# Patient Record
Sex: Male | Born: 1985 | Race: White | Hispanic: No | Smoking: Never smoker
Health system: Southern US, Community
[De-identification: ages and names within clinical notes are randomized; demographics above are authoritative.]

## PROBLEM LIST (undated history)

## (undated) DIAGNOSIS — F32A Depression, unspecified: Secondary | ICD-10-CM

## (undated) DIAGNOSIS — Z8669 Personal history of other diseases of the nervous system and sense organs: Secondary | ICD-10-CM

## (undated) DIAGNOSIS — K602 Anal fissure, unspecified: Secondary | ICD-10-CM

## (undated) DIAGNOSIS — G4730 Sleep apnea, unspecified: Secondary | ICD-10-CM

## (undated) DIAGNOSIS — K8000 Calculus of gallbladder with acute cholecystitis without obstruction: Secondary | ICD-10-CM

## (undated) DIAGNOSIS — IMO0001 Reserved for inherently not codable concepts without codable children: Secondary | ICD-10-CM

## (undated) DIAGNOSIS — Z8679 Personal history of other diseases of the circulatory system: Secondary | ICD-10-CM

## (undated) DIAGNOSIS — F909 Attention-deficit hyperactivity disorder, unspecified type: Secondary | ICD-10-CM

## (undated) DIAGNOSIS — I10 Essential (primary) hypertension: Secondary | ICD-10-CM

## (undated) DIAGNOSIS — F419 Anxiety disorder, unspecified: Secondary | ICD-10-CM

## (undated) DIAGNOSIS — E291 Testicular hypofunction: Secondary | ICD-10-CM

## (undated) DIAGNOSIS — F1910 Other psychoactive substance abuse, uncomplicated: Secondary | ICD-10-CM

## (undated) DIAGNOSIS — R030 Elevated blood-pressure reading, without diagnosis of hypertension: Secondary | ICD-10-CM

## (undated) HISTORY — PX: CARPAL TUNNEL RELEASE: SHX101

## (undated) HISTORY — DX: Other psychoactive substance abuse, uncomplicated: F19.10

## (undated) HISTORY — DX: Calculus of gallbladder with acute cholecystitis without obstruction: K80.00

## (undated) HISTORY — PX: COSMETIC SURGERY: SHX468

## (undated) HISTORY — PX: OTHER SURGICAL HISTORY: SHX169

---
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
## 2011-08-13 ENCOUNTER — Inpatient Hospital Stay (HOSPITAL_COMMUNITY): Payer: Managed Care, Other (non HMO) | Admitting: Anesthesiology

## 2011-08-13 ENCOUNTER — Encounter (HOSPITAL_COMMUNITY): Payer: Self-pay | Admitting: Emergency Medicine

## 2011-08-13 ENCOUNTER — Other Ambulatory Visit (INDEPENDENT_AMBULATORY_CARE_PROVIDER_SITE_OTHER): Payer: Self-pay | Admitting: General Surgery

## 2011-08-13 ENCOUNTER — Encounter (HOSPITAL_COMMUNITY): Payer: Self-pay | Admitting: Anesthesiology

## 2011-08-13 ENCOUNTER — Emergency Department (HOSPITAL_COMMUNITY): Payer: Managed Care, Other (non HMO)

## 2011-08-13 ENCOUNTER — Encounter (HOSPITAL_COMMUNITY): Admission: EM | Disposition: A | Discharge status: Home / Self Care | Payer: Self-pay | Source: Home / Self Care

## 2011-08-13 ENCOUNTER — Inpatient Hospital Stay (HOSPITAL_COMMUNITY): Payer: Managed Care, Other (non HMO)

## 2011-08-13 ENCOUNTER — Inpatient Hospital Stay (HOSPITAL_COMMUNITY)
Admission: EM | Admit: 2011-08-13 | Discharge: 2011-08-15 | DRG: 419 | Disposition: A | Discharge status: Home / Self Care | Payer: Managed Care, Other (non HMO) | Attending: General Surgery | Admitting: General Surgery

## 2011-08-13 DIAGNOSIS — K8010 Calculus of gallbladder with chronic cholecystitis without obstruction: Secondary | ICD-10-CM

## 2011-08-13 DIAGNOSIS — R030 Elevated blood-pressure reading, without diagnosis of hypertension: Secondary | ICD-10-CM | POA: Diagnosis present

## 2011-08-13 DIAGNOSIS — K8000 Calculus of gallbladder with acute cholecystitis without obstruction: Secondary | ICD-10-CM

## 2011-08-13 DIAGNOSIS — E669 Obesity, unspecified: Secondary | ICD-10-CM | POA: Diagnosis present

## 2011-08-13 DIAGNOSIS — R1011 Right upper quadrant pain: Secondary | ICD-10-CM

## 2011-08-13 DIAGNOSIS — I10 Essential (primary) hypertension: Secondary | ICD-10-CM | POA: Diagnosis present

## 2011-08-13 DIAGNOSIS — D72828 Other elevated white blood cell count: Secondary | ICD-10-CM

## 2011-08-13 DIAGNOSIS — K8020 Calculus of gallbladder without cholecystitis without obstruction: Secondary | ICD-10-CM

## 2011-08-13 DIAGNOSIS — Z79899 Other long term (current) drug therapy: Secondary | ICD-10-CM

## 2011-08-13 DIAGNOSIS — R109 Unspecified abdominal pain: Secondary | ICD-10-CM

## 2011-08-13 HISTORY — DX: Elevated blood-pressure reading, without diagnosis of hypertension: R03.0

## 2011-08-13 HISTORY — DX: Calculus of gallbladder with acute cholecystitis without obstruction: K80.00

## 2011-08-13 HISTORY — DX: Reserved for inherently not codable concepts without codable children: IMO0001

## 2011-08-13 HISTORY — PX: CHOLECYSTECTOMY: SHX55

## 2011-08-13 LAB — CBC
HCT: 44.8 % (ref 39.0–52.0)
Hemoglobin: 15.8 g/dL (ref 13.0–17.0)
MCH: 30.6 pg (ref 26.0–34.0)
MCHC: 35.3 g/dL (ref 30.0–36.0)
MCV: 86.7 fL (ref 78.0–100.0)
Platelets: 259 10*3/uL (ref 150–400)
RBC: 5.17 MIL/uL (ref 4.22–5.81)
RDW: 12.4 % (ref 11.5–15.5)
WBC: 13.7 10*3/uL — ABNORMAL HIGH (ref 4.0–10.5)

## 2011-08-13 LAB — COMPREHENSIVE METABOLIC PANEL
ALT: 42 U/L (ref 0–53)
AST: 20 U/L (ref 0–37)
Albumin: 3.9 g/dL (ref 3.5–5.2)
Alkaline Phosphatase: 88 U/L (ref 39–117)
BUN: 9 mg/dL (ref 6–23)
CO2: 26 mEq/L (ref 19–32)
Calcium: 9.7 mg/dL (ref 8.4–10.5)
Chloride: 103 mEq/L (ref 96–112)
Creatinine, Ser: 0.82 mg/dL (ref 0.50–1.35)
GFR calc Af Amer: >90 mL/min (ref >90)
GFR calc non Af Amer: >90 mL/min (ref >90)
Glucose, Bld: 133 mg/dL — ABNORMAL HIGH (ref 70–99)
Potassium: 4.7 mEq/L (ref 3.5–5.1)
Sodium: 138 mEq/L (ref 135–145)
Total Bilirubin: 0.3 mg/dL (ref 0.3–1.2)
Total Protein: 7.3 g/dL (ref 6.0–8.3)

## 2011-08-13 LAB — DIFFERENTIAL
Basophils Absolute: 0 10*3/uL (ref 0.0–0.1)
Basophils Relative: 0 % (ref 0–1)
Eosinophils Absolute: 0.1 10*3/uL (ref 0.0–0.7)
Eosinophils Relative: 0 % (ref 0–5)
Lymphocytes Relative: 13 % (ref 12–46)
Lymphs Abs: 1.8 10*3/uL (ref 0.7–4.0)
Monocytes Absolute: 0.8 10*3/uL (ref 0.1–1.0)
Monocytes Relative: 6 % (ref 3–12)
Neutro Abs: 10.9 10*3/uL — ABNORMAL HIGH (ref 1.7–7.7)
Neutrophils Relative %: 80 % — ABNORMAL HIGH (ref 43–77)

## 2011-08-13 LAB — MRSA PCR SCREENING: MRSA by PCR: NEGATIVE

## 2011-08-13 SURGERY — LAPAROSCOPIC CHOLECYSTECTOMY WITH INTRAOPERATIVE CHOLANGIOGRAM
Anesthesia: General | Site: Abdomen | Wound class: Contaminated

## 2011-08-13 MED ORDER — HYDROMORPHONE HCL PF 1 MG/ML IJ SOLN
1.0000 mg | Freq: Once | INTRAMUSCULAR | Status: AC
  Administered 2011-08-13: 1 mg via INTRAVENOUS
  Filled 2011-08-13: qty 1

## 2011-08-13 MED ORDER — BUPIVACAINE HCL (PF) 0.25 % IJ SOLN
INTRAMUSCULAR | Status: DC | PRN
  Administered 2011-08-13: 30 mL

## 2011-08-13 MED ORDER — MORPHINE SULFATE 2 MG/ML IJ SOLN
2.0000 mg | INTRAMUSCULAR | Status: DC | PRN
  Administered 2011-08-13: 2 mg via INTRAVENOUS
  Administered 2011-08-13: 4 mg via INTRAVENOUS
  Administered 2011-08-13: 2 mg via INTRAVENOUS
  Administered 2011-08-13: 4 mg via INTRAVENOUS
  Filled 2011-08-13 (×2): qty 1
  Filled 2011-08-13 (×2): qty 2

## 2011-08-13 MED ORDER — NEOSTIGMINE METHYLSULFATE 1 MG/ML IJ SOLN
INTRAMUSCULAR | Status: DC | PRN
  Administered 2011-08-13: 2 mg via INTRAVENOUS

## 2011-08-13 MED ORDER — ONDANSETRON HCL 4 MG/2ML IJ SOLN: 4.0000 mg | Freq: Once | INTRAMUSCULAR | Status: DC | PRN

## 2011-08-13 MED ORDER — SODIUM CHLORIDE 0.9 % IV BOLUS (SEPSIS)
1000.0000 mL | Freq: Once | INTRAVENOUS | Status: AC
  Administered 2011-08-13: 1000 mL via INTRAVENOUS

## 2011-08-13 MED ORDER — FENTANYL CITRATE 0.05 MG/ML IJ SOLN
INTRAMUSCULAR | Status: DC | PRN
  Administered 2011-08-13: 50 ug via INTRAVENOUS
  Administered 2011-08-13 (×2): 100 ug via INTRAVENOUS
  Administered 2011-08-13: 50 ug via INTRAVENOUS
  Administered 2011-08-13: 100 ug via INTRAVENOUS

## 2011-08-13 MED ORDER — ONDANSETRON HCL 4 MG/2ML IJ SOLN
4.0000 mg | Freq: Once | INTRAMUSCULAR | Status: AC
  Administered 2011-08-13: 4 mg via INTRAVENOUS
  Filled 2011-08-13: qty 2

## 2011-08-13 MED ORDER — PIPERACILLIN-TAZOBACTAM 3.375 G IVPB
3.3750 g | Freq: Three times a day (TID) | INTRAVENOUS | Status: DC
  Administered 2011-08-13 (×2): 3.375 g via INTRAVENOUS
  Filled 2011-08-13 (×5): qty 50

## 2011-08-13 MED ORDER — IOHEXOL 300 MG/ML  SOLN
INTRAMUSCULAR | Status: DC | PRN
  Administered 2011-08-13: 50 mL via INTRAVENOUS

## 2011-08-13 MED ORDER — LIDOCAINE-EPINEPHRINE 1 %-1:100000 IJ SOLN
INTRAMUSCULAR | Status: DC | PRN
  Administered 2011-08-13: 30 mL

## 2011-08-13 MED ORDER — PANTOPRAZOLE SODIUM 40 MG IV SOLR
40.0000 mg | Freq: Every day | INTRAVENOUS | Status: DC
  Administered 2011-08-13 – 2011-08-14 (×2): 40 mg via INTRAVENOUS
  Filled 2011-08-13 (×3): qty 40

## 2011-08-13 MED ORDER — HYDROMORPHONE HCL PF 1 MG/ML IJ SOLN: 0.2500 mg | INTRAMUSCULAR | Status: DC | PRN

## 2011-08-13 MED ORDER — SUCCINYLCHOLINE CHLORIDE 20 MG/ML IJ SOLN
INTRAMUSCULAR | Status: DC | PRN
  Administered 2011-08-13: 100 mg via INTRAVENOUS

## 2011-08-13 MED ORDER — HYDROCODONE-ACETAMINOPHEN 5-325 MG PO TABS
1.0000 | ORAL_TABLET | ORAL | Status: DC | PRN
  Administered 2011-08-13 – 2011-08-14 (×3): 2 via ORAL
  Filled 2011-08-13 (×3): qty 2

## 2011-08-13 MED ORDER — LACTATED RINGERS IV SOLN
INTRAVENOUS | Status: DC | PRN
  Administered 2011-08-13: 13:00:00 via INTRAVENOUS

## 2011-08-13 MED ORDER — MORPHINE SULFATE 10 MG/ML IJ SOLN
INTRAMUSCULAR | Status: DC | PRN
  Administered 2011-08-13 (×2): 5 mg via INTRAVENOUS

## 2011-08-13 MED ORDER — BIOTENE DRY MOUTH MT LIQD
15.0000 mL | Freq: Two times a day (BID) | OROMUCOSAL | Status: DC
  Administered 2011-08-13: 15 mL via OROMUCOSAL

## 2011-08-13 MED ORDER — 0.9 % SODIUM CHLORIDE (POUR BTL) OPTIME
TOPICAL | Status: DC | PRN
  Administered 2011-08-13: 1000 mL

## 2011-08-13 MED ORDER — ROCURONIUM BROMIDE 100 MG/10ML IV SOLN
INTRAVENOUS | Status: DC | PRN
  Administered 2011-08-13: 50 mg via INTRAVENOUS

## 2011-08-13 MED ORDER — MIDAZOLAM HCL 5 MG/5ML IJ SOLN
INTRAMUSCULAR | Status: DC | PRN
  Administered 2011-08-13: 2 mg via INTRAVENOUS

## 2011-08-13 MED ORDER — HEPARIN SODIUM (PORCINE) 5000 UNIT/ML IJ SOLN
5000.0000 [IU] | Freq: Three times a day (TID) | INTRAMUSCULAR | Status: DC
  Administered 2011-08-13 – 2011-08-15 (×5): 5000 [IU] via SUBCUTANEOUS
  Filled 2011-08-13 (×8): qty 1

## 2011-08-13 MED ORDER — ONDANSETRON HCL 4 MG/2ML IJ SOLN
4.0000 mg | Freq: Four times a day (QID) | INTRAMUSCULAR | Status: DC | PRN
  Administered 2011-08-13: 4 mg via INTRAVENOUS
  Filled 2011-08-13: qty 2

## 2011-08-13 MED ORDER — PROPOFOL 10 MG/ML IV EMUL
INTRAVENOUS | Status: DC | PRN
  Administered 2011-08-13: 300 mg via INTRAVENOUS

## 2011-08-13 MED ORDER — ONDANSETRON HCL 4 MG/2ML IJ SOLN
INTRAMUSCULAR | Status: DC | PRN
  Administered 2011-08-13: 4 mg via INTRAVENOUS

## 2011-08-13 MED ORDER — SODIUM CHLORIDE 0.9 % IV SOLN
1.0000 g | Freq: Once | INTRAVENOUS | Status: AC
  Administered 2011-08-13: 1 g via INTRAVENOUS
  Filled 2011-08-13: qty 1

## 2011-08-13 MED ORDER — LACTATED RINGERS IV SOLN
INTRAVENOUS | Status: DC
  Administered 2011-08-13: 12:00:00 via INTRAVENOUS

## 2011-08-13 MED ORDER — GLYCOPYRROLATE 0.2 MG/ML IJ SOLN
INTRAMUSCULAR | Status: DC | PRN
  Administered 2011-08-13: 0.2 mg via INTRAVENOUS

## 2011-08-13 MED ORDER — SODIUM CHLORIDE 0.9 % IR SOLN
Status: DC | PRN
  Administered 2011-08-13: 1000 mL

## 2011-08-13 MED ORDER — KCL IN DEXTROSE-NACL 20-5-0.45 MEQ/L-%-% IV SOLN
INTRAVENOUS | Status: DC
  Administered 2011-08-13 – 2011-08-15 (×4): via INTRAVENOUS
  Filled 2011-08-13 (×6): qty 1000

## 2011-08-13 MED ORDER — CHLORHEXIDINE GLUCONATE 0.12 % MT SOLN
15.0000 mL | Freq: Two times a day (BID) | OROMUCOSAL | Status: DC
  Administered 2011-08-13: 15 mL via OROMUCOSAL

## 2011-08-13 SURGICAL SUPPLY — 53 items
APPLIER CLIP ROT 10 11.4 M/L (STAPLE) ×2
BLADE SURG ROTATE 9660 (MISCELLANEOUS) IMPLANT
CANISTER SUCTION 2500CC (MISCELLANEOUS) ×2 IMPLANT
CATH REDDICK CHOLANGI 4FR 50CM (CATHETERS) ×2 IMPLANT
CHLORAPREP W/TINT 26ML (MISCELLANEOUS) ×2 IMPLANT
CLIP APPLIE ROT 10 11.4 M/L (STAPLE) ×1 IMPLANT
CLOTH BEACON ORANGE TIMEOUT ST (SAFETY) ×2 IMPLANT
COVER SURGICAL LIGHT HANDLE (MISCELLANEOUS) ×2 IMPLANT
DECANTER SPIKE VIAL GLASS SM (MISCELLANEOUS) ×4 IMPLANT
DERMABOND ADVANCED (GAUZE/BANDAGES/DRESSINGS) ×1
DERMABOND ADVANCED .7 DNX12 (GAUZE/BANDAGES/DRESSINGS) ×1 IMPLANT
DRAIN CHANNEL 19F RND (DRAIN) ×2 IMPLANT
DRAPE C-ARM 42X72 X-RAY (DRAPES) ×2 IMPLANT
ELECT CAUTERY BLADE 6.4 (BLADE) ×2 IMPLANT
ELECT REM PT RETURN 9FT ADLT (ELECTROSURGICAL) ×2
ELECTRODE REM PT RTRN 9FT ADLT (ELECTROSURGICAL) ×1 IMPLANT
ENDOLOOP SUT PDS II  0 18 (SUTURE) ×1
ENDOLOOP SUT PDS II 0 18 (SUTURE) ×1 IMPLANT
EVACUATOR SILICONE 100CC (DRAIN) ×2 IMPLANT
GLOVE BIO SURGEON STRL SZ7.5 (GLOVE) ×2 IMPLANT
GLOVE BIOGEL PI IND STRL 7.0 (GLOVE) ×1 IMPLANT
GLOVE BIOGEL PI IND STRL 7.5 (GLOVE) ×1 IMPLANT
GLOVE BIOGEL PI IND STRL 8 (GLOVE) ×1 IMPLANT
GLOVE BIOGEL PI INDICATOR 7.0 (GLOVE) ×1
GLOVE BIOGEL PI INDICATOR 7.5 (GLOVE) ×1
GLOVE BIOGEL PI INDICATOR 8 (GLOVE) ×1
GLOVE SS BIOGEL STRL SZ 8 (GLOVE) ×1 IMPLANT
GLOVE SUPERSENSE BIOGEL SZ 8 (GLOVE) ×1
GLOVE SURG SS PI 6.5 STRL IVOR (GLOVE) ×2 IMPLANT
GLOVE SURG SS PI 7.5 STRL IVOR (GLOVE) ×4 IMPLANT
GOWN PREVENTION PLUS XLARGE (GOWN DISPOSABLE) ×4 IMPLANT
GOWN STRL NON-REIN LRG LVL3 (GOWN DISPOSABLE) ×4 IMPLANT
IV CATH 14GX2 1/4 (CATHETERS) ×2 IMPLANT
KIT BASIN OR (CUSTOM PROCEDURE TRAY) ×2 IMPLANT
KIT ROOM TURNOVER OR (KITS) ×2 IMPLANT
NS IRRIG 1000ML POUR BTL (IV SOLUTION) ×2 IMPLANT
PAD ARMBOARD 7.5X6 YLW CONV (MISCELLANEOUS) ×2 IMPLANT
PENCIL BUTTON HOLSTER BLD 10FT (ELECTRODE) ×2 IMPLANT
POUCH SPECIMEN RETRIEVAL 10MM (ENDOMECHANICALS) ×2 IMPLANT
SCISSORS LAP 5X35 DISP (ENDOMECHANICALS) IMPLANT
SET IRRIG TUBING LAPAROSCOPIC (IRRIGATION / IRRIGATOR) ×2 IMPLANT
SLEEVE ENDOPATH XCEL 5M (ENDOMECHANICALS) ×2 IMPLANT
SPECIMEN JAR SMALL (MISCELLANEOUS) ×2 IMPLANT
SUT ETHILON 2 0 FS 18 (SUTURE) ×2 IMPLANT
SUT MNCRL AB 4-0 PS2 18 (SUTURE) ×4 IMPLANT
SUT VICRYL 0 UR6 27IN ABS (SUTURE) ×6 IMPLANT
TOWEL OR 17X24 6PK STRL BLUE (TOWEL DISPOSABLE) ×2 IMPLANT
TOWEL OR 17X26 10 PK STRL BLUE (TOWEL DISPOSABLE) ×2 IMPLANT
TRAY FOLEY CATH 14FR (SET/KITS/TRAYS/PACK) IMPLANT
TRAY LAPAROSCOPIC (CUSTOM PROCEDURE TRAY) ×2 IMPLANT
TROCAR HASSON GELL 12X100 (TROCAR) ×2 IMPLANT
TROCAR XCEL NON-BLD 11X100MML (ENDOMECHANICALS) ×2 IMPLANT
TROCAR XCEL NON-BLD 5MMX100MML (ENDOMECHANICALS) ×2 IMPLANT

## 2011-08-13 NOTE — Op Note (Signed)
NAMEMarland Kitchen  KEYSHAWN, HELLWIG NO.:  [id number]  MEDICAL RECORD NO.:  [id number]  LOCATION:  5156                         FACILITY:  [facility]  PHYSICIAN:  Lodema Pilot, MD       DATE OF BIRTH:  1986/03/10  DATE OF PROCEDURE:  08/13/2011 DATE OF DISCHARGE:                              OPERATIVE REPORT   PROCEDURE:  Laparoscopic cholecystectomy with intraoperative cholangiogram and drain placement.  PREOPERATIVE DIAGNOSIS:  Acute cholecystitis.  POSTOP DIAGNOSIS:  Acute cholecystitis.  SURGEON:  Lodema Pilot, MD  ASSISTANT:  Brayton El, PA-C  ANESTHESIA:  General endotracheal anesthesia with 26 mL of 1% lidocaine with epinephrine and 0.25% Marcaine in a 50:50 mixture.  FLUIDS:  1800 mL crystalloid.  ESTIMATED BLOOD LOSS:  75 mL.  URINE OUTPUT:  450 mL.  DRAINS:  A 19-French Blake drain placed in the gallbladder fossa.  SPECIMENS:  Gallbladder and contents sent to Pathology for permanent sectioning.  COMPLICATIONS:  None apparent.  FINDINGS:  Edematous and injected gallbladder consistent with acute cholecystitis.  Large gallstones, normal cholangiogram.  Endoloop placed around the cystic duct stump, and JP drain placed in the gallbladder fossa.  INDICATION FOR PROCEDURE:  Mr. Retana is a 26 year old male with 2-day history of right upper quadrant abdominal pain and gallstones and elevated white blood cell count and symptoms concerning for acute cholecystitis.  OPERATIVE DETAILS:  Mr. Taboada was seen and evaluated in the emergency room, and risks and benefits of the procedure were discussed in lay terms.  Informed consent was obtained, and therapeutic antibiotics were given and was taken to the operating room, placed on table in supine position.  General endotracheal anesthesia was obtained, and his abdomen was prepped and draped in standard surgical fashion.  A Foley catheter was placed, and procedure time-out was performed with all operative  team members to confirm proper patient and procedure, and then supraumbilical midline incision was made in the skin and dissection carried down to the abdominal wall fascia and into the peritoneum using blunt dissection, suture was placed to the fascia, and 12-mm Hasson port was placed. Pneumoperitoneum was obtained, and laparoscope was introduced.  There was no evidence of bowel injury upon entry.  He had a very distended and thickened gallbladder, and could not grasp the gallbladder, and so we aspirated with a needle aspirator, aspirated some of the fluid from the gallbladder, and we were able to retract the gallbladder cephalad, and they were draped with a very thick rind and thick adhesions with omentum adhered to the gallbladder.  These were taken down using blunt dissection.  The duodenum was closed, but no cautery was used in this area.  A window was created through the triangle of Calot, and the cystic duct stump was skeletonized.  We dissected this high up on the gallbladder and the cystic duct was very wide and dilated this area.  I left the grasper and placed on the gallbladder side who made a small cystic ductotomy and placed a Reddick catheter into this little ductotomy, and cholangiogram was performed, which demonstrated, but this was indeed the cystic duct and gallbladder, and he had no common bile duct stones and free flow of bile into  the duodenum.  Normal right and left hepatic ducts.  The catheter was removed, and the duct was transected and Endoloop was placed on the gallbladder side and another 0 PDS Endoloop was placed on the cystic duct stump.  Another clip was placed in this area as well marking the Endoloop, but the main otomies were cystic duct stump closure.  Then, the gallbladder was divided and removed from the gallbladder fossa using Bovie electrocautery.  He had a very thick rind, and there was some spillage of bile during the procedure, although there are  no gallstones apparently spilt.  The gallbladder was completely removed from the gallbladder fossa and placed in an EndoCatch bag and removed from the umbilical incision after enlarging the fascial incision to accommodate the large stone.  The gallbladder had some large palpable stones in it, and was sent to Pathology for permanent sectioning.  The trocar was placed back at the umbilicus and right upper quadrant was irrigated with sterile saline solution till the irrigation returned clear.  Gallbladder fossa was inspected for hemostasis, which was noted to be adequate.  The Endoloop and clips were placed in there were noted to be in good position.  The cystic artery clips were in good position, and because of the large cystic duct and an Endoloop placed around this and decided to leave a 45- [place] Blake drain in the gallbladder fossa.  This was removed through the right upper quadrant trocar site and sutured in place with a nylon drain stitch.  The umbilical fascia was approximated with interrupted 0 Vicryl sutures in open fashion, and these were secured and the abdomen re-insufflated with carbon dioxide gas and umbilical closure was noted to be adequate without any evidence of bowel injury.  Right upper quadrant was noted be adequate without any evidence of bowel injury or bile leakage or bleeding.  The right upper quadrant trocar was removed under direct visualization.  Abdominal wall was noted to be hemostatic. The epigastric trocar was removed, and the wounds were injected with a total of 26 mL of 1% lidocaine with epinephrine, 0.25% Marcaine in a 50:50 mixture.  Skin edges were approximated with 4-0 Monocryl subcuticular suture.  Skin was washed and dried and Dermabond was applied.  All sponge and instrument counts were correct at the end of the case.  The patient tolerated the procedure well without apparent complication.          ______________________________ Lodema Pilot,  MD     BL/MEDQ  D:  08/13/2011  T:  08/13/2011  Job:  [id number]

## 2011-08-13 NOTE — ED Notes (Signed)
Surgeon at bedside to speak with pt.

## 2011-08-13 NOTE — ED Provider Notes (Addendum)
History     CSN: [id number]  Arrival date & time 08/13/11  4098   First MD Initiated Contact with Patient 08/13/11 [phone]      Chief Complaint  Patient presents with  . Abdominal Pain    (Consider location/radiation/quality/duration/timing/severity/associated sxs/prior treatment) Patient is a 26 y.o. male presenting with abdominal pain. The history is provided by the patient.  Abdominal Pain The primary symptoms of the illness include abdominal pain and nausea. The primary symptoms of the illness do not include fever, shortness of breath, vomiting or diarrhea. The current episode started 6 to 12 hours ago. The onset of the illness was sudden. The problem has been rapidly worsening.  The abdominal pain has been rapidly worsening since its onset. The abdominal pain is located in the RUQ. Pain radiation: upper back. The severity of the abdominal pain is 9/10. The abdominal pain is relieved by nothing. The abdominal pain is exacerbated by eating and coughing.  Associated with: started after eating spaghetti. Additional symptoms associated with the illness include chills, anorexia and diaphoresis. Significant associated medical issues include gallstones.    No past medical history on file.  No past surgical history on file.  No family history on file.  History  Substance Use Topics  . Smoking status: Not on file  . Smokeless tobacco: Not on file  . Alcohol Use: Not on file      Review of Systems  Constitutional: Positive for chills and diaphoresis. Negative for fever.  Respiratory: Negative for shortness of breath.   Gastrointestinal: Positive for nausea, abdominal pain and anorexia. Negative for vomiting and diarrhea.  All other systems reviewed and are negative.    Allergies  Review of patient's allergies indicates not on file.  Home Medications  No current outpatient prescriptions on file.  BP 160/85  Pulse 91  Temp(Src) 97.4 F (36.3 C) (Oral)  Resp 16  SpO2  99%  Physical Exam  Nursing note and vitals reviewed. Constitutional: He is oriented to person, place, and time. He appears well-developed and well-nourished. No distress.  HENT:  Head: Normocephalic and atraumatic.  Mouth/Throat: Oropharynx is clear and moist.  Eyes: Conjunctivae and EOM are normal. Pupils are equal, round, and reactive to light.  Neck: Normal range of motion. Neck supple.  Cardiovascular: Normal rate, regular rhythm and intact distal pulses.   No murmur heard. Pulmonary/Chest: Effort normal and breath sounds normal. No respiratory distress. He has no wheezes. He has no rales.  Abdominal: Soft. Bowel sounds are normal. He exhibits no distension. There is tenderness in the right upper quadrant. There is guarding and positive Murphy's sign. There is no rebound and no CVA tenderness.  Musculoskeletal: Normal range of motion. He exhibits no edema and no tenderness.  Neurological: He is alert and oriented to person, place, and time.  Skin: Skin is warm. No rash noted. He is diaphoretic. No erythema.  Psychiatric: He has a normal mood and affect. His behavior is normal.    ED Course  Procedures (including critical care time)  Labs Reviewed  CBC - Abnormal; Notable for the following:    WBC 13.7 (*)    All other components within normal limits  DIFFERENTIAL - Abnormal; Notable for the following:    Neutrophils Relative 80 (*)    Neutro Abs 10.9 (*)    All other components within normal limits  COMPREHENSIVE METABOLIC PANEL - Abnormal; Notable for the following:    Glucose, Bld 133 (*)    All other components within normal  limits   US Abdomen Complete  08/13/2011  *RADIOLOGY REPORT*  Clinical Data:  Abdominal pain.  ABDOMINAL ULTRASOUND COMPLETE  Comparison:  None  Findings:  Gallbladder:  A 1.9 cm stone is noted at the fundus of the gallbladder.  The gallbladder appears distended.  No gallbladder wall thickening or pericholecystic fluid is seen.  No ultrasonographic  Murphy's sign is elicited.  Common Bile Duct:  0.8 cm in diameter; mildly prominent in caliber; a distal obstructing stone cannot be excluded.  Liver:  Mildly increased parenchymal echogenicity and coarsened echotexture, compatible with fatty infiltration; no focal lesions identified.  Limited Doppler evaluation demonstrates normal blood flow within the liver.  IVC:  Unremarkable in appearance.  Pancreas:  Although the pancreas is difficult to visualize in its entirety due to overlying bowel gas, no focal pancreatic abnormality is identified.  Spleen:  10.3 cm in length; within normal limits in size and echotexture.  Right kidney:  11.5 cm in length; normal in size, configuration and parenchymal echogenicity.  No evidence of mass or hydronephrosis.  Left kidney:  12.5 cm in length; normal in size, configuration and parenchymal echogenicity.  No evidence of mass or hydronephrosis.  Abdominal Aorta:  Normal in caliber; no aneurysm identified.  IMPRESSION:  1.  Distended gallbladder noted; common hepatic duct mildly distended, measuring 0.8 cm.  Distal obstruction cannot be excluded, though no ultrasonographic Murphy's sign is elicited. 2.  Cholelithiasis; no evidence for cholecystitis. 3.  Diffuse fatty infiltration within the liver.  Original Report Authenticated By: Tonia Ghent, M.D.     1. Cholelithiasis   2. Abdominal  pain, other specified site       MDM   Patient with abrupt onset of abdominal pain today after eating with a past history of gallstones. Concern for cholecystitis. Patient has a positive Murphy sign and severe right upper quadrant pain. Vital signs are stable and patient is otherwise healthy. CBC, CMP, ultrasound and  5:18 AM Pt still hurting now a 6/10 down from 8/10.  More medication given.  Leukocytosis of 13.7 but otherwise normal labs. U/s with evidence of gallstone but no cholecystitis but can't r/o distal obstruction.    6:04 AM Reevaluation the patient's pain is down to  3-4/10 but is still tender with palpation. Will discuss patient's case with surgery.    Gwyneth Sprout, MD 08/13/11 1610  Gwyneth Sprout, MD 08/13/11 [id number]  Gwyneth Sprout, MD 08/13/11 [phone]

## 2011-08-13 NOTE — Brief Op Note (Signed)
08/13/2011  3:19 PM  PATIENT:  John Faulkner  26 y.o. male  PRE-OPERATIVE DIAGNOSIS:  Gallstones.  POST-OPERATIVE DIAGNOSIS:  Gallstones.  PROCEDURE:  Procedure(s) (LRB): LAPAROSCOPIC CHOLECYSTECTOMY WITH INTRAOPERATIVE CHOLANGIOGRAM (N/A)  SURGEON:  Surgeon(s) and Role:    * Rulon Abide, DO - Primary  PHYSICIAN ASSISTANT:   ASSISTANTS: kevin bruning   ANESTHESIA:   general  EBL:  Total I/O In: 1800 [I.V.:1800] Out: 525 [Urine:450; Blood:75]  BLOOD ADMINISTERED:none  DRAINS: (72F) Jackson-Pratt drain(s) with closed bulb suction in the GB fossa   LOCAL MEDICATIONS USED:  MARCAINE    and LIDOCAINE   SPECIMEN:  Source of Specimen:  gallbladder  DISPOSITION OF SPECIMEN:  PATHOLOGY  COUNTS:  YES  TOURNIQUET:  * No tourniquets in log *  DICTATION: .Other Dictation: Dictation Number [id number]  PLAN OF CARE: Admit to inpatient   PATIENT DISPOSITION:  PACU - hemodynamically stable.   Delay start of Pharmacological VTE agent (>24hrs) due to surgical blood loss or risk of bleeding: no

## 2011-08-13 NOTE — ED Notes (Signed)
Report received, assumed care.  

## 2011-08-13 NOTE — Anesthesia Procedure Notes (Signed)
Procedure Name: Intubation Date/Time: 08/13/2011 1:01 PM Performed by: Leona Singleton A. Patient Re-evaluated:Patient Re-evaluated prior to inductionOxygen Delivery Method: Circle System Utilized Preoxygenation: Pre-oxygenation with 100% oxygen Intubation Type: IV induction, Rapid sequence and Cricoid Pressure applied Ventilation: Mask ventilation without difficulty Laryngoscope Size: Miller and 2 Grade View: Grade I Tube type: Oral Airway Equipment and Method: stylet Placement Confirmation: ETT inserted through vocal cords under direct vision,  positive ETCO2 and breath sounds checked- equal and bilateral Secured at: 22 cm Tube secured with: Tape Dental Injury: Teeth and Oropharynx as per pre-operative assessment

## 2011-08-13 NOTE — Preoperative (Signed)
Beta Blockers   Reason not to administer Beta Blockers:Not Applicable 

## 2011-08-13 NOTE — Progress Notes (Signed)
UR of chart completed.  

## 2011-08-13 NOTE — Plan of Care (Signed)
Problem: Diagnosis - Type of Surgery Goal: General Surgical Patient Education (See Patient Education module for education specifics) Laparoscopic Cholecystectomy

## 2011-08-13 NOTE — H&P (Addendum)
John Faulkner is an 26 y.o. male.   Chief Complaint: right upper quadrant HPI: 26 year old obese Caucasian male presents to the emergency room complaining of abdominal pain. He states that the pain started at 7 PM last night. The pain has been constant since that time. The pain initially started in his epigastric region and radiated to his right upper quadrant. It also extends to his back. The patient states that he had a similar episode in February 2012 prompting him to the emergency room where he was diagnosed with gallstones. He reports some subjective fevers and chills. He did have some nausea yesterday. He denies any weight loss, jaundice, diarrhea, constipation, melena, hematochezia. He states that if he is not moving the pain is about a 4/10; however, if he moves the pain is very intense. He denies any acholic stools. He denies any difficulty swallowing  Past Medical History  Diagnosis Date  . Elevated blood pressure     History reviewed. No pertinent past surgical history.  History reviewed. No pertinent family history. Social History:  reports that he has never smoked. He does not have any smokeless tobacco history on file. He reports that he does not drink alcohol or use illicit drugs.  Allergies: No Known Allergies  Medications Prior to Admission  Medication Dose Route Frequency Provider Last Rate Last Dose  . HYDROmorphone (DILAUDID) injection 1 mg  1 mg Intravenous Once Gwyneth Sprout, MD   1 mg at 08/13/11 0344  . HYDROmorphone (DILAUDID) injection 1 mg  1 mg Intravenous Once Gwyneth Sprout, MD   1 mg at 08/13/11 0525  . ondansetron (ZOFRAN) injection 4 mg  4 mg Intravenous Once Gwyneth Sprout, MD   4 mg at 08/13/11 0344  . ondansetron (ZOFRAN) injection 4 mg  4 mg Intravenous Once Gwyneth Sprout, MD   4 mg at 08/13/11 0525  . sodium chloride 0.9 % bolus 1,000 mL  1,000 mL Intravenous Once Gwyneth Sprout, MD   1,000 mL at 08/13/11 0350   No current outpatient  prescriptions on file as of 08/13/2011.    Results for orders placed during the hospital encounter of 08/13/11 (from the past 48 hour(s))  CBC     Status: Abnormal   Collection Time   08/13/11  3:31 AM      Component Value Range Comment   WBC 13.7 (*) 4.0 - 10.5 (K/uL)    RBC 5.17  4.22 - 5.81 (MIL/uL)    Hemoglobin 15.8  13.0 - 17.0 (g/dL)    HCT 78.4  69.6 - 29.5 (%)    MCV 86.7  78.0 - 100.0 (fL)    MCH 30.6  26.0 - 34.0 (pg)    MCHC 35.3  30.0 - 36.0 (g/dL)    RDW 28.4  13.2 - 44.0 (%)    Platelets 259  150 - 400 (K/uL)   DIFFERENTIAL     Status: Abnormal   Collection Time   08/13/11  3:31 AM      Component Value Range Comment   Neutrophils Relative 80 (*) 43 - 77 (%)    Neutro Abs 10.9 (*) 1.7 - 7.7 (K/uL)    Lymphocytes Relative 13  12 - 46 (%)    Lymphs Abs 1.8  0.7 - 4.0 (K/uL)    Monocytes Relative 6  3 - 12 (%)    Monocytes Absolute 0.8  0.1 - 1.0 (K/uL)    Eosinophils Relative 0  0 - 5 (%)    Eosinophils Absolute 0.1  0.0 -  0.7 (K/uL)    Basophils Relative 0  0 - 1 (%)    Basophils Absolute 0.0  0.0 - 0.1 (K/uL)   COMPREHENSIVE METABOLIC PANEL     Status: Abnormal   Collection Time   08/13/11  3:31 AM      Component Value Range Comment   Sodium 138  135 - 145 (mEq/L)    Potassium 4.7  3.5 - 5.1 (mEq/L)    Chloride 103  96 - 112 (mEq/L)    CO2 26  19 - 32 (mEq/L)    Glucose, Bld 133 (*) 70 - 99 (mg/dL)    BUN 9  6 - 23 (mg/dL)    Creatinine, Ser 1.61  0.50 - 1.35 (mg/dL)    Calcium 9.7  8.4 - 10.5 (mg/dL)    Total Protein 7.3  6.0 - 8.3 (g/dL)    Albumin 3.9  3.5 - 5.2 (g/dL)    AST 20  0 - 37 (U/L)    ALT 42  0 - 53 (U/L)    Alkaline Phosphatase 88  39 - 117 (U/L)    Total Bilirubin 0.3  0.3 - 1.2 (mg/dL)    GFR calc non Af Amer >90  >90 (mL/min)    GFR calc Af Amer >90  >90 (mL/min)    US Abdomen Complete  08/13/2011  *RADIOLOGY REPORT*  Clinical Data:  Abdominal pain.  ABDOMINAL ULTRASOUND COMPLETE  Comparison:  None  Findings:  Gallbladder:  A 1.9 cm  stone is noted at the fundus of the gallbladder.  The gallbladder appears distended.  No gallbladder wall thickening or pericholecystic fluid is seen.  No ultrasonographic Murphy's sign is elicited.  Common Bile Duct:  0.8 cm in diameter; mildly prominent in caliber; a distal obstructing stone cannot be excluded.  Liver:  Mildly increased parenchymal echogenicity and coarsened echotexture, compatible with fatty infiltration; no focal lesions identified.  Limited Doppler evaluation demonstrates normal blood flow within the liver.  IVC:  Unremarkable in appearance.  Pancreas:  Although the pancreas is difficult to visualize in its entirety due to overlying bowel gas, no focal pancreatic abnormality is identified.  Spleen:  10.3 cm in length; within normal limits in size and echotexture.  Right kidney:  11.5 cm in length; normal in size, configuration and parenchymal echogenicity.  No evidence of mass or hydronephrosis.  Left kidney:  12.5 cm in length; normal in size, configuration and parenchymal echogenicity.  No evidence of mass or hydronephrosis.  Abdominal Aorta:  Normal in caliber; no aneurysm identified.  IMPRESSION:  1.  Distended gallbladder noted; common hepatic duct mildly distended, measuring 0.8 cm.  Distal obstruction cannot be excluded, though no ultrasonographic Murphy's sign is elicited. 2.  Cholelithiasis; no evidence for cholecystitis. 3.  Diffuse fatty infiltration within the liver.  Original Report Authenticated By: Tonia Ghent, M.D.    Review of Systems  Constitutional: Positive for fever and chills. Negative for weight loss and diaphoresis.  HENT: Negative for hearing loss.   Eyes: Negative for double vision and pain.  Respiratory: Negative for shortness of breath.   Cardiovascular: Negative for chest pain, palpitations, orthopnea and leg swelling.  Gastrointestinal: Positive for nausea and abdominal pain. Negative for vomiting, diarrhea, constipation, blood in stool and melena.    Genitourinary: Negative for dysuria, urgency and frequency.  Musculoskeletal: Negative.   Skin: Negative for itching and rash.  Neurological: Negative.  Negative for weakness and headaches.  Endo/Heme/Allergies: Negative.   Psychiatric/Behavioral: Negative.     Blood pressure 133/69,  pulse 63, temperature 97.4 F (36.3 C), temperature source Oral, resp. rate 16, height 6\' 1"  (1.854 m), weight 290 lb (131.543 kg), SpO2 96.00%. Physical Exam  Vitals reviewed. Constitutional: He appears well-developed and well-nourished. He is cooperative. He appears ill.       obese  HENT:  Head: Normocephalic and atraumatic.  Right Ear: External ear normal.  Left Ear: External ear normal.  Nose: Nose normal.  Eyes: Conjunctivae are normal. No scleral icterus.  Neck: Normal range of motion. Neck supple. No tracheal deviation present. No thyromegaly present.  Cardiovascular: Normal rate, regular rhythm and normal heart sounds.   Respiratory: Effort normal and breath sounds normal. No respiratory distress. He has no wheezes.  GI: Soft. Bowel sounds are normal. He exhibits no distension. There is tenderness in the right upper quadrant and epigastric area. There is positive Murphy's sign. There is no rigidity, no rebound and no guarding.  Musculoskeletal: Normal range of motion. He exhibits no edema and no tenderness.  Lymphadenopathy:    He has no cervical adenopathy.  Neurological: He is alert. He exhibits normal muscle tone.  Skin: Skin is warm and dry. No rash noted. No erythema.  Psychiatric: He has a normal mood and affect. His behavior is normal. Judgment and thought content normal.     Assessment/Plan Obesity Elevated blood pressure Symptomatic cholelithiasis probable acute cholecystitis  rec admit, npo, ivf, iv pain meds, iv abx for cholecystitis.  Cholecystectomy later today by Dr Biagio Quint  We discussed gallbladder disease. The patient was given Agricultural engineer. We discussed  non-operative and operative management.   I discussed laparoscopic cholecystectomy in detail.  The patient was given diagrams detailing the procedure.  We discussed the risks and benefits of a laparoscopic cholecystectomy including, but not limited to bleeding, infection, injury to surrounding structures such as the intestine or liver, bile leak, retained gallstones, need to convert to an open procedure, prolonged diarrhea, blood clots such as  DVT, common bile duct injury, anesthesia risks, and possible need for additional procedures.  We discussed the typical post-operative recovery course. I explained that the likelihood of improvement of their symptoms is good.  Mary Sella. Andrey Campanile, MD, FACS General, Bariatric, & Minimally Invasive Surgery [facility], [place]    [facility] M 08/13/2011, 7:01 AM  Pt seen and examined in ER and in preop area.  I agree with Dr. Andrey Campanile that this is most likely acute cholecystitis.  The risks of infection, bleeding, pain, persistent symptoms, scarring, injury to bowel or bile ducts, retained stone, diarrhea, need for additional procedures, and need for open surgery discussed with the patient. He expressed understanding and desires to proceed with lap chole possible open surgery.

## 2011-08-13 NOTE — Anesthesia Preprocedure Evaluation (Signed)
Anesthesia Evaluation  Patient identified by MRN, date of birth, ID band Patient awake    Reviewed: Allergy & Precautions, H&P , NPO status , Patient's Chart, lab work & pertinent test results  Airway Mallampati: II TM Distance: >3 FB Neck ROM: Full    Dental  (+) Teeth Intact   Pulmonary neg pulmonary ROS,  clear to auscultation        Cardiovascular neg cardio ROS Regular Normal    Neuro/Psych    GI/Hepatic negative GI ROS,   Endo/Other    Renal/GU      Musculoskeletal   Abdominal (+) obese,  Abdomen: soft and tender.    Peds  Hematology   Anesthesia Other Findings   Reproductive/Obstetrics                           Anesthesia Physical Anesthesia Plan  ASA: III  Anesthesia Plan: General   Post-op Pain Management:    Induction: Intravenous  Airway Management Planned: Oral ETT  Additional Equipment:   Intra-op Plan:   Post-operative Plan: Extubation in OR  Informed Consent: I have reviewed the patients History and Physical, chart, labs and discussed the procedure including the risks, benefits and alternatives for the proposed anesthesia with the patient or authorized representative who has indicated his/her understanding and acceptance.   Dental advisory given  Plan Discussed with: CRNA and Surgeon  Anesthesia Plan Comments: (26 y/o WM with Symptomatic cholelithiasis Obesity  Plan GA  Kipp Brood)        Anesthesia Quick Evaluation

## 2011-08-13 NOTE — Transfer of Care (Signed)
Immediate Anesthesia Transfer of Care Note  Patient: John Faulkner  Procedure(s) Performed: Procedure(s) (LRB): LAPAROSCOPIC CHOLECYSTECTOMY WITH INTRAOPERATIVE CHOLANGIOGRAM (N/A)  Patient Location: PACU  Anesthesia Type: General  Level of Consciousness: awake, alert , oriented and patient cooperative  Airway & Oxygen Therapy: Patient Spontanous Breathing and Patient connected to nasal cannula oxygen  Post-op Assessment: Report given to PACU RN, Post -op Vital signs reviewed and stable and Patient moving all extremities X 4  Post vital signs: Reviewed and stable  Complications: No apparent anesthesia complications

## 2011-08-13 NOTE — Anesthesia Postprocedure Evaluation (Signed)
Anesthesia Post Note  Patient: John Faulkner  Procedure(s) Performed: Procedure(s) (LRB): LAPAROSCOPIC CHOLECYSTECTOMY WITH INTRAOPERATIVE CHOLANGIOGRAM (N/A)  Anesthesia type: general  Patient location: PACU  Post pain: Pain level controlled  Post assessment: Patient's Cardiovascular Status Stable  Last Vitals:  Filed Vitals:   08/13/11 1515  BP: 152/76  Pulse: 67  Temp:   Resp: 16    Post vital signs: Reviewed and stable  Level of consciousness: sedated  Complications: No apparent anesthesia complications

## 2011-08-13 NOTE — Progress Notes (Signed)
Received from PACU post lap cholecystectomy. Arousable, not in any distress, VSS, incision intact. Will continue to monitor.

## 2011-08-13 NOTE — ED Notes (Signed)
Started having pain around right lower abdominal area last night by 7pm and it keep getting worse. Took some med that given to me last year when I had the same experience and it did not help me. Stated was nauseated also.

## 2011-08-13 NOTE — ED Notes (Signed)
Pt returned from US

## 2011-08-14 ENCOUNTER — Encounter (HOSPITAL_COMMUNITY): Payer: Self-pay | Admitting: General Surgery

## 2011-08-14 LAB — CBC
HCT: 42.1 % (ref 39.0–52.0)
Hemoglobin: 14.1 g/dL (ref 13.0–17.0)
MCH: 30.1 pg (ref 26.0–34.0)
MCHC: 33.5 g/dL (ref 30.0–36.0)
MCV: 90 fL (ref 78.0–100.0)
Platelets: 207 10*3/uL (ref 150–400)
RBC: 4.68 MIL/uL (ref 4.22–5.81)
RDW: 12.8 % (ref 11.5–15.5)
WBC: 9.2 10*3/uL (ref 4.0–10.5)

## 2011-08-14 LAB — COMPREHENSIVE METABOLIC PANEL
ALT: 66 U/L — ABNORMAL HIGH (ref 0–53)
AST: 33 U/L (ref 0–37)
Albumin: 3.2 g/dL — ABNORMAL LOW (ref 3.5–5.2)
Alkaline Phosphatase: 78 U/L (ref 39–117)
BUN: 6 mg/dL (ref 6–23)
CO2: 29 mEq/L (ref 19–32)
Calcium: 9.1 mg/dL (ref 8.4–10.5)
Chloride: 104 mEq/L (ref 96–112)
Creatinine, Ser: 0.93 mg/dL (ref 0.50–1.35)
GFR calc Af Amer: >90 mL/min (ref >90)
GFR calc non Af Amer: >90 mL/min (ref >90)
Glucose, Bld: 118 mg/dL — ABNORMAL HIGH (ref 70–99)
Potassium: 4 mEq/L (ref 3.5–5.1)
Sodium: 139 mEq/L (ref 135–145)
Total Bilirubin: 0.8 mg/dL (ref 0.3–1.2)
Total Protein: 6.3 g/dL (ref 6.0–8.3)

## 2011-08-14 NOTE — Progress Notes (Signed)
1 Day Post-Op  Subjective: Pt ok. SOre as expected. Tol clears well. No N/V  Objective: Vital signs in last 24 hours: Temp:  [97.4 F (36.3 C)-99 F (37.2 C)] 98.5 F (36.9 C) (02/19 0516) Pulse Rate:  [58-100] 80  (02/19 0516) Resp:  [16-18] 16  (02/19 0516) BP: (124-162)/(62-85) 137/66 mmHg (02/19 0516) SpO2:  [96 %-100 %] 97 % (02/19 0516) Last BM Date: 08/13/11  Intake/Output this shift:    Physical Exam: BP 137/66  Pulse 80  Temp(Src) 98.5 F (36.9 C) (Oral)  Resp 16  Ht 6\' 1"  (1.854 m)  Wt 131.543 kg (290 lb)  BMI 38.26 kg/m2  SpO2 97% Lungs: CTA without w/r/r Heart: Regular Abdomen: soft, ND, appropriately tender   Incisions all c/d/i without erythema or hematoma.   Drain intact, SS output, no bile. Ext: No edema or tenderness   Labs: CBC  Basename 08/14/11 0552 08/13/11 0331  WBC 9.2 13.7*  HGB 14.1 15.8  HCT 42.1 44.8  PLT 207 259   BMET  Basename 08/14/11 0552 08/13/11 0331  NA 139 138  K 4.0 4.7  CL 104 103  CO2 29 26  GLUCOSE 118* 133*  BUN 6 9  CREATININE 0.93 0.82  CALCIUM 9.1 9.7   LFT  Basename 08/14/11 0552  PROT 6.3  ALBUMIN 3.2*  AST 33  ALT 66*  ALKPHOS 78  BILITOT 0.8  BILIDIR --  IBILI --  LIPASE --   PT/INR No results found for this basename: LABPROT:2,INR:2 in the last 72 hours ABG No results found for this basename: PHART:2,PCO2:2,PO2:2,HCO3:2 in the last 72 hours  Studies/Results: Dg Cholangiogram Operative  08/13/2011  *RADIOLOGY REPORT*  Clinical Data:   Gallstones.  INTRAOPERATIVE CHOLANGIOGRAM  Technique:  Cholangiographic images from the C-arm fluoroscopic device were submitted for interpretation post-operatively.  Please see the procedural report for the amount of contrast and the fluoroscopy time utilized.  Comparison:  Ultrasound of the abdomen 08/13/2011  Findings:  The gallbladder has been removed and the cystic duct cannulated.  There is good opacification of the biliary tree.  No retained biliary  stones are seen.  There is good excretion into the duodenum.  IMPRESSION: Negative operative cholangiogram.  Original Report Authenticated By: Elsie Stain, M.D.   US Abdomen Complete  08/13/2011  *RADIOLOGY REPORT*  Clinical Data:  Abdominal pain.  ABDOMINAL ULTRASOUND COMPLETE  Comparison:  None  Findings:  Gallbladder:  A 1.9 cm stone is noted at the fundus of the gallbladder.  The gallbladder appears distended.  No gallbladder wall thickening or pericholecystic fluid is seen.  No ultrasonographic Murphy's sign is elicited.  Common Bile Duct:  0.8 cm in diameter; mildly prominent in caliber; a distal obstructing stone cannot be excluded.  Liver:  Mildly increased parenchymal echogenicity and coarsened echotexture, compatible with fatty infiltration; no focal lesions identified.  Limited Doppler evaluation demonstrates normal blood flow within the liver.  IVC:  Unremarkable in appearance.  Pancreas:  Although the pancreas is difficult to visualize in its entirety due to overlying bowel gas, no focal pancreatic abnormality is identified.  Spleen:  10.3 cm in length; within normal limits in size and echotexture.  Right kidney:  11.5 cm in length; normal in size, configuration and parenchymal echogenicity.  No evidence of mass or hydronephrosis.  Left kidney:  12.5 cm in length; normal in size, configuration and parenchymal echogenicity.  No evidence of mass or hydronephrosis.  Abdominal Aorta:  Normal in caliber; no aneurysm identified.  IMPRESSION:  1.  Distended gallbladder noted; common hepatic duct mildly distended, measuring 0.8 cm.  Distal obstruction cannot be excluded, though no ultrasonographic Murphy's sign is elicited. 2.  Cholelithiasis; no evidence for cholecystitis. 3.  Diffuse fatty infiltration within the liver.  Original Report Authenticated By: Tonia Ghent, M.D.    Assessment: Principal Problem:  *Acute calculous cholecystitis Active Problems:  Elevated blood pressure    Procedure(s): LAPAROSCOPIC CHOLECYSTECTOMY WITH INTRAOPERATIVE CHOLANGIOGRAM  Plan: Labs ok. WIll continue Abx Advance diet today Watch drain output Increase OOB/IS use Anticipate ready for DC tomorrow.  LOS: 1 day    Alyse Low 08/14/2011 8:09 AM  Advance diet as tolerated and plan for discharge tomorrow.

## 2011-08-14 NOTE — Plan of Care (Signed)
Problem: Diagnosis - Type of Surgery Goal: General Surgical Patient Education (See Patient Education module for education specifics)  Outcome: Completed/Met Date Met:  08/14/11 Lap chole with IOC

## 2011-08-15 MED ORDER — HYDROCODONE-ACETAMINOPHEN 5-325 MG PO TABS: 1.0000 | ORAL_TABLET | ORAL | Status: AC | PRN

## 2011-08-15 NOTE — Progress Notes (Signed)
2 Days Post-Op  Subjective: Pt ok. Feeling better this am. No N/V, tol reg diet so far. +flatus.  Objective: Vital signs in last 24 hours: Temp:  [98.1 F (36.7 C)-98.6 F (37 C)] 98.3 F (36.8 C) (02/20 0522) Pulse Rate:  [79-100] 79  (02/20 0522) Resp:  [18-20] 18  (02/20 0522) BP: (103-120)/(50-66) 118/51 mmHg (02/20 0522) SpO2:  [95 %-99 %] 99 % (02/20 0522) Last BM Date: 08/13/11  Intake/Output this shift:    Physical Exam: BP 118/51  Pulse 79  Temp(Src) 98.3 F (36.8 C) (Oral)  Resp 18  Ht 6\' 1"  (1.854 m)  Wt 131.543 kg (290 lb)  BMI 38.26 kg/m2  SpO2 99% Lungs: CTA without w/r/r Heart: Regular Abdomen: soft, ND, appropriately tender   Incisions all c/d/i without erythema or hematoma.   Drain intact, no bile, only 45 cc serous output. Ext: No edema or tenderness   Labs: CBC  Basename 08/14/11 0552 08/13/11 0331  WBC 9.2 13.7*  HGB 14.1 15.8  HCT 42.1 44.8  PLT 207 259   BMET  Basename 08/14/11 0552 08/13/11 0331  NA 139 138  K 4.0 4.7  CL 104 103  CO2 29 26  GLUCOSE 118* 133*  BUN 6 9  CREATININE 0.93 0.82  CALCIUM 9.1 9.7   LFT  Basename 08/14/11 0552  PROT 6.3  ALBUMIN 3.2*  AST 33  ALT 66*  ALKPHOS 78  BILITOT 0.8  BILIDIR --  IBILI --  LIPASE --   PT/INR No results found for this basename: LABPROT:2,INR:2 in the last 72 hours ABG No results found for this basename: PHART:2,PCO2:2,PO2:2,HCO3:2 in the last 72 hours  Studies/Results: Dg Cholangiogram Operative  08/13/2011  *RADIOLOGY REPORT*  Clinical Data:   Gallstones.  INTRAOPERATIVE CHOLANGIOGRAM  Technique:  Cholangiographic images from the C-arm fluoroscopic device were submitted for interpretation post-operatively.  Please see the procedural report for the amount of contrast and the fluoroscopy time utilized.  Comparison:  Ultrasound of the abdomen 08/13/2011  Findings:  The gallbladder has been removed and the cystic duct cannulated.  There is good opacification of the  biliary tree.  No retained biliary stones are seen.  There is good excretion into the duodenum.  IMPRESSION: Negative operative cholangiogram.  Original Report Authenticated By: Elsie Stain, M.D.    Assessment: Principal Problem:  *Acute calculous cholecystitis Active Problems:  Elevated blood pressure   Procedure(s): LAPAROSCOPIC CHOLECYSTECTOMY WITH INTRAOPERATIVE CHOLANGIOGRAM  Plan: Labs normal. Increase activity. Prob DC later today, will ask MD if drain can be removed prior to DC.  LOS: 2 days    Alyse Low 08/15/2011 8:22 AM  Tolerating diet and pain controlled.  Will plan for discharge later today.

## 2011-08-15 NOTE — Discharge Instructions (Signed)
[facility] ______[facility] °LAPAROSCOPIC SURGERY: POST OP INSTRUCTIONS °Always review your discharge instruction sheet given to you by the facility where your surgery was performed. °IF YOU HAVE DISABILITY OR FAMILY LEAVE FORMS, YOU MUST BRING THEM TO THE OFFICE FOR PROCESSING.   °DO NOT GIVE THEM TO YOUR DOCTOR. ° °1. A prescription for pain medication may be given to you upon discharge.  Take your pain medication as prescribed, if needed.  If narcotic pain medicine is not needed, then you may take acetaminophen (Tylenol) or ibuprofen (Advil) as needed. °2. Take your usually prescribed medications unless otherwise directed. °3. If you need a refill on your pain medication, please contact your pharmacy.  They will contact our office to request authorization. Prescriptions will not be filled after 5pm or on week-ends. °4. You should follow a light diet the first few days after arrival home, such as soup and crackers, etc.  Be sure to include lots of fluids daily. °5. Most patients will experience some swelling and bruising in the area of the incisions.  Ice packs will help.  Swelling and bruising can take several days to resolve.  °6. It is common to experience some constipation if taking pain medication after surgery.  Increasing fluid intake and taking a stool softener (such as Colace) will usually help or prevent this problem from occurring.  A mild laxative (Milk of Magnesia or Miralax) should be taken according to package instructions if there are no bowel movements after 48 hours. °7. Unless discharge instructions indicate otherwise, you may remove your bandages 24-48 hours after surgery, and you may shower at that time.  You may have steri-strips (small skin tapes) in place directly over the incision.  These strips should be left on the skin for 7-10 days.  If your surgeon used skin glue on the incision, you may shower in 24 hours.  The glue will flake off over the next 2-3 weeks.  Any sutures or  staples will be removed at the office during your follow-up visit. °8. ACTIVITIES:  You may resume regular (light) daily activities beginning the next day--such as daily self-care, walking, climbing stairs--gradually increasing activities as tolerated.  You may have sexual intercourse when it is comfortable.  Refrain from any heavy lifting or straining until approved by your doctor. °a. You may drive when you are no longer taking prescription pain medication, you can comfortably wear a seatbelt, and you can safely maneuver your car and apply brakes. °b. RETURN TO WORK:  __________________________________________________________ °9. You should see your doctor in the office for a follow-up appointment approximately 2-3 weeks after your surgery.  Make sure that you call for this appointment within a day or two after you arrive home to insure a convenient appointment time. °10. OTHER INSTRUCTIONS: __________________________________________________________________________________________________________________________ __________________________________________________________________________________________________________________________ °WHEN TO CALL YOUR DOCTOR: °1. Fever over 101.0 °2. Inability to urinate °3. Continued bleeding from incision. °4. Increased pain, redness, or drainage from the incision. °5. Increasing abdominal pain ° °The clinic staff is available to answer your questions during regular business hours.  Please don’t hesitate to call and ask to speak to one of the nurses for clinical concerns.  If you have a medical emergency, go to the nearest emergency room or call 911.  A surgeon from [facility] is always on call at the hospital. °[address], [address], [place], [place]  [address] ? [address] [address], [place], [place]   [address] °[phone] ? [phone] ? FAX [phone] °Web site:   [email or web address] °

## 2011-08-15 NOTE — Progress Notes (Signed)
Discharge home. Home discharge instruction given , no question verbalized. JP out, tube intact, tolerated well. Patient is alert and oriented, not in any distress, ambulatory.

## 2011-08-15 NOTE — Discharge Summary (Signed)
Physician Discharge Summary  Patient ID: John Faulkner MRN: [id number] DOB/AGE: 19-Apr-1986 26 y.o.  Admit date: 08/13/2011 Discharge date: 08/15/2011  Admission Diagnoses: Principal Problem:  *Acute calculous cholecystitis Active Problems:  Elevated blood pressure Discharge Diagnoses:  Principal Problem:  *Acute calculous cholecystitis Active Problems:  Elevated blood pressure   Procedures: Procedure(s): LAPAROSCOPIC CHOLECYSTECTOMY WITH INTRAOPERATIVE CHOLANGIOGRAM  Discharged Condition: good  Hospital Course: HPI: 26 year old obese Caucasian male presents to the emergency room complaining of abdominal pain. He states that the pain started at 7 PM last night. The pain has been constant since that time. The pain initially started in his epigastric region and radiated to his right upper quadrant. It also extends to his back. The patient states that he had a similar episode in February 2012 prompting him to the emergency room where he was diagnosed with gallstones. He reports some subjective fevers and chills. He did have some nausea yesterday. He denies any weight loss, jaundice, diarrhea, constipation, melena, hematochezia. He states that if he is not moving the pain is about a 4/10; however, if he moves the pain is very intense. He denies any acholic stools. He denies any difficulty swallowing  After evaluation by Dr. Biagio Quint, he was admitted on 26/18 for acute cholecystitis. He was taken to the OR same day and underwent lap chole with negative IOC. He had a large cystic duct, which required endo-loop and therefore a drain was placed as a precaution. Post-op, he had some pain control issues but also required an additional day of IV antibiotics. He gradually tolerated advanced diet and had no other issues. His drain output was minimal and no bile, and it was removed day of discharge. He is stable for Discharge as of POD#2.  Consults: None   Discharge Exam: Blood pressure 118/51, pulse  79, temperature 98.3 F (36.8 C), temperature source Oral, resp. rate 18, height 6\' 1"  (1.854 m), weight 290 lb (131.543 kg), SpO2 99.00%. Lungs: CTA without w/r/r Heart: Regular Abdomen: soft, ND, appropriately tender   Incisions all c/d/i without erythema or hematoma. Ext: No edema or tenderness   Disposition: To home.  Discharge Orders    Future Orders Please Complete By Expires   Diet - low sodium heart healthy      Increase activity slowly      May shower / Bathe      Driving Restrictions      Comments:   May drive after 2-3 days. Do not take prescription pain medication prior to driving.   Lifting restrictions      Comments:   No heavy lifting greater then 10 lbs for 3 weeks.   Change dressing (specify)      Comments:   Dressing change: May place a dry dressing over drain site, but once not drainage, may leave open.   Call MD for:  redness, tenderness, or signs of infection (pain, swelling, redness, odor or green/yellow discharge around incision site)      Call MD for:  severe uncontrolled pain      Call MD for:  persistant nausea and vomiting      Call MD for:  temperature >100.4        Medication List  As of 08/15/2011 11:30 AM   TAKE these medications         HYDROcodone-acetaminophen 5-325 MG per tablet   Commonly known as: NORCO   Take 1-2 tablets by mouth every 4 (four) hours as needed for pain.      mulitivitamin with  minerals Tabs   Take 1 tablet by mouth daily.           Follow-up Information    Follow up with [facility],MD, MD on 08/28/2011. ([facility] clinic 3:00pm)    Contact information:   [facility] [address] [place] [address] [phone]          Signed: Alyse Low 08/15/2011, 11:30 AM

## 2011-08-28 ENCOUNTER — Encounter: Payer: Self-pay | Admitting: Radiology

## 2011-08-28 ENCOUNTER — Encounter (INDEPENDENT_AMBULATORY_CARE_PROVIDER_SITE_OTHER): Payer: Self-pay

## 2011-08-28 ENCOUNTER — Ambulatory Visit (INDEPENDENT_AMBULATORY_CARE_PROVIDER_SITE_OTHER): Payer: Managed Care, Other (non HMO) | Admitting: Radiology

## 2011-08-28 VITALS — BP 142/90 | HR 70 | Temp 98.1°F | Resp 18 | Ht 73.0 in | Wt 305.0 lb

## 2011-08-28 DIAGNOSIS — Z9889 Other specified postprocedural states: Secondary | ICD-10-CM

## 2011-08-28 DIAGNOSIS — Z9049 Acquired absence of other specified parts of digestive tract: Secondary | ICD-10-CM

## 2011-08-28 NOTE — Progress Notes (Signed)
ALICE VITELLI [birth year] [id number] 08/28/2011   THANH MOTTERN is a 26 y.o. male who had a laparoscopic cholecystectomy with intraoperative cholangiogram by Dr. Biagio Quint.  The pathology report confirmed cholcystitis.  The patient reports that they are feeling well with normal bowel movements and good appetite.  The pre-operative symptoms of abdominal pain, nausea, and vomiting have resolved.  He reports that his umbilical incision did open and drian some serosanguinous fluid but then sealed over. But her still feels a 'knot'  Physical examination - Incisions appear well-healed with no sign of infection or bleeding.  There is a palpable mass under the incision which is not tender, it is not suspicious for a hernia and I suspect it to be a small hematoma or seroma. Abdomen - soft, non-tender  Impression:  s/p laparoscopic cholecystectomy  Plan:  He may resume a regular diet and full activity.  He may follow-up on a PRN basis. Warm compress to abdomen. Instructed to observe for recurrent drainage, fever.  He can return to work next week.  Marianna Fuss 08/28/2011 3:54 PM

## 2013-02-09 ENCOUNTER — Ambulatory Visit (INDEPENDENT_AMBULATORY_CARE_PROVIDER_SITE_OTHER): Payer: BC Managed Care – PPO | Admitting: Family Medicine

## 2013-02-09 ENCOUNTER — Encounter: Payer: Self-pay | Admitting: Family Medicine

## 2013-02-09 VITALS — BP 140/90 | HR 82 | Temp 98.6°F | Ht 72.0 in | Wt 315.0 lb

## 2013-02-09 DIAGNOSIS — E669 Obesity, unspecified: Secondary | ICD-10-CM

## 2013-02-09 DIAGNOSIS — IMO0001 Reserved for inherently not codable concepts without codable children: Secondary | ICD-10-CM

## 2013-02-09 DIAGNOSIS — Z7689 Persons encountering health services in other specified circumstances: Secondary | ICD-10-CM

## 2013-02-09 DIAGNOSIS — R030 Elevated blood-pressure reading, without diagnosis of hypertension: Secondary | ICD-10-CM

## 2013-02-09 DIAGNOSIS — Z23 Encounter for immunization: Secondary | ICD-10-CM

## 2013-02-09 DIAGNOSIS — Z7189 Other specified counseling: Secondary | ICD-10-CM

## 2013-02-09 LAB — HEMOGLOBIN A1C: Hgb A1c MFr Bld: 5.2 % (ref 4.6–6.5)

## 2013-02-09 LAB — LIPID PANEL
Cholesterol: 197 mg/dL (ref 0–200)
HDL: 35.2 mg/dL — ABNORMAL LOW (ref >39.00)
LDL Cholesterol: 133 mg/dL — ABNORMAL HIGH (ref 0–99)
Total CHOL/HDL Ratio: 6
Triglycerides: 144 mg/dL (ref 0.0–149.0)
VLDL: 28.8 mg/dL (ref 0.0–40.0)

## 2013-02-09 NOTE — Progress Notes (Signed)
Chief Complaint  Patient presents with  . Establish Care    HPI:  John Faulkner is here to establish care. Has not had a PCP in some time. Wants to gets some basic labs.  Has the following chronic problems and concerns today:  Patient Active Problem List   Diagnosis Date Noted  . Elevated blood pressure 08/13/2011   Health Maintenance: -needs tdap  ROS: See pertinent positives and negatives per HPI.  Past Medical History  Diagnosis Date  . Elevated blood pressure   . Acute calculous cholecystitis 08/13/2011   Past Surgical History  Procedure Laterality Date  . Cholecystectomy  08/13/2011    Procedure: LAPAROSCOPIC CHOLECYSTECTOMY WITH INTRAOPERATIVE CHOLANGIOGRAM;  Surgeon: Rulon Abide, DO;  Location: [facility] OR;  Service: General;  Laterality: N/A;     . Tonsillectomy  2001   Family History  Problem Relation Age of Onset  . COPD Father   . Diabetes Father   . Heart disease Father 71  . Stroke Father   . Diabetes Maternal Grandfather   . Diabetes Paternal Grandfather     History   Social History  . Marital Status: Married    Spouse Name: N/A    Number of Children: N/A  . Years of Education: N/A   Social History Main Topics  . Smoking status: Never Smoker   . Smokeless tobacco: Never Used  . Alcohol Use: No  . Drug Use: No  . Sexual Activity: None   Other Topics Concern  . None   Social History Narrative   Work or School: Airline pilot; [employer] Situation: lives with wife, expecting a child      Spiritual Beliefs: none      Lifestyle: no regular exercise; poor             No current outpatient prescriptions on file.  EXAM:  Filed Vitals:   02/09/13 0930  BP: 140/90  Pulse: 82  Temp: 98.6 F (37 C)    Body mass index is 42.71 kg/(m^2).  GENERAL: vitals reviewed and listed above, alert, oriented, appears well hydrated and in no acute distress  HEENT: atraumatic, conjunttiva clear, no obvious abnormalities on inspection of  external nose and ears  NECK: no obvious masses on inspection  LUNGS: clear to auscultation bilaterally, no wheezes, rales or rhonchi, good air movement  CV: HRRR, no peripheral edema  MS: moves all extremities without noticeable abnormality  PSYCH: pleasant and cooperative, no obvious depression or anxiety  ASSESSMENT AND PLAN:  Discussed the following assessment and plan:  Encounter to establish care - Plan: Hemoglobin A1c, Lipid Panel  Obesity, unspecified - Plan: Hemoglobin A1c, Lipid Panel  Elevated blood pressure  -We reviewed the PMH, PSH, FH, SH, Meds and Allergies. -We provided refills for any medications we will prescribe as needed. -We addressed current concerns per orders and patient instructions. -We have asked for records for pertinent exams, studies, vaccines and notes from previous providers. -We have advised patient to follow up per instructions below. -FASTING LABS(had orange juice and banana) -advised lifestyle changes for borderline HTN -tdap today  -Patient advised to return or notify a doctor immediately if symptoms worsen or persist or new concerns arise.  Patient Instructions  -We have ordered labs or studies at this visit. It can take up to 1-2 weeks for results and processing. We will contact you with instructions IF your results are abnormal. Normal results will be released to your [facility]. If you have not  heard from [place] or can not find your results in [facility] in 2 weeks please contact our office.  -PLEASE SIGN UP FOR MYCHART TODAY   We recommend the following healthy lifestyle measures: - eat a healthy diet consisting of lots of vegetables, fruits, beans, nuts, seeds, healthy meats such as white chicken and fish and whole grains.  - avoid fried foods, fast food, processed foods, sodas, red meet and other fattening foods.  - get a least 150 minutes of aerobic exercise per week.   Follow up in: 1 year - or as needed      KIM, HANNAH R.

## 2013-02-09 NOTE — Patient Instructions (Addendum)
-  We have ordered labs or studies at this visit. It can take up to 1-2 weeks for results and processing. We will contact you with instructions IF your results are abnormal. Normal results will be released to your MYCHART. If you have not heard from us or can not find your results in MYCHART in 2 weeks please contact our office.  -PLEASE SIGN UP FOR MYCHART TODAY   We recommend the following healthy lifestyle measures: - eat a healthy diet consisting of lots of vegetables, fruits, beans, nuts, seeds, healthy meats such as white chicken and fish and whole grains.  - avoid fried foods, fast food, processed foods, sodas, red meet and other fattening foods.  - get a least 150 minutes of aerobic exercise per week.   Follow up in: 1 year or as needed  

## 2013-04-30 ENCOUNTER — Other Ambulatory Visit: Payer: Self-pay

## 2013-10-19 ENCOUNTER — Telehealth: Payer: Self-pay | Admitting: Family Medicine

## 2013-10-19 ENCOUNTER — Encounter: Payer: Self-pay | Admitting: Family Medicine

## 2013-10-19 ENCOUNTER — Ambulatory Visit (INDEPENDENT_AMBULATORY_CARE_PROVIDER_SITE_OTHER): Payer: BC Managed Care – PPO | Admitting: Family Medicine

## 2013-10-19 VITALS — BP 132/80 | HR 80 | Temp 98.8°F | Ht 73.0 in | Wt 325.4 lb

## 2013-10-19 DIAGNOSIS — IMO0001 Reserved for inherently not codable concepts without codable children: Secondary | ICD-10-CM

## 2013-10-19 DIAGNOSIS — E785 Hyperlipidemia, unspecified: Secondary | ICD-10-CM

## 2013-10-19 DIAGNOSIS — R002 Palpitations: Secondary | ICD-10-CM

## 2013-10-19 DIAGNOSIS — E669 Obesity, unspecified: Secondary | ICD-10-CM

## 2013-10-19 DIAGNOSIS — R030 Elevated blood-pressure reading, without diagnosis of hypertension: Secondary | ICD-10-CM

## 2013-10-19 LAB — LIPID PANEL
Cholesterol: 189 mg/dL (ref 0–200)
HDL: 36.4 mg/dL — ABNORMAL LOW (ref >39.00)
LDL Cholesterol: 128 mg/dL — ABNORMAL HIGH (ref 0–99)
Total CHOL/HDL Ratio: 5
Triglycerides: 122 mg/dL (ref 0.0–149.0)
VLDL: 24.4 mg/dL (ref 0.0–40.0)

## 2013-10-19 LAB — BASIC METABOLIC PANEL
BUN: 9 mg/dL (ref 6–23)
CO2: 28 mEq/L (ref 19–32)
Calcium: 9.4 mg/dL (ref 8.4–10.5)
Chloride: 104 mEq/L (ref 96–112)
Creatinine, Ser: 0.8 mg/dL (ref 0.4–1.5)
GFR: 126.17 mL/min (ref >60.00)
Glucose, Bld: 84 mg/dL (ref 70–99)
Potassium: 3.7 mEq/L (ref 3.5–5.1)
Sodium: 140 mEq/L (ref 135–145)

## 2013-10-19 NOTE — Telephone Encounter (Signed)
Patient Information:  Caller Name: [name]  Phone: [phone]  Patient: John Faulkner, John Faulkner  Gender: Male  DOB: 1986-06-10  Age: 28 Years  PCP: [name] (TEXT 1st, after 20 mins can call), [name] [facility](Family Practice)  Office Follow Up:  Does the office need to follow up with this patient?: No  Instructions For The Office: N/A  RN Note:  Patient states he has had intermittent irregular heart beat, onset 10/17/13. Patient states episodes last for approx. 1 second and resolve. Patient states he is experiencing " at least 20" episodes/day. Patient denies chest pain, arm, neck or back pain. Patient denies dyspnea, nausea or diaphoresis. Patient denies numbness or tingling. Patient states episodes are not related to exertion.  Care advice given per guidelines. Patient advised increased fluids, avoid caffeine. Call back parameters reviewed. Patient verbalizes understanding.  Symptoms  Reason For Call & Symptoms: Irregular heart beat  Reviewed Health History In EMR: Yes  Reviewed Medications In EMR: Yes  Reviewed Allergies In EMR: Yes  Reviewed Surgeries / Procedures: Yes  Date of Onset of Symptoms: 10/17/2013  Guideline(s) Used:  Heart Rate and Heartbeat Questions  Disposition Per Guideline:   See Today in Office  Reason For Disposition Reached:   Patient wants to be seen  Advice Given:  Avoid Caffeine  Avoid caffeine-containing beverages (Reason: caffeine is a stimulant and can aggravate palpitations).  Examples of caffeine-containing beverages include coffee, tea, colas, [address]ountain Dew, Bed Bath & Beyonded Bull, and some energy drinks.  Limit Alcohol:  Limit your alcohol consumption to no more than 2 drinks a day. Ideally, eliminate alcohol entirely for the next 2 weeks.  Patient Will Follow Care Advice:  YES  Appointment Scheduled:  10/19/2013 14:15:00 Appointment Scheduled Provider:  [name] (TEXT 1st, after 20 mins can call), [name] [facility](Family Practice)

## 2013-10-19 NOTE — Progress Notes (Signed)
Pre visit review using our clinic review tool, if applicable. No additional management support is needed unless otherwise documented below in the visit note. 

## 2013-10-19 NOTE — Patient Instructions (Signed)
-  We have ordered labs or studies at this visit. It can take up to 1-2 weeks for results and processing. We will contact you with instructions IF your results are abnormal. Normal results will be released to your [facility]. If you have not heard from us or can not find your results in [facility] in 2 weeks please contact our office.  -We placed a referral for you as discussed to the cardiologist. It usually takes about 1-2 weeks to process and schedule this referral. If you have not heard from us regarding this appointment in 2 weeks please contact our office.   -PLEASE SIGN UP FOR MYCHART TODAY   We recommend the following healthy lifestyle measures: - eat a healthy diet consisting of lots of vegetables, fruits, beans, nuts, seeds, healthy meats such as white chicken and fish and whole grains.  - avoid fried foods, fast food, processed foods, sodas, red meet and other fattening foods.  - get a least 150 minutes of aerobic exercise per week.   Follow up in: 3 months

## 2013-10-19 NOTE — Progress Notes (Signed)
No chief complaint on file.   HPI:  Acute visit for:  1)Palpitations: -reports: has had palpitations on and off since he was a child but has felt them more commonly the last few days but has been anxious about it - last less then a sec - just notices heat feel like skips a beat -denies: CP, SOB, swelling, jaw pain -typically drinks a lot of caffeine but quit cold [place] a few days ago -wants to see cardiologist as he is very anxious about this  Chronic follow up: Note: seen once almost 1 year ago and advise f/u in 4-6 months after lifestyle changes for dyslipidemia found on labs.  1)Elevated BP: -mild in the past and not treated with medication  2)Dyslipidemia -advised lifestyle changes and recheck, did not follow up as advised  3)Obesity -advised lifestyle changes  ROS: See pertinent positives and negatives per HPI.  Past Medical History  Diagnosis Date  . Elevated blood pressure   . Acute calculous cholecystitis 08/13/2011    Past Surgical History  Procedure Laterality Date  . Cholecystectomy  08/13/2011    Procedure: LAPAROSCOPIC CHOLECYSTECTOMY WITH INTRAOPERATIVE CHOLANGIOGRAM;  Surgeon: Rulon AbideBrian David Layton, DO;  [facility];  Service: General;  Laterality: N/A;     . Tonsillectomy  2001    Family History  Problem Relation Age of Onset  . COPD Father   . Diabetes Father   . Heart disease Father 1552  . Stroke Father   . Diabetes Maternal Grandfather   . Diabetes Paternal Grandfather     History   Social History  . Marital Status: Married    Spouse Name: N/A    Number of Children: N/A  . Years of Education: N/A   Social History Main Topics  . Smoking status: Never Smoker   . Smokeless tobacco: Never Used  . Alcohol Use: No  . Drug Use: No  . Sexual Activity: None   Other Topics Concern  . None   Social History Narrative   Work or School: [employer]; [employer]      Home Situation: lives with wife, expecting a child      Spiritual Beliefs: none       Lifestyle: no regular exercise; poor             No current outpatient prescriptions on file.  EXAM:  Filed Vitals:   10/19/13 1418  BP: 132/80  Pulse: 80  Temp: 98.8 F (37.1 C)    Body mass index is 42.94 kg/(m^2).  GENERAL: vitals reviewed and listed above, alert, oriented, appears well hydrated and in no acute distress  HEENT: atraumatic, conjunttiva clear, no obvious abnormalities on inspection of external nose and ears  NECK: no obvious masses on inspection  LUNGS: clear to auscultation bilaterally, no wheezes, rales or rhonchi, good air movement  CV: HRRR, no peripheral edema  MS: moves all extremities without noticeable abnormality  PSYCH: pleasant and cooperative, no obvious depression or anxiety  ASSESSMENT AND PLAN:  Discussed the following assessment and plan:  Palpitations -his whole life but acutely worse recently and he is quite anxious - disucssed etiolgies, that these are common, tx, evaluation  -EKG shows NSR, labs: BMP, lipid panel, TSH -he wants to see cardiologist - referral placed -return and emergent precautions discussed  Obesity, unspecified -advised lifestyle recs  Elevated blood pressure -better on repeat  Dyslipidemia -recheck today with labs  -Patient advised to return or notify a doctor immediately if symptoms worsen or persist or new concerns arise.  Patient Instructions  -We have ordered labs or studies at this visit. It can take up to 1-2 weeks for results and processing. We will contact you with instructions IF your results are abnormal. Normal results will be released to your [facility]. If you have not heard from us or can not find your results in [facility] in 2 weeks please contact our office.  -We placed a referral for you as discussed to the cardiologist. It usually takes about 1-2 weeks to process and schedule this referral. If you have not heard from us regarding this appointment in 2 weeks please contact our  office.   -PLEASE SIGN UP FOR MYCHART TODAY   We recommend the following healthy lifestyle measures: - eat a healthy diet consisting of lots of vegetables, fruits, beans, nuts, seeds, healthy meats such as white chicken and fish and whole grains.  - avoid fried foods, fast food, processed foods, sodas, red meet and other fattening foods.  - get a least 150 minutes of aerobic exercise per week.   Follow up in: 3 months      Terressa KoyanagiHannah R. Kim

## 2013-10-20 LAB — TSH: TSH: 2.34 u[IU]/mL (ref 0.35–5.50)

## 2013-11-06 ENCOUNTER — Ambulatory Visit: Payer: BC Managed Care – PPO | Admitting: Cardiology

## 2014-01-19 ENCOUNTER — Ambulatory Visit: Payer: BC Managed Care – PPO | Admitting: Family Medicine

## 2017-03-14 ENCOUNTER — Encounter: Payer: Self-pay | Admitting: Family Medicine

## 2017-04-19 ENCOUNTER — Other Ambulatory Visit: Payer: Self-pay | Admitting: *Deleted

## 2017-04-19 ENCOUNTER — Encounter: Payer: Self-pay | Admitting: Neurology

## 2017-04-19 DIAGNOSIS — G5603 Carpal tunnel syndrome, bilateral upper limbs: Secondary | ICD-10-CM

## 2017-05-07 ENCOUNTER — Ambulatory Visit (INDEPENDENT_AMBULATORY_CARE_PROVIDER_SITE_OTHER): Payer: 59 | Admitting: Neurology

## 2017-05-07 DIAGNOSIS — G5603 Carpal tunnel syndrome, bilateral upper limbs: Secondary | ICD-10-CM | POA: Diagnosis not present

## 2017-05-07 NOTE — Procedures (Signed)
[facility]  [address], [address]  [place], [place] [address] Tel: [phone] Fax:  [phone] Test Date:  05/07/2017  Patient: John HenleDonald Faulkner DOB: [birth year] Physician: Nita Sickleonika Jihad Brownlow, DO  Sex: Male Height: 6\' 1"  Ref Phys: Burnell BlanksW. Dan Caffrey, MD  ID#: [id number] Temp: 34.6C Technician:    Patient Complaints: This is a 31 year old man referred for evaluation of bilateral hand paresthesias, worse on the left.  NCV & EMG Findings: Extensive electrodiagnostic testing of the left upper extremity and additional studies of the right shows:  1. Left median sensory response is absent. Right median sensory response shows showed prolonged distal peak latency (5.5 ms) and reduced amplitude (13.5 V). Bilateral ulnar sensory responses are within normal limits. 2. Left median motor response shows prolonged latency and severely reduced amplitude (0.9 mV). 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 Right median motor response shows prolonged latency (5.6 ms) and normal amplitude; there is evidence of a Martin-Gruber anastomosis on the right as well. Bilateral ulnar motor responses are within normal limits. 3. Active on chronic motor axon loss changes are seen in the left abductor pollicis brevis muscle only. The remaining tested muscles show normal motor unit configuration and recruitment pattern.  Impression: 1. Left median neuropathy at or distal to the wrist, consistent with clinical diagnosis of carpal tunnel syndrome; very severe in degree electrically. 2. Right median neuropathy at or distal to the wrist, consistent with clinical diagnosis of carpal tunnel syndrome; severe in degree electrically. 3. 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    ___________________________ Nita Sickleonika Zorianna Taliaferro, DO    Nerve Conduction Studies Anti Sensory Summary Table   Site NR Peak (ms) Norm Peak (ms) P-T Amp (V) Norm P-T Amp  Left Median Anti Sensory (2nd Digit)  34.6C  Wrist NR  <3.4  >20  Right Median Anti Sensory (2nd Digit)  34.6C  Wrist    5.5 <3.4 13.5 >20  Left Ulnar Anti Sensory (5th Digit)  34.6C  Wrist    2.5 <3.1 25.1 >12  Right Ulnar Anti Sensory (5th Digit)  34.6C  Wrist    2.5 <3.1 30.2 >12   Motor Summary Table   Site NR Onset (ms) Norm Onset (ms) O-P Amp (mV) Norm O-P Amp Site1 Site2 Delta-0 (ms) Dist (cm) Vel (m/s) Norm Vel (m/s)  Left Median Motor (Abd Poll Brev)  34.6C  Wrist    4.0 <3.9 0.9 >6 Elbow Wrist 5.6 31.0 55 >50  Elbow    9.6  2.3  Ulnar-wrist crossover Elbow 6.2 0.0    Ulnar-wrist crossover    3.4  4.8         Right Median Motor (Abd Poll Brev)  34.6C  Wrist    5.6 <3.9 9.3 >6 Elbow Wrist 4.9 30.0 61 >50  Elbow    10.5  8.8  Ulnar-wrist crossover Elbow 6.4 0.0    Ulnar-wrist crossover    4.1  2.3         Left Ulnar Motor (Abd Dig Minimi)  34.6C  Wrist    2.0 <3.1 11.1 >7 B Elbow Wrist 4.6 27.0 59 >50  B Elbow    6.6  9.6  A Elbow B Elbow 1.7 10.0 59 >50  A Elbow    8.3  9.4  Right Ulnar Motor (Abd Dig Minimi)  34.6C  Wrist    2.4 <3.1 13.6 >7 B Elbow Wrist 4.6 28.0 61 >50  B Elbow    7.0  12.8  A Elbow B Elbow 1.4 10.0 71 >50  A Elbow    8.4  11.6          EMG   Side Muscle Ins Act Fibs Psw Fasc Number Recrt Dur Dur. Amp Amp. Poly Poly. Comment  Left 1stDorInt Nml Nml Nml Nml Nml Nml Nml Nml Nml Nml Nml Nml N/A  Left Abd Poll Brev Nml 2+ Nml Nml 2- Rapid Many 1+ Many 1+ Many 1+ N/A  Left FlexPolLong Nml Nml Nml Nml Nml Nml Nml Nml Nml Nml Nml Nml N/A  Left PronatorTeres Nml Nml Nml Nml Nml Nml Nml Nml Nml Nml Nml Nml N/A  Left Biceps Nml Nml Nml Nml Nml Nml Nml Nml Nml Nml Nml Nml N/A  Left Triceps Nml Nml Nml Nml Nml Nml Nml Nml Nml Nml Nml Nml N/A  Left Deltoid Nml Nml  Nml Nml Nml Nml Nml Nml Nml Nml Nml Nml N/A  Right 1stDorInt Nml Nml Nml Nml Nml Nml Nml Nml Nml Nml Nml Nml N/A  Right Abd Poll Brev Nml Nml Nml Nml Nml Nml Nml Nml Nml Nml Nml Nml N/A  Right PronatorTeres Nml Nml Nml Nml Nml Nml Nml Nml Nml Nml Nml Nml N/A      Waveforms:

## 2017-07-04 ENCOUNTER — Encounter: Payer: Self-pay | Admitting: Family Medicine

## 2017-09-12 DIAGNOSIS — H9011 Conductive hearing loss, unilateral, right ear, with unrestricted hearing on the contralateral side: Secondary | ICD-10-CM | POA: Insufficient documentation

## 2018-05-04 NOTE — Progress Notes (Signed)
HPI:  Using dictation device. Unfortunately this device frequently misinterprets words/phrases.  John Faulkner is a pleasant 32 yo whom I have not seen in over 4 years here for an acute visit for completion of paperwork for his job.  Has a biometric form that requires completion.  He reports he has been working hard on weight reduction since July.  [facility]  He has lost 53 pounds per his report.  He was on phentermine short-term, but it did not help.  He currently is mainly working on diet.  Not getting regular exercise.  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  No alcohol or tobacco.  Reports his lab work at [employer] showed hypertriglyceridemia and hyperglycemia.  He also had elevated blood pressure.  This is now improving.  No chest pain, shortness of breath with exercise.  ROS: See pertinent positives and negatives per HPI.  Past Medical History:  Diagnosis Date  . Acute calculous cholecystitis 08/13/2011  . Elevated blood pressure     Past Surgical History:  Procedure Laterality Date  . CHOLECYSTECTOMY  08/13/2011   Procedure: LAPAROSCOPIC CHOLECYSTECTOMY WITH INTRAOPERATIVE CHOLANGIOGRAM;  Surgeon: Rulon Abide, DO;  Location: [facility] OR;  Service: General;  Laterality: N/A;     . TONSILLECTOMY  2001    Family History  Problem Relation Age of Onset  . COPD Father   . Diabetes Father   . Heart disease Father 68  . Stroke Father   . Diabetes Maternal Grandfather   . Diabetes Paternal Grandfather     SOCIAL HX: See HPI  No current outpatient medications on file.  EXAM:  Vitals:   05/05/18 0845  BP: 120/78  Pulse: (!) 56  Temp: 97.9 F (36.6 C)    Body mass index is 40.88 kg/m.  GENERAL: vitals reviewed and listed above, alert, oriented, appears well hydrated and in no acute distress  HEENT: atraumatic, conjunttiva clear, no obvious abnormalities on inspection of external nose and ears  NECK: no obvious masses  on inspection  LUNGS: clear to auscultation bilaterally, no wheezes, rales or rhonchi, good air movement  CV: HRRR, no peripheral edema  MS: moves all extremities without noticeable abnormality  PSYCH: pleasant and cooperative, no obvious depression or anxiety  ASSESSMENT AND PLAN:  Discussed the following assessment and plan: More than 50% of over 30 minutes spent in total in caring for this patient was spent face-to-face with the patient, counseling and/or coordinating care.    Elevated BP without diagnosis of hypertension - Plan: Basic metabolic panel  Morbid obesity (HCC)  Hyperlipidemia, unspecified hyperlipidemia type - Plan: Lipid panel  Hyperglycemia - Plan: Hemoglobin A1c  -Labs per orders -Lengthy discussion regarding lifestyle, recommended a healthy diet and suggested the Mediterranean diet, also recommended adding regular aerobic exercise.  Recommended managing stress well and getting help if needed. -Follow-up 6 months -Gait form to assisted for completion -Patient advised to return or notify a doctor immediately if symptoms worsen or persist or new concerns arise.  Patient Instructions  BEFORE YOU LEAVE: -labs -biometric form completion and hold for labs -follow up: 6 months  We have ordered labs or studies at this visit. It can take up to 1-2 weeks for results and processing. IF results require follow up or explanation, we will call you with instructions. Clinically stable results will be released to your [facility]. If you have not heard from [place] or cannot find your results in [facility]  in 2 weeks please contact our office at [phone].  If you are not yet signed up for [facility], please consider signing up.   We recommend the following healthy lifestyle for LIFE: 1) Small portions. But, make sure to get regular (at least 3 per day), healthy meals and small healthy snacks if needed.  2) Eat a healthy clean diet.   TRY TO EAT: -at least 5-7 servings of low  sugar, colorful, and nutrient rich vegetables per day (not corn, potatoes or bananas.) -berries are the best choice if you wish to eat fruit (only eat small amounts if trying to reduce weight)  -lean meets (fish, white meat of chicken or [place]) -vegan proteins for some meals - beans or tofu, whole grains, nuts and seeds -Replace bad fats with good fats - good fats include: fish, nuts and seeds, canola oil, olive oil -small amounts of low fat or non fat dairy -small amounts of100 % whole grains - check the lables -drink plenty of water  AVOID: -SUGAR, sweets, anything with added sugar, corn syrup or sweeteners - must read labels as even foods advertised as "healthy" often are loaded with sugar -if you must have a sweetener, small amounts of stevia may be best -sweetened beverages and artificially sweetened beverages -simple starches (rice, bread, potatoes, pasta, chips, etc - small amounts of 100% whole grains are ok) -red meat, pork, butter -fried foods, fast food, processed food, excessive dairy, eggs and coconut.  3)Get at least 150 minutes of sweaty aerobic exercise per week.  4)Reduce stress - consider counseling, meditation and relaxation to balance other aspects of your life.   WE NOW OFFER   [facility] FAST TRACK!!!  SAME DAY Appointments for ACUTE CARE  Such as: Sprains, Injuries, cuts, abrasions, rashes, muscle pain, joint pain, back pain Colds, flu, sore throats, headache, allergies, cough, fever  Ear pain, sinus and eye infections Abdominal pain, nausea, vomiting, diarrhea, upset stomach Animal/insect bites  3 Easy Ways to Schedule: Walk-In Scheduling Call in scheduling Mychart Sign-up: [email or web address]                 Terressa Koyanagi, DO

## 2018-05-05 ENCOUNTER — Encounter: Payer: Self-pay | Admitting: Family Medicine

## 2018-05-05 ENCOUNTER — Ambulatory Visit: Payer: 59 | Admitting: Family Medicine

## 2018-05-05 VITALS — BP 120/78 | HR 56 | Temp 97.9°F | Ht 72.0 in | Wt 301.4 lb

## 2018-05-05 DIAGNOSIS — R739 Hyperglycemia, unspecified: Secondary | ICD-10-CM | POA: Diagnosis not present

## 2018-05-05 DIAGNOSIS — E785 Hyperlipidemia, unspecified: Secondary | ICD-10-CM

## 2018-05-05 DIAGNOSIS — R030 Elevated blood-pressure reading, without diagnosis of hypertension: Secondary | ICD-10-CM

## 2018-05-05 LAB — HEMOGLOBIN A1C: Hgb A1c MFr Bld: 5.4 % (ref 4.6–6.5)

## 2018-05-05 LAB — BASIC METABOLIC PANEL
BUN: 9 mg/dL (ref 6–23)
CO2: 30 mEq/L (ref 19–32)
Calcium: 9.3 mg/dL (ref 8.4–10.5)
Chloride: 104 mEq/L (ref 96–112)
Creatinine, Ser: 0.91 mg/dL (ref 0.40–1.50)
GFR: 102.41 mL/min (ref >60.00)
Glucose, Bld: 100 mg/dL — ABNORMAL HIGH (ref 70–99)
Potassium: 5.1 mEq/L (ref 3.5–5.1)
Sodium: 140 mEq/L (ref 135–145)

## 2018-05-05 LAB — LIPID PANEL
Cholesterol: 149 mg/dL (ref 0–200)
HDL: 35.9 mg/dL — ABNORMAL LOW (ref >39.00)
LDL Cholesterol: 91 mg/dL (ref 0–99)
NonHDL: 112.64
Total CHOL/HDL Ratio: 4
Triglycerides: 108 mg/dL (ref 0.0–149.0)
VLDL: 21.6 mg/dL (ref 0.0–40.0)

## 2018-05-05 NOTE — Patient Instructions (Signed)
BEFORE YOU LEAVE: -labs -biometric form completion and hold for labs -follow up: 6 months  We have ordered labs or studies at this visit. It can take up to 1-2 weeks for results and processing. IF results require follow up or explanation, we will call you with instructions. Clinically stable results will be released to your [facility]. If you have not heard from [place] or cannot find your results in [facility] in 2 weeks please contact our office at [phone].  If you are not yet signed up for [facility], please consider signing up.   We recommend the following healthy lifestyle for LIFE: 1) Small portions. But, make sure to get regular (at least 3 per day), healthy meals and small healthy snacks if needed.  2) Eat a healthy clean diet.   TRY TO EAT: -at least 5-7 servings of low sugar, colorful, and nutrient rich vegetables per day (not corn, potatoes or bananas.) -berries are the best choice if you wish to eat fruit (only eat small amounts if trying to reduce weight)  -lean meets (fish, white meat of chicken or [place]) -vegan proteins for some meals - beans or tofu, whole grains, nuts and seeds -Replace bad fats with good fats - good fats include: fish, nuts and seeds, canola oil, olive oil -small amounts of low fat or non fat dairy -small amounts of100 % whole grains - check the lables -drink plenty of water  AVOID: -SUGAR, sweets, anything with added sugar, corn syrup or sweeteners - must read labels as even foods advertised as "healthy" often are loaded with sugar -if you must have a sweetener, small amounts of stevia may be best -sweetened beverages and artificially sweetened beverages -simple starches (rice, bread, potatoes, pasta, chips, etc - small amounts of 100% whole grains are ok) -red meat, pork, butter -fried foods, fast food, processed food, excessive dairy, eggs and coconut.  3)Get at least 150 minutes of sweaty aerobic exercise per week.  4)Reduce stress - consider  counseling, meditation and relaxation to balance other aspects of your life.   WE NOW OFFER   [facility] FAST TRACK!!!  SAME DAY Appointments for ACUTE CARE  Such as: Sprains, Injuries, cuts, abrasions, rashes, muscle pain, joint pain, back pain Colds, flu, sore throats, headache, allergies, cough, fever  Ear pain, sinus and eye infections Abdominal pain, nausea, vomiting, diarrhea, upset stomach Animal/insect bites  3 Easy Ways to Schedule: Walk-In Scheduling Call in scheduling Mychart Sign-up: [email or web address]

## 2018-05-26 ENCOUNTER — Telehealth: Payer: Self-pay | Admitting: *Deleted

## 2018-05-26 NOTE — Telephone Encounter (Signed)
Looks like they were sent through [employer]. Please complete form and fax if you have it?

## 2018-05-26 NOTE — Telephone Encounter (Signed)
Copied from CRM [id number]. Topic: General - Other >> May 23, 2018  3:44 PM [name], John Faulkner wrote: Reason for CRM: Pt stated the form that he submitted is suppose to be faxed in by 05/24/18. Pt stated he can come by to pick up the form and fax it himself. Pt advised that office is closed. Pt requests that the form be faxed as soon as possible.

## 2018-05-26 NOTE — Telephone Encounter (Signed)
I called the pt, apologized and informed him the form was completed and faxed to [employer] at [phone].  I also added a note stating our office was closed on 11/30.

## 2018-07-07 DIAGNOSIS — F900 Attention-deficit hyperactivity disorder, predominantly inattentive type: Secondary | ICD-10-CM | POA: Diagnosis not present

## 2018-07-07 DIAGNOSIS — F332 Major depressive disorder, recurrent severe without psychotic features: Secondary | ICD-10-CM | POA: Diagnosis not present

## 2018-07-31 DIAGNOSIS — F900 Attention-deficit hyperactivity disorder, predominantly inattentive type: Secondary | ICD-10-CM | POA: Diagnosis not present

## 2018-07-31 DIAGNOSIS — F332 Major depressive disorder, recurrent severe without psychotic features: Secondary | ICD-10-CM | POA: Diagnosis not present

## 2018-08-01 ENCOUNTER — Ambulatory Visit: Payer: BLUE CROSS/BLUE SHIELD | Admitting: Family Medicine

## 2018-08-01 ENCOUNTER — Encounter: Payer: Self-pay | Admitting: Family Medicine

## 2018-08-01 VITALS — BP 140/90 | HR 60 | Temp 98.3°F | Resp 12 | Ht 72.0 in | Wt 286.4 lb

## 2018-08-01 DIAGNOSIS — Z6838 Body mass index (BMI) 38.0-38.9, adult: Secondary | ICD-10-CM

## 2018-08-01 DIAGNOSIS — E663 Overweight: Secondary | ICD-10-CM | POA: Insufficient documentation

## 2018-08-01 DIAGNOSIS — R030 Elevated blood-pressure reading, without diagnosis of hypertension: Secondary | ICD-10-CM

## 2018-08-01 DIAGNOSIS — F909 Attention-deficit hyperactivity disorder, unspecified type: Secondary | ICD-10-CM | POA: Insufficient documentation

## 2018-08-01 DIAGNOSIS — E669 Obesity, unspecified: Secondary | ICD-10-CM | POA: Insufficient documentation

## 2018-08-01 NOTE — Progress Notes (Signed)
ACUTE VISIT   HPI:  Chief Complaint  Patient presents with  . Hypertension    b/p running high for the past week    Mr.Terron P Lorelee MarketFlannery is a 33 y.o. male, who is here today complaining of elevated BPs for the past couple weeks. He was started on Concerta 36 mg daily and Zoloft about 3 weeks ago, BP has been elevated since then. BP readings around 140-150s/90. [facility] and it was 168/103.  Yesterday he had follow-up with psychiatrist, BP was 158/92.  According to patient,elevated BP reading  was not addressed during follow-up visit.  Denies severe/frequent headache, visual changes, chest pain, dyspnea, palpitation, claudication, focal weakness, or edema.  No Hx of HTN but in the past he has had elevated BP, improved after weight loss. He is not exercising regularly and has not been consistent with a healthful diet.  He has not been on OTC cold medication or dietary problems and he denies illicit drug use. Negative for unusual stress.  ADHD recently diagnosed, he tried Wellbutrin before Concerta was started. He feels like Concerta is helping with focusing and concentration but he is willing to stop medication if it is causing BP elevation.  He denies depressed mood or suicidal thoughts.  Father with Hx of HTN.   Review of Systems  Constitutional: Negative for activity change, fatigue and fever.  HENT: Negative for nosebleeds and sore throat.   Eyes: Negative for redness and visual disturbance.  Respiratory: Negative for shortness of breath and wheezing.   Cardiovascular: Negative for chest pain, palpitations and leg swelling.  Gastrointestinal: Negative for abdominal pain, nausea and vomiting.  Endocrine: Negative for cold intolerance and heat intolerance.  Genitourinary: Negative for decreased urine volume and hematuria.  Neurological: Negative for dizziness, syncope, weakness, numbness and headaches.  Psychiatric/Behavioral: Negative for  confusion. The patient is nervous/anxious.       Current Outpatient Medications on File Prior to Visit  Medication Sig Dispense Refill  . methylphenidate 36 MG PO CR tablet Take 36 mg by mouth daily.    . sertraline (ZOLOFT) 50 MG tablet Take 50 mg by mouth daily.     No current facility-administered medications on file prior to visit.      Past Medical History:  Diagnosis Date  . Acute calculous cholecystitis 08/13/2011  . Elevated blood pressure    No Known Allergies  Social History   Socioeconomic History  . Marital status: Married    Spouse name: Not on file  . Number of children: Not on file  . Years of education: Not on file  . Highest education level: Not on file  Occupational History  . Not on file  Social Needs  . Financial resource strain: Not on file  . Food insecurity:    Worry: Not on file    Inability: Not on file  . Transportation needs:    Medical: Not on file    Non-medical: Not on file  Tobacco Use  . Smoking status: Never Smoker  . Smokeless tobacco: Never Used  Substance and Sexual Activity  . Alcohol use: No  . Drug use: No  . Sexual activity: Not on file  Lifestyle  . Physical activity:    Days per week: Not on file    Minutes per session: Not on file  . Stress: Not on file  Relationships  . Social connections:    Talks on phone: Not on file    Gets together:  Not on file    Attends religious service: Not on file    Active member of club or organization: Not on file    Attends meetings of clubs or organizations: Not on file    Relationship status: Not on file  Other Topics Concern  . Not on file  Social History Narrative   Work or School: Airline pilot; [employer] Situation: lives with wife, expecting a child      Spiritual Beliefs: none      Lifestyle: no regular exercise; poor             Vitals:   08/01/18 1500 08/01/18 1546  BP: 130/80 140/90  Pulse: 60   Resp: 12   Temp: 98.3 F (36.8 C)   SpO2: 99%    Body  mass index is 38.84 kg/m.   Physical Exam  Nursing note and vitals reviewed. Constitutional: He is oriented to person, place, and time. He appears well-developed. No distress.  HENT:  Head: Normocephalic and atraumatic.  Mouth/Throat: Oropharynx is clear and moist and mucous membranes are normal.  Eyes: Pupils are equal, round, and reactive to light. Conjunctivae and EOM are normal.  Cardiovascular: Normal rate and regular rhythm.  No murmur heard. Pulses:      Dorsalis pedis pulses are 2+ on the right side and 2+ on the left side.  Respiratory: Effort normal and breath sounds normal. No respiratory distress.  GI: Soft. He exhibits no mass. There is no hepatomegaly. There is no abdominal tenderness.  Musculoskeletal:        General: No edema.  Lymphadenopathy:    He has no cervical adenopathy.  Neurological: He is alert and oriented to person, place, and time. He has normal strength. No cranial nerve deficit. Gait normal.  Skin: Skin is warm. No rash noted. No erythema.  Psychiatric: His mood appears anxious.  Well groomed, good eye contact.      ASSESSMENT AND PLAN:  Mr. Razi was seen today for hypertension.  Diagnoses and all orders for this visit:  Elevated blood pressure reading We discussed diagnostic criteria for hypertension. Avoid using public BP monitors. Monitor BP at home, adequate technique taught here in the office. Low salt diet and wt loss recommended. He will check BP while taking Concerta for a week and then without Concerta for another week. Instructed about warning signs. Follow-up with PCP in 2 weeks, before if needed.  Class 2 severe obesity due to excess calories with serious comorbidity and body mass index (BMI) of 38.0 to 38.9 in adult [facility]) We discussed benefits of wt loss as well as adverse effects of obesity. Consistency with healthy diet and physical activity recommended.   Attention deficit hyperactivity disorder (ADHD), unspecified ADHD  type  We discussed side effects of ADHD medications in particular Concerta. All other treatment options like Strattera can be considered. Continue following with psychiatrist, recommend contacting their office if in fact Concerta is contributing to elevated BP.   Return in about 2 weeks (around 08/15/2018) for BP check.     Betty G. [place], MD  [facility]. [place] [facility].

## 2018-08-01 NOTE — Patient Instructions (Addendum)
A few things to remember from today's visit:   Elevated blood pressure reading Start Concerta for a week and check blood pressure daily. You can resume Concerta, check blood pressure while medication to determine if medication is causing elevation of BP.    How to Take Your Blood Pressure You can take your blood pressure at home with a machine. You may need to check your blood pressure at home:  To check if you have high blood pressure (hypertension).  To check your blood pressure over time.  To make sure your blood pressure medicine is working. Supplies needed: You will need a blood pressure machine, or monitor. You can buy one at a drugstore or online. When choosing one:  Choose one with an arm cuff.  Choose one that wraps around your upper arm. Only one finger should fit between your arm and the cuff.  Do not choose one that measures your blood pressure from your wrist or finger. Your doctor can suggest a monitor. How to prepare Avoid these things for 30 minutes before checking your blood pressure:  Drinking caffeine.  Drinking alcohol.  Eating.  Smoking.  Exercising. Five minutes before checking your blood pressure:  Pee.  Sit in a dining chair. Avoid sitting in a soft couch or armchair.  Be quiet. Do not talk. How to take your blood pressure Follow the instructions that came with your machine. If you have a digital blood pressure monitor, these may be the instructions: 1. Sit up straight. 2. Place your feet on the floor. Do not cross your ankles or legs. 3. Rest your left arm at the level of your heart. You may rest it on a table, desk, or chair. 4. Pull up your shirt sleeve. 5. Wrap the blood pressure cuff around the upper part of your left arm. The cuff should be 1 inch (2.5 cm) above your elbow. It is best to wrap the cuff around bare skin. 6. Fit the cuff snugly around your arm. You should be able to place only one finger between the cuff and your  arm. 7. Put the cord inside the groove of your elbow. 8. Press the power button. 9. Sit quietly while the cuff fills with air and loses air. 10. Write down the numbers on the screen. 11. Wait 2-3 minutes and then repeat steps 1-10. What do the numbers mean? Two numbers make up your blood pressure. The first number is called systolic pressure. The second is called diastolic pressure. An example of a blood pressure reading is "120 over 80" (or 120/80). If you are an adult and do not have a medical condition, use this guide to find out if your blood pressure is normal: Normal  First number: below 120.  Second number: below 80. Elevated  First number: 120-129.  Second number: below 80. Hypertension stage 1  First number: 130-139.  Second number: 80-89. Hypertension stage 2  First number: 140 or above.  Second number: 90 or above. Your blood pressure is above normal even if only the top or bottom number is above normal. Follow these instructions at home:  Check your blood pressure as often as your doctor tells you to.  Take your monitor to your next doctor's appointment. Your doctor will: ? Make sure you are using it correctly. ? Make sure it is working right.  Make sure you understand what your blood pressure numbers should be.  Tell your doctor if your medicines are causing side effects. Contact a doctor if:  Your blood pressure keeps being high. Get help right away if:  Your first blood pressure number is higher than 180.  Your second blood pressure number is higher than 120. This information is not intended to replace advice given to you by your health care provider. Make sure you discuss any questions you have with your health care provider. Document Released: 05/24/2008 Document Revised: 05/09/2016 Document Reviewed: 11/18/2015 Elsevier Interactive Patient Education  2019 [employer].  Please be sure medication list is accurate. If a new problem present,  please set up appointment sooner than planned today.

## 2018-10-20 DIAGNOSIS — F900 Attention-deficit hyperactivity disorder, predominantly inattentive type: Secondary | ICD-10-CM | POA: Diagnosis not present

## 2018-10-20 DIAGNOSIS — F332 Major depressive disorder, recurrent severe without psychotic features: Secondary | ICD-10-CM | POA: Diagnosis not present

## 2018-11-03 ENCOUNTER — Ambulatory Visit: Payer: BLUE CROSS/BLUE SHIELD | Admitting: Family Medicine

## 2019-02-18 DIAGNOSIS — R635 Abnormal weight gain: Secondary | ICD-10-CM | POA: Diagnosis not present

## 2019-02-18 DIAGNOSIS — F900 Attention-deficit hyperactivity disorder, predominantly inattentive type: Secondary | ICD-10-CM | POA: Diagnosis not present

## 2019-02-18 DIAGNOSIS — F332 Major depressive disorder, recurrent severe without psychotic features: Secondary | ICD-10-CM | POA: Diagnosis not present

## 2019-02-18 DIAGNOSIS — E782 Mixed hyperlipidemia: Secondary | ICD-10-CM | POA: Diagnosis not present

## 2019-02-18 DIAGNOSIS — E8881 Metabolic syndrome: Secondary | ICD-10-CM | POA: Diagnosis not present

## 2019-02-18 DIAGNOSIS — R7989 Other specified abnormal findings of blood chemistry: Secondary | ICD-10-CM | POA: Diagnosis not present

## 2019-02-23 DIAGNOSIS — G5602 Carpal tunnel syndrome, left upper limb: Secondary | ICD-10-CM | POA: Diagnosis not present

## 2019-06-26 HISTORY — PX: CARPAL TUNNEL RELEASE: SHX101

## 2019-07-08 ENCOUNTER — Telehealth: Payer: Self-pay | Admitting: *Deleted

## 2019-07-08 NOTE — Telephone Encounter (Signed)
OK with me.

## 2019-07-08 NOTE — Telephone Encounter (Signed)
Copied from CRM [id number]. Topic: Appointment Scheduling - Transfer of Care >> Jul 08, 2019  8:48 AM Reggie Pile, NT wrote: Pt is requesting to transfer FROM: Dr.Kim/ [place] Pt is requesting to transfer TO: Melissa O'Sullivan/ [place] Reason for requested transfer: old PCP doing telemed only   Send CRM to patient's current PCP (transferring FROM).

## 2019-07-09 NOTE — Telephone Encounter (Signed)
ok 

## 2019-07-09 NOTE — Telephone Encounter (Signed)
Please call pt to schedule a new patient appointment with me.

## 2019-07-17 ENCOUNTER — Encounter: Payer: Self-pay | Admitting: Family

## 2019-07-17 ENCOUNTER — Ambulatory Visit: Payer: BC Managed Care – PPO | Admitting: Family

## 2019-07-17 ENCOUNTER — Other Ambulatory Visit: Payer: Self-pay

## 2019-07-17 VITALS — BP 154/91 | HR 58 | Temp 97.7°F | Resp 16 | Ht 72.0 in | Wt 288.0 lb

## 2019-07-17 DIAGNOSIS — I10 Essential (primary) hypertension: Secondary | ICD-10-CM

## 2019-07-17 DIAGNOSIS — F329 Major depressive disorder, single episode, unspecified: Secondary | ICD-10-CM

## 2019-07-17 DIAGNOSIS — F909 Attention-deficit hyperactivity disorder, unspecified type: Secondary | ICD-10-CM | POA: Diagnosis not present

## 2019-07-17 DIAGNOSIS — F32A Depression, unspecified: Secondary | ICD-10-CM

## 2019-07-17 LAB — BASIC METABOLIC PANEL
BUN: 12 mg/dL (ref 6–23)
CO2: 27 mEq/L (ref 19–32)
Calcium: 9.4 mg/dL (ref 8.4–10.5)
Chloride: 103 mEq/L (ref 96–112)
Creatinine, Ser: 0.81 mg/dL (ref 0.40–1.50)
GFR: 109.4 mL/min (ref >60.00)
Glucose, Bld: 83 mg/dL (ref 70–99)
Potassium: 4.3 mEq/L (ref 3.5–5.1)
Sodium: 138 mEq/L (ref 135–145)

## 2019-07-17 MED ORDER — AMLODIPINE BESYLATE 2.5 MG PO TABS: 2.5000 mg | ORAL_TABLET | Freq: Every day | ORAL | 3 refills | Status: DC

## 2019-07-17 MED ORDER — SERTRALINE HCL 50 MG PO TABS: ORAL_TABLET | ORAL | 0 refills | Status: DC

## 2019-07-17 MED ORDER — AMPHETAMINE-DEXTROAMPHETAMINE 5 MG PO TABS: 5.0000 mg | ORAL_TABLET | Freq: Two times a day (BID) | ORAL | 0 refills | Status: DC

## 2019-07-17 MED FILL — SERTRALINE HCL 50 MG TABLET: 50 | 30 days supply | Qty: 30 | Fill #0

## 2019-07-17 MED FILL — DEXTROAMP-AMPHETAMINE 5 MG: 5 | 30 days supply | Qty: 60 | Fill #0

## 2019-07-17 MED FILL — AMLODIPINE 2.5 MG TABLET: 2.5 | 30 days supply | Qty: 30 | Fill #0

## 2019-07-17 NOTE — Patient Instructions (Signed)
Stop methylphenidate, start adderall twice daily. Begin amlodipine 2.5mg  once daily. Restart Zoloft.  Complete lab work prior to leaving.
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
## 2019-07-18 LAB — PAIN MGMT, PROFILE 8 W/CONF, U
6 Acetylmorphine: NEGATIVE ng/mL
Alcohol Metabolites: NEGATIVE ng/mL (ref <500)
Amphetamines: NEGATIVE ng/mL
Benzodiazepines: NEGATIVE ng/mL
Buprenorphine, Urine: NEGATIVE ng/mL
Cocaine Metabolite: NEGATIVE ng/mL
Creatinine: 192.4 mg/dL
MDMA: NEGATIVE ng/mL
Marijuana Metabolite: NEGATIVE ng/mL
Opiates: NEGATIVE ng/mL
Oxidant: NEGATIVE ug/mL
Oxycodone: NEGATIVE ng/mL
pH: 6.4 (ref 4.5–9.0)

## 2019-08-14 ENCOUNTER — Ambulatory Visit: Payer: BC Managed Care – PPO | Admitting: Family

## 2019-08-17 ENCOUNTER — Ambulatory Visit: Payer: BC Managed Care – PPO | Admitting: Family

## 2019-08-17 ENCOUNTER — Other Ambulatory Visit: Payer: Self-pay

## 2019-08-17 ENCOUNTER — Encounter: Payer: Self-pay | Admitting: Family

## 2019-08-17 VITALS — BP 143/91 | HR 68 | Temp 97.7°F | Resp 16 | Ht 72.0 in | Wt 281.0 lb

## 2019-08-17 DIAGNOSIS — I959 Hypotension, unspecified: Secondary | ICD-10-CM | POA: Diagnosis not present

## 2019-08-17 DIAGNOSIS — F329 Major depressive disorder, single episode, unspecified: Secondary | ICD-10-CM | POA: Diagnosis not present

## 2019-08-17 DIAGNOSIS — F909 Attention-deficit hyperactivity disorder, unspecified type: Secondary | ICD-10-CM | POA: Diagnosis not present

## 2019-08-17 DIAGNOSIS — F32A Depression, unspecified: Secondary | ICD-10-CM

## 2019-08-17 MED ORDER — SERTRALINE HCL 50 MG PO TABS: 50.0000 mg | ORAL_TABLET | Freq: Every day | ORAL | 0 refills | Status: DC

## 2019-08-17 MED FILL — SERTRALINE HCL 50 MG TABLET: 50 | 30 days supply | Qty: 30 | Fill #0

## 2019-08-17 NOTE — Patient Instructions (Signed)
Restart amlodipine 2.5mg  once daily- check blood pressure once daily for 3 days and send me your reading via mychart. Continue zoloft 50mg  once daily.

## 2019-08-17 NOTE — Progress Notes (Signed)
Subjective:    Patient ID: John Faulkner, male    DOB: 1985/07/11, 34 y.o.   MRN: [id number]  HPI   Patient is a 34 yr old male who presents today for follow up. Last visit we addressed the following:  HTN- he was not on any bp meds last visit.  BP was elevated.  We added 2.5mg  of amlodipine once daily.  Ran out of his amlodipine 2 days ago.   Has had some shakiness.   Depression- Last visit he described lack of motivation and feeling overwhelmed.  We restarted his zoloft which he had run out of 2 months ago. Reports that he is feeling better overall.   Depression screen [facility] 2/9 07/17/2019  Decreased Interest 3  Down, Depressed, Hopeless 1  PHQ - 2 Score 4  Altered sleeping 2  Tired, decreased energy 1  Change in appetite 2  Feeling bad or failure about yourself  0  Trouble concentrating 1  Moving slowly or fidgety/restless 1  Suicidal thoughts 0  PHQ-9 Score 11  Difficult doing work/chores Somewhat difficult   Wt Readings from Last 3 Encounters:  08/17/19 281 lb (127.5 kg)  07/17/19 288 lb (130.6 kg)  08/01/18 286 lb 6 oz (129.9 kg)   Has been eating a bit better.     ADHD- he is maintained on methylphenidate. He was having PM concentration issues and we changed him to adderall 5mg  bid. Occasionally wears off in the afternoon but sometimes forgets the second dose.     Review of Systems See HPI  Past Medical History:  Diagnosis Date  . Acute calculous cholecystitis 08/13/2011  . Elevated blood pressure      Social History   Socioeconomic History  . Marital status: Married    Spouse name: Not on file  . Number of children: Not on file  . Years of education: Not on file  . Highest education level: Not on file  Occupational History  . Not on file  Tobacco Use  . Smoking status: Never Smoker  . Smokeless tobacco: Never Used  Substance and Sexual Activity  . Alcohol use: No  . Drug use: No  . Sexual activity: Not on file  Other Topics Concern  . 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 Difficulty of Paying Living Expenses: Not on file  Food Insecurity:   . Worried About Charity fundraiser in the Last Year: Not on file  . Ran Out of Food in the Last Year: Not on file  Transportation Needs:   . Lack of Transportation (Medical): Not on file  . Lack of Transportation (Non-Medical): Not on file  Physical Activity:   . Days of Exercise per Week: Not on file  . Minutes of Exercise per Session: Not on file  Stress:   . Feeling of Stress : Not on file  Social Connections:   . Frequency of Communication with Friends and Family: Not on file  . Frequency of Social Gatherings with Friends  and Family: Not on file  . Attends Religious Services: Not on file  . Active Member of Clubs or Organizations: Not on file  . Attends Banker Meetings: Not on file  . Marital Status: Not on file  Intimate Partner Violence:   . Fear of Current or Ex-Partner: Not on file  . Emotionally Abused: Not on file  . Physically Abused: Not on file  . Sexually Abused: Not on file    Past Surgical History:  Procedure Laterality Date  . CHOLECYSTECTOMY  08/13/2011   Procedure: LAPAROSCOPIC CHOLECYSTECTOMY WITH INTRAOPERATIVE CHOLANGIOGRAM;  Surgeon: Rulon Abide, DO;  Location: [facility] OR;  Service: General;  Laterality: N/A;     . TONSILLECTOMY  2001    Family History  Problem Relation Age of Onset  . COPD Father   . Diabetes Father   . Heart disease Father 81  . Stroke Father   . Diabetes Maternal Grandfather   . Diabetes Paternal Grandfather     No Known Allergies  Current Outpatient Medications on File Prior to  Visit  Medication Sig Dispense Refill  . amLODipine (NORVASC) 2.5 MG tablet Take 1 tablet (2.5 mg total) by mouth daily. 30 tablet 3  . amphetamine-dextroamphetamine (ADDERALL) 5 MG tablet Take 1 tablet (5 mg total) by mouth 2 (two) times daily with a meal. 60 tablet 0  . Naltrexone-buPROPion HCl (CONTRAVE PO) Take by mouth.    . sertraline (ZOLOFT) 50 MG tablet 1/2 tab by mouth once daily x 1 week, then increase to a full tablet once daily on week two 30 tablet 0   No current facility-administered medications on file prior to visit.    BP (!) 143/91 (BP Location: Right Arm, Patient Position: Sitting, Cuff Size: Large)   Pulse 68   Temp 97.7 F (36.5 C) (Temporal)   Resp 16   Ht 6' (1.829 m)   Wt 281 lb (127.5 kg)   SpO2 100%   BMI 38.11 kg/m       Objective:   Physical Exam Constitutional:      General: He is not in acute distress.    Appearance: He is well-developed.  HENT:     Head: Normocephalic and atraumatic.  Cardiovascular:     Rate and Rhythm: Normal rate and regular rhythm.     Heart sounds: No murmur.  Pulmonary:     Effort: Pulmonary effort is normal. No respiratory distress.     Breath sounds: Normal breath sounds. No wheezing or rales.  Skin:    General: Skin is warm and dry.  Neurological:     Mental Status: He is alert and oriented to person, place, and time.  Psychiatric:        Behavior: Behavior normal.        Thought Content: Thought content normal.           Assessment & Plan:  HTN- BP elevated today. Pt is advised as follows:  Restart amlodipine 2.5mg  once daily- check blood pressure once daily for 3 days and send me your reading via mychart.  Depression- improved. Continue current dose of zoloft.    ADHD- fair control with current dosing of adderall. He often forgets his PM dose. Suggested he set an alarm.   This visit occurred during the SARS-CoV-2 public health emergency.  Safety protocols were in place, including screening questions  prior to the visit, additional usage of staff PPE, and extensive cleaning of exam room while observing appropriate contact time as indicated for  disinfecting solutions.

## 2019-08-24 DIAGNOSIS — Z6838 Body mass index (BMI) 38.0-38.9, adult: Secondary | ICD-10-CM | POA: Diagnosis not present

## 2019-08-24 DIAGNOSIS — I10 Essential (primary) hypertension: Secondary | ICD-10-CM | POA: Diagnosis not present

## 2019-08-24 DIAGNOSIS — Z1383 Encounter for screening for respiratory disorder NEC: Secondary | ICD-10-CM | POA: Diagnosis not present

## 2019-08-24 DIAGNOSIS — Z13228 Encounter for screening for other metabolic disorders: Secondary | ICD-10-CM | POA: Diagnosis not present

## 2019-08-24 DIAGNOSIS — Z713 Dietary counseling and surveillance: Secondary | ICD-10-CM | POA: Diagnosis not present

## 2019-08-24 DIAGNOSIS — Z7182 Exercise counseling: Secondary | ICD-10-CM | POA: Diagnosis not present

## 2019-08-24 DIAGNOSIS — F330 Major depressive disorder, recurrent, mild: Secondary | ICD-10-CM | POA: Diagnosis not present

## 2019-08-24 DIAGNOSIS — Z1331 Encounter for screening for depression: Secondary | ICD-10-CM | POA: Diagnosis not present

## 2019-08-26 ENCOUNTER — Telehealth: Payer: Self-pay

## 2019-08-26 ENCOUNTER — Encounter: Payer: Self-pay | Admitting: Family

## 2019-08-31 ENCOUNTER — Other Ambulatory Visit: Payer: Self-pay | Admitting: Family

## 2019-08-31 DIAGNOSIS — E785 Hyperlipidemia, unspecified: Secondary | ICD-10-CM | POA: Diagnosis not present

## 2019-08-31 DIAGNOSIS — Z6838 Body mass index (BMI) 38.0-38.9, adult: Secondary | ICD-10-CM | POA: Diagnosis not present

## 2019-08-31 DIAGNOSIS — Z713 Dietary counseling and surveillance: Secondary | ICD-10-CM | POA: Diagnosis not present

## 2019-08-31 DIAGNOSIS — E669 Obesity, unspecified: Secondary | ICD-10-CM | POA: Diagnosis not present

## 2019-08-31 MED FILL — AMLODIPINE 2.5 MG TABLET: 2.5 | 30 days supply | Qty: 30 | Fill #1

## 2019-09-01 MED FILL — DEXTROAMP-AMPHETAMINE 5 MG: 5 | 30 days supply | Qty: 60 | Fill #0

## 2019-09-21 MED FILL — AMLODIPINE 2.5 MG TABLET: 2.5 | 30 days supply | Qty: 30 | Fill #1

## 2019-09-21 MED FILL — SERTRALINE HCL 50 MG TABLET: 50 | 30 days supply | Qty: 30 | Fill #1

## 2019-09-21 MED FILL — DEXTROAMP-AMPHETAMINE 5 MG: 5 | 30 days supply | Qty: 60 | Fill #0

## 2019-09-29 NOTE — Telephone Encounter (Signed)
No notes

## 2019-10-13 ENCOUNTER — Other Ambulatory Visit: Payer: Self-pay

## 2019-10-20 DIAGNOSIS — Z713 Dietary counseling and surveillance: Secondary | ICD-10-CM | POA: Diagnosis not present

## 2019-10-26 MED FILL — SERTRALINE HCL 50 MG TABS: 50 | 30 days supply | Qty: 30 | Fill #2

## 2019-10-26 MED FILL — AMLODIPINE 2.5 MG TABLET: 2.5 | 30 days supply | Qty: 30 | Fill #2

## 2019-10-28 DIAGNOSIS — F339 Major depressive disorder, recurrent, unspecified: Secondary | ICD-10-CM | POA: Diagnosis not present

## 2019-10-28 DIAGNOSIS — R5383 Other fatigue: Secondary | ICD-10-CM | POA: Diagnosis not present

## 2019-10-28 DIAGNOSIS — I10 Essential (primary) hypertension: Secondary | ICD-10-CM | POA: Diagnosis not present

## 2019-10-28 DIAGNOSIS — R41840 Attention and concentration deficit: Secondary | ICD-10-CM | POA: Diagnosis not present

## 2019-11-03 DIAGNOSIS — Z713 Dietary counseling and surveillance: Secondary | ICD-10-CM | POA: Diagnosis not present

## 2019-11-04 ENCOUNTER — Other Ambulatory Visit: Payer: Self-pay

## 2019-11-04 MED ORDER — SERTRALINE HCL 50 MG PO TABS: 50.0000 mg | ORAL_TABLET | Freq: Every day | ORAL | 0 refills | Status: DC

## 2019-11-13 ENCOUNTER — Encounter: Payer: BC Managed Care – PPO | Admitting: Family

## 2019-11-27 ENCOUNTER — Encounter: Payer: BC Managed Care – PPO | Admitting: Family

## 2019-11-30 ENCOUNTER — Other Ambulatory Visit: Payer: Self-pay

## 2019-11-30 MED ORDER — AMLODIPINE BESYLATE 2.5 MG PO TABS: 2.5000 mg | ORAL_TABLET | Freq: Every day | ORAL | 1 refills | Status: DC

## 2019-12-04 ENCOUNTER — Encounter: Payer: BC Managed Care – PPO | Admitting: Family

## 2019-12-07 ENCOUNTER — Ambulatory Visit (INDEPENDENT_AMBULATORY_CARE_PROVIDER_SITE_OTHER): Payer: BC Managed Care – PPO | Admitting: Family

## 2019-12-07 ENCOUNTER — Encounter: Payer: Self-pay | Admitting: Family

## 2019-12-07 ENCOUNTER — Other Ambulatory Visit: Payer: Self-pay

## 2019-12-07 VITALS — BP 131/75 | HR 64 | Temp 97.7°F | Resp 16 | Ht 72.0 in | Wt 286.0 lb

## 2019-12-07 DIAGNOSIS — F32A Depression, unspecified: Secondary | ICD-10-CM

## 2019-12-07 DIAGNOSIS — R7989 Other specified abnormal findings of blood chemistry: Secondary | ICD-10-CM | POA: Diagnosis not present

## 2019-12-07 DIAGNOSIS — Z0000 Encounter for general adult medical examination without abnormal findings: Secondary | ICD-10-CM

## 2019-12-07 DIAGNOSIS — F909 Attention-deficit hyperactivity disorder, unspecified type: Secondary | ICD-10-CM | POA: Diagnosis not present

## 2019-12-07 DIAGNOSIS — F329 Major depressive disorder, single episode, unspecified: Secondary | ICD-10-CM

## 2019-12-07 DIAGNOSIS — I10 Essential (primary) hypertension: Secondary | ICD-10-CM

## 2019-12-07 LAB — HEPATIC FUNCTION PANEL
ALT: 22 U/L (ref 0–53)
AST: 20 U/L (ref 0–37)
Albumin: 4.7 g/dL (ref 3.5–5.2)
Alkaline Phosphatase: 67 U/L (ref 39–117)
Bilirubin, Direct: 0.1 mg/dL (ref 0.0–0.3)
Total Bilirubin: 0.6 mg/dL (ref 0.2–1.2)
Total Protein: 7.2 g/dL (ref 6.0–8.3)

## 2019-12-07 LAB — BASIC METABOLIC PANEL
BUN: 13 mg/dL (ref 6–23)
CO2: 27 mEq/L (ref 19–32)
Calcium: 9.6 mg/dL (ref 8.4–10.5)
Chloride: 105 mEq/L (ref 96–112)
Creatinine, Ser: 0.82 mg/dL (ref 0.40–1.50)
GFR: 107.61 mL/min (ref >60.00)
Glucose, Bld: 85 mg/dL (ref 70–99)
Potassium: 4.9 mEq/L (ref 3.5–5.1)
Sodium: 138 mEq/L (ref 135–145)

## 2019-12-07 LAB — CBC WITH DIFFERENTIAL/PLATELET
Basophils Absolute: 0.1 10*3/uL (ref 0.0–0.1)
Basophils Relative: 0.8 % (ref 0.0–3.0)
Eosinophils Absolute: 0.1 10*3/uL (ref 0.0–0.7)
Eosinophils Relative: 1 % (ref 0.0–5.0)
HCT: 45.9 % (ref 39.0–52.0)
Hemoglobin: 15.7 g/dL (ref 13.0–17.0)
Lymphocytes Relative: 29 % (ref 12.0–46.0)
Lymphs Abs: 1.9 10*3/uL (ref 0.7–4.0)
MCHC: 34.2 g/dL (ref 30.0–36.0)
MCV: 91 fl (ref 78.0–100.0)
Monocytes Absolute: 0.6 10*3/uL (ref 0.1–1.0)
Monocytes Relative: 9.6 % (ref 3.0–12.0)
Neutro Abs: 4 10*3/uL (ref 1.4–7.7)
Neutrophils Relative %: 59.6 % (ref 43.0–77.0)
Platelets: 166 10*3/uL (ref 150.0–400.0)
RBC: 5.04 Mil/uL (ref 4.22–5.81)
RDW: 12.7 % (ref 11.5–15.5)
WBC: 6.7 10*3/uL (ref 4.0–10.5)

## 2019-12-07 LAB — LIPID PANEL
Cholesterol: 180 mg/dL (ref 0–200)
HDL: 45.3 mg/dL (ref >39.00)
LDL Cholesterol: 113 mg/dL — ABNORMAL HIGH (ref 0–99)
NonHDL: 134.24
Total CHOL/HDL Ratio: 4
Triglycerides: 108 mg/dL (ref 0.0–149.0)
VLDL: 21.6 mg/dL (ref 0.0–40.0)

## 2019-12-07 LAB — TSH: TSH: 1.74 u[IU]/mL (ref 0.35–4.50)

## 2019-12-07 MED ORDER — SERTRALINE HCL 50 MG PO TABS: 75.0000 mg | ORAL_TABLET | Freq: Every day | ORAL | 0 refills | Status: DC

## 2019-12-07 NOTE — Patient Instructions (Signed)
Please complete lab work prior to leaving. Increase zoloft to 75mg  once daily.

## 2019-12-07 NOTE — Progress Notes (Signed)
Subjective:    Patient ID: John Faulkner, male    DOB: 08-Aug-1985, 34 y.o.   MRN: [id number]  HPI  Patient presents today for complete physical.  Immunizations: tetanus 2014, covid vaccine Diet: needs some improvement.  Journals his food. Some days he will eat 4-5K calories.   Exercise:  Has started exercising (walking/elliptal machine) 3 times a week.   Using phentermine QOD per a weight loss clinic.  He has been taking the phentermine for >1 month.   Wt Readings from Last 3 Encounters:  12/07/19 286 lb (129.7 kg)  08/17/19 281 lb (127.5 kg)  07/17/19 288 lb (130.6 kg)    Depression-  Reports overall lack of motivation and feeling overwhelmed.  Does look forward to going to the park.    HTN-  BP Readings from Last 3 Encounters:  12/07/19 131/75  08/17/19 (!) 143/91  07/17/19 (!) 154/91   ADHD-  Reports that he has forgetfulness and "head in the clouds." Reports that he is independent at work.  Does well at work.    He was in a trial and noted to have low testosterone and was placed on clomid.    He [place].   Review of Systems  Constitutional: Negative for unexpected weight change.  HENT: Negative for hearing loss and rhinorrhea.   Eyes: Negative for visual disturbance.  Respiratory: Negative for cough.   Cardiovascular: Negative for chest pain.  Gastrointestinal: Negative for constipation and diarrhea.  Genitourinary: Negative for dysuria and frequency.  Musculoskeletal: Negative for arthralgias and myalgias.  Skin: Negative for rash.  Neurological: Negative for headaches.  Hematological: Negative for adenopathy.  Psychiatric/Behavioral:       See HPI   Past Medical History:  Diagnosis Date  . Acute calculous cholecystitis 08/13/2011  . Elevated blood pressure      Social History   Socioeconomic History  . Marital status: Married    Spouse name: Not on file  . Number of children: Not on file  . Years of  education: Not on file  . Highest education level: Not on file  Occupational History  . Not on file  Tobacco Use  . Smoking status: Never Smoker  . Smokeless tobacco: Never Used  Substance and Sexual Activity  . Alcohol use: No  . Drug use: No  . Sexual activity: Not on file  Other Topics Concern  . Not on file  Social History Narrative   Work or School:       Home Situation: lives with wife, 2 children   Daughter 2014   Son 2018      Has a Medical laboratory scientific officer a Nurse, mental health      Spiritual Beliefs: none      Lifestyle: no regular exercise; poor      Works for a Estate agent- runs a truck route      Social Determinants of Corporate investment banker Strain:   . Difficulty of Paying Living Expenses:   Food Insecurity:   . Worried About Programme researcher, broadcasting/film/video in the Last Year:   . Barista in the Last Year:   Transportation Needs:   . Freight forwarder (Medical):   Marland Kitchen Lack of Transportation (Non-Medical):   Physical Activity:   . Days of Exercise per Week:   . Minutes of Exercise per Session:   Stress:   . Feeling of Stress :   Social Connections:   . Frequency of  Communication with Friends and Family:   . Frequency of Social Gatherings with Friends and Family:   . Attends Religious Services:   . Active Member of Clubs or Organizations:   . Attends Archivist Meetings:   Marland Kitchen Marital Status:   Intimate Partner Violence:   . Fear of Current or Ex-Partner:   . Emotionally Abused:   Marland Kitchen Physically Abused:   . Sexually Abused:     Past Surgical History:  Procedure Laterality Date  . CHOLECYSTECTOMY  08/13/2011   Procedure: LAPAROSCOPIC CHOLECYSTECTOMY WITH INTRAOPERATIVE CHOLANGIOGRAM;  Surgeon: Judieth Keens, DO;  Location: [facility];  Service: General;  Laterality: N/A;     . TONSILLECTOMY  2001    Family History  Problem Relation Age of Onset  . COPD Father   . Diabetes Father   . Heart disease Father 47  . Stroke Father   . Diabetes Maternal  Grandfather   . Diabetes Paternal Grandfather     No Known Allergies  Current Outpatient Medications on File Prior to Visit  Medication Sig Dispense Refill  . amLODipine (NORVASC) 2.5 MG tablet Take 1 tablet (2.5 mg total) by mouth daily. 90 tablet 1  . clomiPHENE (CLOMID) 50 MG tablet Take 25 mg by mouth daily.    . phentermine (ADIPEX-P) 37.5 MG tablet Take 37.5 mg by mouth daily.     No current facility-administered medications on file prior to visit.    BP 131/75 (BP Location: Right Arm, Patient Position: Sitting, Cuff Size: Large)   Pulse 64   Temp 97.7 F (36.5 C) (Temporal)   Resp 16   Ht 6' (1.829 m)   Wt 286 lb (129.7 kg)   SpO2 100%   BMI 38.79 kg/m       Objective:   Physical Exam Constitutional:      General: He is not in acute distress.    Appearance: He is well-developed.  HENT:     Head: Normocephalic and atraumatic.     Right Ear: Tympanic membrane and ear canal normal.     Left Ear: Tympanic membrane and ear canal normal.  Eyes:     Pupils: Pupils are equal, round, and reactive to light.  Cardiovascular:     Rate and Rhythm: Normal rate and regular rhythm.     Heart sounds: No murmur heard.   Pulmonary:     Effort: Pulmonary effort is normal. No respiratory distress.     Breath sounds: Normal breath sounds. No wheezing or rales.  Skin:    General: Skin is warm and dry.  Neurological:     Mental Status: He is alert and oriented to person, place, and time.     Deep Tendon Reflexes:     Reflex Scores:      Patellar reflexes are 2+ on the right side and 2+ on the left side. Psychiatric:        Behavior: Behavior normal.        Thought Content: Thought content normal.           Assessment & Plan:  HTN- bp stable on current dose of amlodipine. Continue same.  Low testosterone- would like referral to urology for ongoing management.  Referral placed.   Depression- uncontrolled. Will increase zoloft.   ADHD- seems to be doing OK off of  adderall. I advised pt to d/c adderall due to use of phentermine.  Preventative care- discussed healthy diet, exercise and weight loss.  Immunizations reviewed and up to date.  This visit occurred during the SARS-CoV-2 public health emergency.  Safety protocols were in place, including screening questions prior to the visit, additional usage of staff PPE, and extensive cleaning of exam room while observing appropriate contact time as indicated for disinfecting solutions.

## 2019-12-10 DIAGNOSIS — F339 Major depressive disorder, recurrent, unspecified: Secondary | ICD-10-CM | POA: Diagnosis not present

## 2019-12-10 DIAGNOSIS — R5383 Other fatigue: Secondary | ICD-10-CM | POA: Diagnosis not present

## 2019-12-10 DIAGNOSIS — I10 Essential (primary) hypertension: Secondary | ICD-10-CM | POA: Diagnosis not present

## 2019-12-25 ENCOUNTER — Encounter: Payer: Self-pay | Admitting: Family

## 2019-12-25 ENCOUNTER — Other Ambulatory Visit: Payer: Self-pay

## 2019-12-25 ENCOUNTER — Ambulatory Visit (INDEPENDENT_AMBULATORY_CARE_PROVIDER_SITE_OTHER): Payer: BC Managed Care – PPO | Admitting: Family

## 2019-12-25 VITALS — BP 125/80 | HR 65 | Temp 97.7°F | Resp 16 | Ht 72.0 in | Wt 292.0 lb

## 2019-12-25 DIAGNOSIS — Z4802 Encounter for removal of sutures: Secondary | ICD-10-CM | POA: Diagnosis not present

## 2019-12-25 DIAGNOSIS — R1013 Epigastric pain: Secondary | ICD-10-CM | POA: Diagnosis not present

## 2019-12-25 LAB — CBC WITH DIFFERENTIAL/PLATELET
Basophils Absolute: 0.1 K/uL (ref 0.0–0.1)
Basophils Relative: 0.9 % (ref 0.0–3.0)
Eosinophils Absolute: 0.1 K/uL (ref 0.0–0.7)
Eosinophils Relative: 1.6 % (ref 0.0–5.0)
HCT: 46.9 % (ref 39.0–52.0)
Hemoglobin: 15.8 g/dL (ref 13.0–17.0)
Lymphocytes Relative: 33.4 % (ref 12.0–46.0)
Lymphs Abs: 2.5 K/uL (ref 0.7–4.0)
MCHC: 33.8 g/dL (ref 30.0–36.0)
MCV: 91 fl (ref 78.0–100.0)
Monocytes Absolute: 0.7 K/uL (ref 0.1–1.0)
Monocytes Relative: 9.7 % (ref 3.0–12.0)
Neutro Abs: 4 K/uL (ref 1.4–7.7)
Neutrophils Relative %: 54.4 % (ref 43.0–77.0)
Platelets: 178 K/uL (ref 150.0–400.0)
RBC: 5.15 Mil/uL (ref 4.22–5.81)
RDW: 12.8 % (ref 11.5–15.5)
WBC: 7.4 K/uL (ref 4.0–10.5)

## 2019-12-25 LAB — HEPATIC FUNCTION PANEL
ALT: 17 U/L (ref 0–53)
AST: 16 U/L (ref 0–37)
Albumin: 4.4 g/dL (ref 3.5–5.2)
Alkaline Phosphatase: 62 U/L (ref 39–117)
Bilirubin, Direct: 0.1 mg/dL (ref 0.0–0.3)
Total Bilirubin: 0.5 mg/dL (ref 0.2–1.2)
Total Protein: 6.9 g/dL (ref 6.0–8.3)

## 2019-12-25 LAB — LIPASE: Lipase: 17 U/L (ref 11.0–59.0)

## 2019-12-25 MED ORDER — OMEPRAZOLE 40 MG PO CPDR: 40.0000 mg | DELAYED_RELEASE_CAPSULE | Freq: Every day | ORAL | 3 refills | Status: DC

## 2019-12-25 NOTE — Progress Notes (Signed)
Subjective:    Patient ID: John Faulkner, male    DOB: 11-18-1985, 34 y.o.   MRN: [id number]  HPI  Patient is a 34 yr old male who presents today for removal of sutures following 6/23 for left carpal tunnel release.  This was performed in [place].  He reports no problems postoperatively.  Specifically he denies numbness in the left hand.  He does have some tenderness to palpation at the base of the right hand on the palmar surface.  He had a post op e-visit yesterday with the surgeon.  The surgeon was pleased with his progress and wanted to make sure he had an appointment scheduled for suture removal.  Separately he reports an episode of epigastric pain which began approximately 4 days ago.  It was associated with nausea.  He continues to have intermittent symptoms.  Denies vomiting.  He is status post cholecystectomy.   .Review of Systems See HPI  Past Medical History:  Diagnosis Date  . Acute calculous cholecystitis 08/13/2011  . Elevated blood pressure      Social History   Socioeconomic History  . Marital status: Married    Spouse name: Not on file  . Number of children: Not on file  . Years of education: Not on file  . Highest education level: Not on file  Occupational History  . Not on file  Tobacco Use  . Smoking status: Never Smoker  . Smokeless tobacco: Never Used  Substance and Sexual Activity  . Alcohol use: No  . Drug use: No  . Sexual activity: Not on file  Other Topics Concern  . Not on file  Social History Narrative   Work or School:       Home Situation: lives with wife, 2 children   Daughter 2014   Son 2018      Has a Medical laboratory scientific officer a Nurse, mental health      Spiritual Beliefs: none      Lifestyle: no regular exercise; poor      Works for a Estate agent- runs a truck route      Social Determinants of Corporate investment banker Strain:   . Difficulty of Paying Living Expenses:   Food Insecurity:   . Worried About Programme researcher, broadcasting/film/video in the Last Year:    . Barista in the Last Year:   Transportation Needs:   . Freight forwarder (Medical):   Marland Kitchen Lack of Transportation (Non-Medical):   Physical Activity:   . Days of Exercise per Week:   . Minutes of Exercise per Session:   Stress:   . Feeling of Stress :   Social Connections:   . Frequency of Communication with Friends and Family:   . Frequency of Social Gatherings with Friends and Family:   . Attends Religious Services:   . Active Member of Clubs or Organizations:   . Attends Banker Meetings:   Marland Kitchen Marital Status:   Intimate Partner Violence:   . Fear of Current or Ex-Partner:   . Emotionally Abused:   Marland Kitchen Physically Abused:   . Sexually Abused:     Past Surgical History:  Procedure Laterality Date  . CHOLECYSTECTOMY  08/13/2011   Procedure: LAPAROSCOPIC CHOLECYSTECTOMY WITH INTRAOPERATIVE CHOLANGIOGRAM;  Surgeon: Rulon Abide, DO;  Location: [facility] OR;  Service: General;  Laterality: N/A;     . TONSILLECTOMY  2001    Family History  Problem Relation Age of Onset  . COPD Father   .  Diabetes Father   . Heart disease Father 37  . Stroke Father   . Diabetes Maternal Grandfather   . Diabetes Paternal Grandfather     No Known Allergies  Current Outpatient Medications on File Prior to Visit  Medication Sig Dispense Refill  . amLODipine (NORVASC) 2.5 MG tablet Take 1 tablet (2.5 mg total) by mouth daily. 90 tablet 1  . clomiPHENE (CLOMID) 50 MG tablet Take 25 mg by mouth daily.    . phentermine (ADIPEX-P) 37.5 MG tablet Take 37.5 mg by mouth daily.    . sertraline (ZOLOFT) 50 MG tablet Take 1.5 tablets (75 mg total) by mouth daily. 135 tablet 0   No current facility-administered medications on file prior to visit.    BP 125/80 (BP Location: Right Arm, Patient Position: Sitting, Cuff Size: Large)   Pulse 65   Temp 97.7 F (36.5 C) (Temporal)   Resp 16   Ht 6' (1.829 m)   Wt 292 lb (132.5 kg)   SpO2 100%   BMI 39.60 kg/m       Objective:    Physical Exam Constitutional:      General: He is not in acute distress.    Appearance: He is well-developed.  HENT:     Head: Normocephalic and atraumatic.  Cardiovascular:     Rate and Rhythm: Normal rate and regular rhythm.     Heart sounds: No murmur heard.   Pulmonary:     Effort: Pulmonary effort is normal. No respiratory distress.     Breath sounds: Normal breath sounds. No wheezing or rales.  Abdominal:     General: Bowel sounds are normal. There is no distension.     Tenderness: There is abdominal tenderness (no guarding or rebound) in the epigastric area and left upper quadrant.  Skin:    General: Skin is warm and dry.     Comments: 4 sutures noted on left anterior wrist.  Incision appears well-healed without erythema or drainage.  Neurological:     Mental Status: He is alert and oriented to person, place, and time.  Psychiatric:        Behavior: Behavior normal.        Thought Content: Thought content normal.           Assessment & Plan:  Epigastric pain differential includes gastroenteritis, gastritis/ulcer, pancreatitis, gastritis.  Will obtain LFT, lipase, and H. pylori breath test.  Will initiate empiric omeprazole 40 mg once daily.  Patient is advised to call if symptoms worsen and to go to the emergency department if severe symptoms.  We will see how the above testing turns out and how he responds to the proton pump inhibitor.  If symptoms persist and above work-up is unrevealing, will consider abdominal imaging and referral to GI.  Suture removal-4 sutures removed without difficulty.  Patient tolerated procedure well.  This visit occurred during the SARS-CoV-2 public health emergency.  Safety protocols were in place, including screening questions prior to the visit, additional usage of staff PPE, and extensive cleaning of exam room while observing appropriate contact time as indicated for disinfecting solutions.

## 2019-12-25 NOTE — Patient Instructions (Signed)
Please complete lab work prior to leaving. Start omeprazole.  

## 2019-12-28 LAB — H. PYLORI BREATH TEST: H. pylori Breath Test: NOT DETECTED

## 2020-01-04 ENCOUNTER — Ambulatory Visit: Payer: BC Managed Care – PPO | Admitting: Family

## 2020-01-08 ENCOUNTER — Ambulatory Visit: Payer: BC Managed Care – PPO | Admitting: Family

## 2020-02-10 DIAGNOSIS — E291 Testicular hypofunction: Secondary | ICD-10-CM | POA: Diagnosis not present

## 2020-03-01 DIAGNOSIS — E291 Testicular hypofunction: Secondary | ICD-10-CM | POA: Diagnosis not present

## 2020-03-10 ENCOUNTER — Other Ambulatory Visit: Payer: Self-pay | Admitting: Family

## 2020-03-15 ENCOUNTER — Encounter: Payer: Self-pay | Admitting: Family

## 2020-03-15 DIAGNOSIS — Z20822 Contact with and (suspected) exposure to covid-19: Secondary | ICD-10-CM | POA: Diagnosis not present

## 2020-03-17 DIAGNOSIS — E291 Testicular hypofunction: Secondary | ICD-10-CM | POA: Diagnosis not present

## 2020-04-01 DIAGNOSIS — E291 Testicular hypofunction: Secondary | ICD-10-CM | POA: Diagnosis not present

## 2020-04-20 DIAGNOSIS — E291 Testicular hypofunction: Secondary | ICD-10-CM | POA: Diagnosis not present

## 2020-04-29 ENCOUNTER — Ambulatory Visit: Payer: BC Managed Care – PPO | Admitting: Family

## 2020-04-29 ENCOUNTER — Other Ambulatory Visit: Payer: Self-pay

## 2020-04-29 ENCOUNTER — Encounter: Payer: Self-pay | Admitting: Family

## 2020-04-29 VITALS — BP 140/92 | HR 60 | Temp 98.5°F | Resp 16 | Ht 72.0 in | Wt 296.0 lb

## 2020-04-29 DIAGNOSIS — F509 Eating disorder, unspecified: Secondary | ICD-10-CM | POA: Diagnosis not present

## 2020-04-29 DIAGNOSIS — R7989 Other specified abnormal findings of blood chemistry: Secondary | ICD-10-CM

## 2020-04-29 DIAGNOSIS — F32A Depression, unspecified: Secondary | ICD-10-CM

## 2020-04-29 DIAGNOSIS — I10 Essential (primary) hypertension: Secondary | ICD-10-CM | POA: Diagnosis not present

## 2020-04-29 DIAGNOSIS — F909 Attention-deficit hyperactivity disorder, unspecified type: Secondary | ICD-10-CM | POA: Diagnosis not present

## 2020-04-29 DIAGNOSIS — R1013 Epigastric pain: Secondary | ICD-10-CM

## 2020-04-29 LAB — BASIC METABOLIC PANEL
BUN: 9 mg/dL (ref 7–25)
CO2: 29 mmol/L (ref 20–32)
Calcium: 9.2 mg/dL (ref 8.6–10.3)
Chloride: 105 mmol/L (ref 98–110)
Creat: 0.73 mg/dL (ref 0.60–1.35)
Glucose, Bld: 91 mg/dL (ref 65–99)
Potassium: 4.6 mmol/L (ref 3.5–5.3)
Sodium: 139 mmol/L (ref 135–146)

## 2020-04-29 MED ORDER — AMLODIPINE BESYLATE 5 MG PO TABS: 5.0000 mg | ORAL_TABLET | Freq: Every day | ORAL | 1 refills | Status: DC

## 2020-04-29 MED ORDER — LISDEXAMFETAMINE DIMESYLATE 30 MG PO CAPS: 30.0000 mg | ORAL_CAPSULE | Freq: Every day | ORAL | 0 refills | Status: DC

## 2020-04-29 NOTE — Progress Notes (Signed)
Subjective:    Patient ID: John Faulkner, male    DOB: 01-30-1986, 34 y.o.   MRN: [id number]  HPI  Patient is a 34 yr old male who presents today to discuss referral to bariatrics.  HTN- patient is maintained on amlodipine 2.5 mg. BP Readings from Last 3 Encounters:  04/29/20 (!) 140/92  12/25/19 125/80  12/07/19 131/75   Morbid obesity- max weight 350.  He is working with a Systems analyst.  Diet is still out of control.  Feels like he has always had issues with yo-yo weight.  Reports that he binge eats, never feels satisfied.     Wt Readings from Last 3 Encounters:  04/29/20 296 lb (134.3 kg)  12/25/19 292 lb (132.5 kg)  12/07/19 286 lb (129.7 kg)    Hypogonadism- he is no longer on clomid, but is now receiving testosterone injections from his urologist.   Epigastric pain- occurred over the summer.  We treated him with omeprazole.  He never took the rx but symptoms resolved.   ADHD- not currently on medication. Reports that he does have trouble completing tasks.  Previously on adderall.   Depression- no longer taking zoloft.  He is now on Lexapro 10 mg once daily.  He reports significant improvement in his mood.   Review of Systems See HPI  Past Medical History:  Diagnosis Date  . Acute calculous cholecystitis 08/13/2011  . Elevated blood pressure      Social History   Socioeconomic History  . Marital status: Married    Spouse name: Not on file  . Number of children: Not on file  . Years of education: Not on file  . Highest education level: Not on file  Occupational History  . Not on file  Tobacco Use  . Smoking status: Never Smoker  . Smokeless tobacco: Never Used  Substance and Sexual Activity  . Alcohol use: No  . Drug use: No  . Sexual activity: Not on file  Other Topics Concern  . Not on file  Social History Narrative   Work or School:       Home Situation: lives with wife, 2 children   Daughter 2014   Son 2018      Has a Medical laboratory scientific officer a Nurse, mental health       Spiritual Beliefs: none      Lifestyle: no regular exercise; poor      Works for a Estate agent- runs a truck route      Social Determinants of Corporate investment banker Strain:   . Difficulty of Paying Living Expenses: Not on file  Food Insecurity:   . Worried About Programme researcher, broadcasting/film/video in the Last Year: Not on file  . Ran Out of Food in the Last Year: Not on file  Transportation Needs:   . Lack of Transportation (Medical): Not on file  . Lack of Transportation (Non-Medical): Not on file  Physical Activity:   . Days of Exercise per Week: Not on file  . Minutes of Exercise per Session: Not on file  Stress:   . Feeling of Stress : Not on file  Social Connections:   . Frequency of Communication with Friends and Family: Not on file  . Frequency of Social Gatherings with Friends and Family: Not on file  . Attends Religious Services: Not on file  . Active Member of Clubs or Organizations: Not on file  . Attends Banker Meetings: Not on file  . Marital Status: Not  on file  Intimate Partner Violence:   . Fear of Current or Ex-Partner: Not on file  . Emotionally Abused: Not on file  . Physically Abused: Not on file  . Sexually Abused: Not on file    Past Surgical History:  Procedure Laterality Date  . CHOLECYSTECTOMY  08/13/2011   Procedure: LAPAROSCOPIC CHOLECYSTECTOMY WITH INTRAOPERATIVE CHOLANGIOGRAM;  Surgeon: Rulon Abide, DO;  Location: [facility] OR;  Service: General;  Laterality: N/A;     . TONSILLECTOMY  2001    Family History  Problem Relation Age of Onset  . COPD Father   . Diabetes Father   . Heart disease Father 15  . Stroke Father   . Diabetes Maternal Grandfather   . Diabetes Paternal Grandfather     No Known Allergies  Current Outpatient Medications on File Prior to Visit  Medication Sig Dispense Refill  . amLODipine (NORVASC) 2.5 MG tablet Take 1 tablet (2.5 mg total) by mouth daily. 90 tablet 1  . escitalopram (LEXAPRO) 10  MG tablet Take 10 mg by mouth daily.    Marland Kitchen testosterone cypionate (DEPOTESTOSTERONE CYPIONATE) 200 MG/ML injection Inject 200 mg into the muscle every 14 (fourteen) days.     No current facility-administered medications on file prior to visit.    BP (!) 140/92 (BP Location: Right Arm, Patient Position: Sitting, Cuff Size: Large)   Pulse 60   Temp 98.5 F (36.9 C) (Oral)   Resp 16   Ht 6' (1.829 m)   Wt 296 lb (134.3 kg)   SpO2 98%   BMI 40.14 kg/m       Objective:   Physical Exam Constitutional:      General: He is not in acute distress.    Appearance: He is well-developed.  HENT:     Head: Normocephalic and atraumatic.  Cardiovascular:     Rate and Rhythm: Normal rate and regular rhythm.     Heart sounds: No murmur heard.   Pulmonary:     Effort: Pulmonary effort is normal. No respiratory distress.     Breath sounds: Normal breath sounds. No wheezing or rales.  Skin:    General: Skin is warm and dry.  Neurological:     Mental Status: He is alert and oriented to person, place, and time.  Psychiatric:        Behavior: Behavior normal.        Thought Content: Thought content normal.           Assessment & Plan:  Hypertension-blood pressure is above goal.  Will increase amlodipine from 2.5 to 5 mg p.o. daily.  Morbid obesity-a referral was placed to bariatric surgery.  Binge eating disorder-we will give trial of Vyvanse.  Monitor blood pressure carefully on this medication.  ADHD-uncontrolled.  Trial of Vyvanse.  A controlled substance contract is signed today and the patient will provide a urine drug screen per protocol.  Depression-patient is much improved on Lexapro 10 mg once daily.  Continue same.  Epigastric pain- resolved. Monitor.  Hypogonadism-clinically stable on testosterone- management per urology.  This visit occurred during the SARS-CoV-2 public health emergency.  Safety protocols were in place, including screening questions prior to the visit,  additional usage of staff PPE, and extensive cleaning of exam room while observing appropriate contact time as indicated for disinfecting solutions.

## 2020-04-29 NOTE — Patient Instructions (Signed)
Please increase amlodipine from 2.5mg  to 5mg .  Start vyvanse 30mg  once daily.

## 2020-04-30 LAB — DRUG MONITORING, PANEL 8 WITH CONFIRMATION, URINE
6 Acetylmorphine: NEGATIVE ng/mL (ref <10)
Alcohol Metabolites: NEGATIVE ng/mL
Amphetamines: NEGATIVE ng/mL (ref <500)
Benzodiazepines: NEGATIVE ng/mL (ref <100)
Buprenorphine, Urine: NEGATIVE ng/mL (ref <5)
Cocaine Metabolite: NEGATIVE ng/mL (ref <150)
Creatinine: 244.4 mg/dL
MDMA: NEGATIVE ng/mL (ref <500)
Marijuana Metabolite: NEGATIVE ng/mL (ref <20)
Opiates: NEGATIVE ng/mL (ref <100)
Oxidant: NEGATIVE ug/mL
Oxycodone: NEGATIVE ng/mL (ref <100)
pH: 5.4 (ref 4.5–9.0)

## 2020-04-30 LAB — DM TEMPLATE

## 2020-05-04 DIAGNOSIS — E291 Testicular hypofunction: Secondary | ICD-10-CM | POA: Diagnosis not present

## 2020-05-05 ENCOUNTER — Other Ambulatory Visit: Payer: Self-pay | Admitting: Family

## 2020-05-11 ENCOUNTER — Other Ambulatory Visit: Payer: Self-pay | Admitting: *Deleted

## 2020-05-11 MED ORDER — AMLODIPINE BESYLATE 5 MG PO TABS
5.0000 mg | ORAL_TABLET | Freq: Every day | ORAL | 1 refills | Status: DC
Start: 2020-05-11 — End: 2020-06-10

## 2020-05-25 DIAGNOSIS — E291 Testicular hypofunction: Secondary | ICD-10-CM | POA: Diagnosis not present

## 2020-05-27 ENCOUNTER — Ambulatory Visit: Payer: BC Managed Care – PPO | Admitting: Family

## 2020-06-06 ENCOUNTER — Telehealth: Payer: Self-pay | Admitting: *Deleted

## 2020-06-06 NOTE — Telephone Encounter (Signed)
[facility].  I do not seen where you have ever filled for him.  Are you ok with refill?

## 2020-06-07 MED ORDER — ESCITALOPRAM OXALATE 10 MG PO TABS: 10.0000 mg | ORAL_TABLET | Freq: Every day | ORAL | 1 refills | Status: DC

## 2020-06-10 ENCOUNTER — Encounter: Payer: Self-pay | Admitting: Family

## 2020-06-10 ENCOUNTER — Ambulatory Visit: Payer: BC Managed Care – PPO | Admitting: Family

## 2020-06-10 ENCOUNTER — Other Ambulatory Visit: Payer: Self-pay

## 2020-06-10 VITALS — BP 145/79 | HR 63 | Temp 98.2°F | Ht 72.0 in | Wt 301.0 lb

## 2020-06-10 DIAGNOSIS — I10 Essential (primary) hypertension: Secondary | ICD-10-CM

## 2020-06-10 DIAGNOSIS — F909 Attention-deficit hyperactivity disorder, unspecified type: Secondary | ICD-10-CM

## 2020-06-10 DIAGNOSIS — E291 Testicular hypofunction: Secondary | ICD-10-CM | POA: Diagnosis not present

## 2020-06-10 DIAGNOSIS — F5081 Binge eating disorder: Secondary | ICD-10-CM | POA: Diagnosis not present

## 2020-06-10 MED ORDER — LISDEXAMFETAMINE DIMESYLATE 30 MG PO CAPS: 30.0000 mg | ORAL_CAPSULE | Freq: Every day | ORAL | 0 refills | Status: DC

## 2020-06-10 MED ORDER — AMLODIPINE BESYLATE 10 MG PO TABS: 10.0000 mg | ORAL_TABLET | Freq: Every day | ORAL | 0 refills | Status: DC

## 2020-06-10 NOTE — Progress Notes (Signed)
Subjective:    Patient ID: John Faulkner, male    DOB: [birth year], 34 y.o.   MRN: [id number]  HPI  Patient is a 34 yr old male who presents today for follow up.  HTN- last visit we increased amlodipine from 2.5mg  to 5mg . He reports good compliance with this medication.   BP Readings from Last 3 Encounters:  06/10/20 (!) 145/79  04/29/20 (!) 140/92  12/25/19 125/80   Binge Eating disorder- last visit we gave a trial of Vyvanse.  He states that he cannot remember binging since he started vyvanse.  Wt Readings from Last 3 Encounters:  06/10/20 (!) 301 lb (136.5 kg)  04/29/20 296 lb (134.3 kg)  12/25/19 292 lb (132.5 kg)   ADHD- trial of Vyvanse last visit. Reports that focus is improved on vyvanse- he is pleased with his response.  Morbid obesity- undergoing evaluation for bariatric surgery. States that his previous weight loss attempts have included:  Keto- 25 pounds the first month but came right back after he stopped.  Diet/exercise Medically supervised weight loss 2019 (phentermine- [facility]) x 6 months- he lost 50 pounds   Review of Systems    see HPI  Past Medical History:  Diagnosis Date  . Acute calculous cholecystitis 08/13/2011  . Elevated blood pressure   . Nephrotic syndrome 1989     Social History   Socioeconomic History  . Marital status: Married    Spouse name: Not on file  . Number of children: Not on file  . Years of education: Not on file  . Highest education level: Not on file  Occupational History  . Not on file  Tobacco Use  . Smoking status: Never Smoker  . Smokeless tobacco: Never Used  Substance and Sexual Activity  . Alcohol use: No  . Drug use: No  . Sexual activity: Not on file  Other Topics Concern  . Not on file  Social History Narrative   Work or School:       Home Situation: lives with wife, 2 children   Daughter 2014   Son 2018      Has a 2019 a Medical laboratory scientific officer      Spiritual Beliefs: none      Lifestyle: no regular exercise;  poor      Works for a Nurse, mental health- runs a truck route      Social Determinants of Estate agent Strain: Not on Corporate investment banker Insecurity: Not on file  Transportation Needs: Not on file  Physical Activity: Not on file  Stress: Not on file  Social Connections: Not on file  Intimate Partner Violence: Not on file    Past Surgical History:  Procedure Laterality Date  . CHOLECYSTECTOMY  08/13/2011   Procedure: LAPAROSCOPIC CHOLECYSTECTOMY WITH INTRAOPERATIVE CHOLANGIOGRAM;  Surgeon: 08/15/2011, DO;  Location: [facility] OR;  Service: General;  Laterality: N/A;     . TONSILLECTOMY  2001    Family History  Problem Relation Age of Onset  . COPD Father   . Diabetes Father   . Heart disease Father 46  . Stroke Father   . Diabetes Maternal Grandfather   . Diabetes Paternal Grandfather     No Known Allergies  Current Outpatient Medications on File Prior to Visit  Medication Sig Dispense Refill  . amLODipine (NORVASC) 5 MG tablet Take 1 tablet (5 mg total) by mouth daily. 90 tablet 1  . escitalopram (LEXAPRO) 10 MG tablet Take 1 tablet (10 mg total)  by mouth daily. 90 tablet 1  . lisdexamfetamine (VYVANSE) 30 MG capsule Take 1 capsule (30 mg total) by mouth daily. 30 capsule 0  . testosterone cypionate (DEPOTESTOSTERONE CYPIONATE) 200 MG/ML injection Inject 200 mg into the muscle every 14 (fourteen) days.     No current facility-administered medications on file prior to visit.    BP (!) 145/79 (BP Location: Right Arm, Patient Position: Sitting, Cuff Size: Large)   Pulse 63   Temp 98.2 F (36.8 C) (Oral)   Ht 6' (1.829 m)   Wt (!) 301 lb (136.5 kg)   SpO2 98%   BMI 40.82 kg/m    Objective:   Physical Exam Constitutional:      General: He is not in acute distress.    Appearance: He is well-developed and well-nourished.  HENT:     Head: Normocephalic and atraumatic.  Cardiovascular:     Rate and Rhythm: Normal rate and regular rhythm.     Heart  sounds: No murmur heard.   Pulmonary:     Effort: Pulmonary effort is normal. No respiratory distress.     Breath sounds: Normal breath sounds. No wheezing or rales.  Musculoskeletal:        General: No edema.  Skin:    General: Skin is warm and dry.  Neurological:     Mental Status: He is alert and oriented to person, place, and time.  Psychiatric:        Mood and Affect: Mood and affect normal.        Behavior: Behavior normal.        Thought Content: Thought content normal.           Assessment & Plan:  ADHD- stable on vyvanse, continue same.  HTN- still above goal. Will increase amlodipine from 5mg  to 10mg  once daily.  Binge eating disorder- some improvement in his binging despite the fact that he has gained a few pounds since last visit. Continue vyvanse.  Morbid obesity- he brings paperwork for his bariatric clinic to be completed. Will complete and return to his surgeon.  This visit occurred during the SARS-CoV-2 public health emergency.  Safety protocols were in place, including screening questions prior to the visit, additional usage of staff PPE, and extensive cleaning of exam room while observing appropriate contact time as indicated for disinfecting solutions.

## 2020-06-10 NOTE — Patient Instructions (Signed)
Please increase amlodipine to 10mg.   

## 2020-06-15 ENCOUNTER — Encounter: Payer: Self-pay | Admitting: Family

## 2020-06-15 ENCOUNTER — Telehealth: Payer: Self-pay | Admitting: Family

## 2020-06-15 NOTE — Telephone Encounter (Signed)
Please fax bariatric paperwork/medical support letter to [facility] surgery.

## 2020-06-15 NOTE — Telephone Encounter (Signed)
Forms faxed

## 2020-06-23 ENCOUNTER — Telehealth: Payer: Self-pay | Admitting: *Deleted

## 2020-06-23 NOTE — Telephone Encounter (Signed)
[facility] and wanted to make sure that he was using them.  We just sent in new rx to local pharmacy on 12/14.   Left message on machine for patient to call back.

## 2020-07-01 DIAGNOSIS — E291 Testicular hypofunction: Secondary | ICD-10-CM | POA: Diagnosis not present

## 2020-07-08 ENCOUNTER — Ambulatory Visit: Payer: BC Managed Care – PPO | Admitting: Family

## 2020-07-14 ENCOUNTER — Other Ambulatory Visit (HOSPITAL_COMMUNITY): Payer: Self-pay | Admitting: Surgery

## 2020-07-14 DIAGNOSIS — F909 Attention-deficit hyperactivity disorder, unspecified type: Secondary | ICD-10-CM | POA: Diagnosis not present

## 2020-07-14 DIAGNOSIS — I10 Essential (primary) hypertension: Secondary | ICD-10-CM | POA: Diagnosis not present

## 2020-07-14 DIAGNOSIS — F32A Depression, unspecified: Secondary | ICD-10-CM | POA: Diagnosis not present

## 2020-07-15 DIAGNOSIS — E291 Testicular hypofunction: Secondary | ICD-10-CM | POA: Diagnosis not present

## 2020-07-21 ENCOUNTER — Other Ambulatory Visit (HOSPITAL_BASED_OUTPATIENT_CLINIC_OR_DEPARTMENT_OTHER): Payer: Self-pay

## 2020-07-27 ENCOUNTER — Other Ambulatory Visit: Payer: Self-pay

## 2020-07-27 ENCOUNTER — Ambulatory Visit (INDEPENDENT_AMBULATORY_CARE_PROVIDER_SITE_OTHER): Payer: BC Managed Care – PPO | Admitting: Family

## 2020-07-27 VITALS — BP 143/79 | HR 71 | Temp 98.4°F | Resp 16 | Ht 73.0 in | Wt 306.0 lb

## 2020-07-27 DIAGNOSIS — F5081 Binge eating disorder: Secondary | ICD-10-CM | POA: Diagnosis not present

## 2020-07-27 DIAGNOSIS — F909 Attention-deficit hyperactivity disorder, unspecified type: Secondary | ICD-10-CM

## 2020-07-27 DIAGNOSIS — I10 Essential (primary) hypertension: Secondary | ICD-10-CM | POA: Diagnosis not present

## 2020-07-27 MED ORDER — LISDEXAMFETAMINE DIMESYLATE 50 MG PO CAPS: 50.0000 mg | ORAL_CAPSULE | Freq: Every day | ORAL | 0 refills | Status: DC

## 2020-07-27 MED ORDER — NEOMYCIN-POLYMYXIN-HC 3.5-10000-1 OT SOLN: 4.0000 [drp] | Freq: Four times a day (QID) | OTIC | 0 refills | Status: AC

## 2020-07-27 MED ORDER — LISINOPRIL 10 MG PO TABS
10.0000 mg | ORAL_TABLET | Freq: Every day | ORAL | 0 refills | Status: DC
Start: 2020-07-27 — End: 2020-10-11

## 2020-07-27 NOTE — Patient Instructions (Signed)
Begin ear drops to the right ear 4 times daily. Call if increased pain/swelling or if no improvement in 3-4 days. You may use tylenol as needed for pain. Add lisinopril 10mg  once daily for blood pressure. Increase vyvanse to 50mg  once daily.

## 2020-07-27 NOTE — Progress Notes (Signed)
Subjective:    Patient ID: John Faulkner, male    DOB: 04-15-1986, 35 y.o.   MRN: [id number]  HPI  Pt is a 35 yr old male who presents today for follow up.  HTN- He states that he increased his amlodipine on his own from 10mg  to 15mg .  BP Readings from Last 3 Encounters:  07/27/20 (!) 143/79  06/10/20 (!) 145/79  04/29/20 (!) 140/92   ADHD- Reports that his focus is good on this medication.    R ear pain- Started to hurt on Saturday.  Monday had popping in her ear.  He has a tube in the right ear. Pain is near the edge of the canal.    Review of Systems See HPI  Past Medical History:  Diagnosis Date  . Acute calculous cholecystitis 08/13/2011  . Elevated blood pressure   . Nephrotic syndrome 1989     Social History   Socioeconomic History  . Marital status: Married    Spouse name: Not on file  . Number of children: Not on file  . Years of education: Not on file  . Highest education level: Not on file  Occupational History  . Not on file  Tobacco Use  . Smoking status: Never Smoker  . Smokeless tobacco: Never Used  Substance and Sexual Activity  . Alcohol use: No  . Drug use: No  . Sexual activity: Not on file  Other Topics Concern  . 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 CHOLECYSTECTOMY  08/13/2011   Procedure: LAPAROSCOPIC CHOLECYSTECTOMY WITH INTRAOPERATIVE CHOLANGIOGRAM;   Surgeon: Ship broker, DO;  Location: [facility] OR;  Service: General;  Laterality: N/A;     . TONSILLECTOMY  2001    Family History  Problem Relation Age of Onset  . COPD Father   . Diabetes Father   . Heart disease Father 100  . Stroke Father   . Diabetes Maternal Grandfather   . Diabetes Paternal Grandfather     No Known Allergies  Current Outpatient Medications on File Prior to Visit  Medication Sig Dispense Refill  . amLODipine (NORVASC) 10 MG tablet Take 1 tablet (10 mg total) by mouth daily. 90 tablet 0  . escitalopram (LEXAPRO) 10 MG tablet Take 1 tablet (10 mg total) by mouth daily. 90 tablet 1  . testosterone cypionate (DEPOTESTOSTERONE CYPIONATE) 200 MG/ML injection Inject 200 mg into the muscle every 14 (fourteen) days.     No current facility-administered medications on file prior to visit.    BP (!) 143/79 (BP Location: Right Arm, Patient Position: Sitting, Cuff Size: Large)   Pulse 71   Temp 98.4 F (36.9 C) (Oral)   Resp 16   Ht 6\' 1"  (1.854 m)  Wt (!) 306 lb (138.8 kg)   SpO2 99%   BMI 40.37 kg/m       Objective:   Physical Exam Constitutional:      General: He is not in acute distress.    Appearance: He is well-developed and well-nourished.  HENT:     Head: Normocephalic and atraumatic.  Cardiovascular:     Rate and Rhythm: Normal rate and regular rhythm.     Heart sounds: No murmur heard.   Pulmonary:     Effort: Pulmonary effort is normal. No respiratory distress.     Breath sounds: Normal breath sounds. No wheezing or rales.  Musculoskeletal:        General: No edema.  Skin:    General: Skin is warm and dry.  Neurological:     Mental Status: He is alert and oriented to person, place, and time.  Psychiatric:        Mood and Affect: Mood and affect normal.        Behavior: Behavior normal.        Thought Content: Thought content normal.           Assessment & Plan:  HTN-  bp uncontrolled.  Advised the patient not to increase  amlodipine to 15 mg.  We will continue amlodipine at 10 mg once daily, and have also added lisinopril 10 mg once daily to his regimen.  ADHD-he reports that the Vyvanse is helping him in a similar way as the other stimulants that he has used in the past.  However we will adjust the dose due to his binge eating disorder see below  Binge eating disorder-he is not noting as much improvement in his binging after initiation of the 30 mg dose.  Will increase to 50 mg once daily with close monitoring of his blood pressure and plan to bring him back in 1 month for follow-up.  This visit occurred during the SARS-CoV-2 public health emergency.  Safety protocols were in place, including screening questions prior to the visit, additional usage of staff PPE, and extensive cleaning of exam room while observing appropriate contact time as indicated for disinfecting solutions.

## 2020-07-28 ENCOUNTER — Encounter: Payer: Self-pay | Admitting: Family

## 2020-07-29 ENCOUNTER — Ambulatory Visit (HOSPITAL_COMMUNITY)
Admission: RE | Admit: 2020-07-29 | Discharge: 2020-07-29 | Disposition: A | Discharge status: Home / Self Care | Payer: BC Managed Care – PPO | Source: Ambulatory Visit | Attending: Surgery | Admitting: Surgery

## 2020-07-29 ENCOUNTER — Other Ambulatory Visit: Payer: Self-pay

## 2020-07-29 DIAGNOSIS — Z01818 Encounter for other preprocedural examination: Secondary | ICD-10-CM | POA: Diagnosis not present

## 2020-08-05 DIAGNOSIS — F509 Eating disorder, unspecified: Secondary | ICD-10-CM | POA: Diagnosis not present

## 2020-08-08 ENCOUNTER — Other Ambulatory Visit: Payer: Self-pay

## 2020-08-08 ENCOUNTER — Encounter: Payer: Self-pay | Admitting: Family

## 2020-08-08 ENCOUNTER — Ambulatory Visit: Payer: BC Managed Care – PPO | Admitting: Family

## 2020-08-08 VITALS — BP 137/75 | HR 51 | Temp 98.1°F | Resp 18 | Wt 300.8 lb

## 2020-08-08 DIAGNOSIS — F5081 Binge eating disorder: Secondary | ICD-10-CM

## 2020-08-08 DIAGNOSIS — I10 Essential (primary) hypertension: Secondary | ICD-10-CM | POA: Diagnosis not present

## 2020-08-08 DIAGNOSIS — F909 Attention-deficit hyperactivity disorder, unspecified type: Secondary | ICD-10-CM

## 2020-08-08 LAB — BASIC METABOLIC PANEL
BUN: 10 mg/dL (ref 6–23)
CO2: 28 mEq/L (ref 19–32)
Calcium: 9.4 mg/dL (ref 8.4–10.5)
Chloride: 103 mEq/L (ref 96–112)
Creatinine, Ser: 0.83 mg/dL (ref 0.40–1.50)
GFR: 114.13 mL/min (ref >60.00)
Glucose, Bld: 85 mg/dL (ref 70–99)
Potassium: 4.7 mEq/L (ref 3.5–5.1)
Sodium: 136 mEq/L (ref 135–145)

## 2020-08-08 MED ORDER — AMLODIPINE BESYLATE 10 MG PO TABS: 10.0000 mg | ORAL_TABLET | Freq: Every day | ORAL | 1 refills | Status: DC

## 2020-08-08 NOTE — Progress Notes (Signed)
Subjective:    Patient ID: John Faulkner, male    DOB: 12/23/1985, 35 y.o.   MRN: [id number]  HPI  Patient is a 35 yr old male who presents today for follow up.  HTN- last visit we added lisinopril 10mg  and had him continue amlodipine at 10mg .  Tolerating lisinopril without side effect.  BP Readings from Last 3 Encounters:  08/08/20 137/75  07/27/20 (!) 143/79  06/10/20 (!) 145/79   Binge Eating disorder-  Last visit we increased his vyvanse from 30mg  to 50mg . Reports that he has not had any binges since his last visit.  Wt Readings from Last 3 Encounters:  08/08/20 (!) 300 lb 12.8 oz (136.4 kg)  07/27/20 (!) 306 lb (138.8 kg)  06/10/20 (!) 301 lb (136.5 kg)   ADHD- pt notes that the vyvanse does "not wear off" at the 50mg  dose like it did on the 30mg  dose.  He also tells me that his mother is currently hospitalized in the ICU with COVID. She is unvaccinated.  He plans to visit her today on his day off.   Review of Systems    see HPI  Past Medical History:  Diagnosis Date  . Acute calculous cholecystitis 08/13/2011  . Elevated blood pressure   . Nephrotic syndrome 1989     Social History   Socioeconomic History  . Marital status: Married    Spouse name: Not on file  . Number of children: Not on file  . Years of education: Not on file  . Highest education level: Not on file  Occupational History  . Not on file  Tobacco Use  . Smoking status: Never Smoker  . Smokeless tobacco: Never Used  Substance and Sexual Activity  . Alcohol use: No  . Drug use: No  . Sexual activity: Not on file  Other Topics Concern  . Not on file  Social History Narrative   Work or School:       Home Situation: lives with wife, 2 children   Daughter 2014   Son 2018      Has a 06/12/20 a      Spiritual Beliefs: none      Lifestyle: no regular exercise; poor      Works for a - runs a truck route      Social Determinants of 08/15/2011 Strain: Not on 2015 Insecurity: Not on file  Transportation Needs: Not on file  Physical Activity: Not on file  Stress: Not on file  Social Connections: Not on file  Intimate Partner Violence: Not on file    Past Surgical History:  Procedure Laterality Date  . CHOLECYSTECTOMY  08/13/2011   Procedure: LAPAROSCOPIC CHOLECYSTECTOMY WITH INTRAOPERATIVE CHOLANGIOGRAM;  Surgeon: Medical laboratory scientific officer, DO;  Location: [facility] OR;  Service: General;  Laterality: N/A;     . TONSILLECTOMY  2001    Family History  Problem Relation Age of Onset  . COPD Father   . Diabetes Father   . Heart disease Father 77  . Stroke Father   . Diabetes Maternal Grandfather   . Diabetes Paternal Grandfather     No Known Allergies  Current Outpatient Medications on File Prior to Visit  Medication Sig Dispense Refill  . amLODipine (NORVASC) 10 MG tablet Take 1 tablet (10 mg total) by mouth daily. 90 tablet 0  . escitalopram (LEXAPRO) 10 MG tablet Take 1 tablet (10 mg total) by mouth daily. 90 tablet 1  .  lisdexamfetamine (VYVANSE) 50 MG capsule Take 1 capsule (50 mg total) by mouth daily. 30 capsule 0  . lisinopril (ZESTRIL) 10 MG tablet Take 1 tablet (10 mg total) by mouth daily. 90 tablet 0  . testosterone cypionate (DEPOTESTOSTERONE CYPIONATE) 200 MG/ML injection Inject 200 mg into the muscle every 14 (fourteen) days.     No current facility-administered medications on file prior to visit.    BP 137/75 (BP Location: Left Arm, Patient Position: Sitting, Cuff Size: Large)   Pulse (!) 51   Temp 98.1 F (36.7 C) (Oral)   Resp 18   Wt (!) 300 lb 12.8 oz (136.4 kg)   SpO2 100%   BMI 39.69 kg/m    Objective:   Physical Exam Constitutional:      General: He is not in acute distress.    Appearance: He is well-developed and well-nourished.  HENT:     Head: Normocephalic and atraumatic.  Cardiovascular:     Rate and Rhythm: Normal rate and regular rhythm.     Heart sounds: No murmur  heard.   Pulmonary:     Effort: Pulmonary effort is normal. No respiratory distress.     Breath sounds: Normal breath sounds. No wheezing or rales.  Musculoskeletal:        General: No edema.  Skin:    General: Skin is warm and dry.  Neurological:     Mental Status: He is alert and oriented to person, place, and time.  Psychiatric:        Mood and Affect: Mood and affect normal.        Behavior: Behavior normal.        Thought Content: Thought content normal.           Assessment & Plan:  HTN- bp at goal. Continue amlodipine 10mg  and lisinopril 10mg . Obtain follow up bmet.  ADHD- notes improved duration of focus on the 50mg  dose of vyvanse which he is pleased with.  Binge Eating disorder- he has lost 6 pounds since his last visit. He states that he has not had any binges since we increased the vyvanse from 30mg  to 50mg .  Continue current dose.  This visit occurred during the SARS-CoV-2 public health emergency.  Safety protocols were in place, including screening questions prior to the visit, additional usage of staff PPE, and extensive cleaning of exam room while observing appropriate contact time as indicated for disinfecting solutions.

## 2020-08-11 ENCOUNTER — Other Ambulatory Visit: Payer: Self-pay

## 2020-08-11 ENCOUNTER — Encounter: Payer: BC Managed Care – PPO | Attending: Surgery | Admitting: Skilled Nursing Facility1

## 2020-08-11 ENCOUNTER — Encounter: Payer: Self-pay | Admitting: Skilled Nursing Facility1

## 2020-08-11 DIAGNOSIS — E669 Obesity, unspecified: Secondary | ICD-10-CM | POA: Insufficient documentation

## 2020-08-11 NOTE — Progress Notes (Signed)
Nutrition Assessment for Bariatric Surgery Medical Nutrition Therapy Appt Start Time: 9:58 End Time: 11:00  Patient was seen on 08/11/2020 for Pre-Operative Nutrition Assessment. Letter of approval faxed to [facility] bariatric surgery program coordinator on 08/11/2020.   Referral stated Supervised Weight Loss (SWL) visits needed: 0  Pt has completed his visits   Planned surgery: sleeve gastrectomy  Pt expectation of surgery: to lose weight Pt expectation of dietitian: to educate    NUTRITION ASSESSMENT   Anthropometrics  Start weight at [facility]: 304.3 lbs (date: 08/11/2020)  Height: 73 in BMI: 40.16 kg/m2     Clinical  Medical hx: HTN Medications: see list  Labs:  Notable signs/symptoms: N/A Any previous deficiencies? 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 Pt states he has a lock on the pantry for himself and his children: Dietitian advised to get rid of this lock as working on the relationship with foods is going to result in more success for him and his children rather than locking up certain foods.   Pt discussed his relationship with food and understands that is where he will glean success.   24-Hr Dietary Recall First Meal: protein shake Snack: Second Meal: skipped or chicken salad wrap bought out Snack:  Third Meal: eating out or sausage and mashed potato sand green beans Snack: cheese its or cereal or chips Beverages: gatorade zero, water, soda   Estimated Energy Needs Calories: 1800   NUTRITION DIAGNOSIS  Overweight/obesity (Rowe-3.3) related to past poor dietary habits and physical inactivity as evidenced by patient w/ planned sleeve gastrectomy surgery following dietary guidelines for  continued weight loss.    NUTRITION INTERVENTION  Nutrition counseling (C-1) and education (E-2) to facilitate bariatric surgery goals.   Pre-Op Goals Reviewed with the Patient . Track food and beverage intake (pen and paper, MyFitness Pal, Baritastic app, etc.) . Make healthy food choices while monitoring portion sizes . Consume 3 meals per day or try to eat every 3-5 hours . Avoid concentrated sugars and fried foods . Keep sugar & fat in the single digits per serving on food labels . Practice CHEWING your food (aim for applesauce consistency) . Practice not drinking 15 minutes before, during, and 30 minutes after each meal and snack . Avoid all carbonated beverages (ex: soda, sparkling beverages)  . Limit caffeinated beverages (ex: coffee, tea, energy drinks) . Avoid all sugar-sweetened beverages (ex: regular soda, sports drinks)  . Avoid alcohol  . Aim for 64-100 ounces of FLUID daily (with at least half of fluid intake being plain water)  . Aim for at least 60-80 grams of PROTEIN daily . Look for a liquid protein source that contains ?15 g protein and ?5 g carbohydrate (ex: shakes, drinks, shots) . Make a list of non-food related activities . Physical activity is an important part of a healthy lifestyle so keep it moving! The goal is to reach 150 minutes of exercise per week, including cardiovascular and weight baring activity.  *Goals that are bolded indicate the pt would like to start working towards these  Handouts Provided Include  . Bariatric Surgery handouts (Nutrition Visits, Pre-Op Goals, Protein Shakes, Vitamins & Minerals)  Learning Style & Readiness for Change Teaching method utilized: Visual & Auditory  Demonstrated degree of  understanding via: Teach Back  Readiness Level: Contemplative  Barriers to learning/adherence to lifestyle change: emotional eating   RD's Notes for Next Visit . Assess pts adherence to chosen goals      MONITORING & EVALUATION Dietary  intake, weekly physical activity, body weight, and pre-op goals reached at next nutrition visit.    Next Steps  Patient is to follow up at [facility] for Pre-Op Class >2 weeks before surgery for further nutrition education.

## 2020-08-15 ENCOUNTER — Ambulatory Visit (HOSPITAL_BASED_OUTPATIENT_CLINIC_OR_DEPARTMENT_OTHER): Payer: BC Managed Care – PPO | Attending: Surgery | Admitting: Internal Medicine

## 2020-08-15 ENCOUNTER — Other Ambulatory Visit: Payer: Self-pay

## 2020-08-15 VITALS — Ht 73.0 in | Wt 295.0 lb

## 2020-08-15 DIAGNOSIS — G4733 Obstructive sleep apnea (adult) (pediatric): Secondary | ICD-10-CM | POA: Diagnosis not present

## 2020-08-20 NOTE — Procedures (Signed)
    Patient Name: John Faulkner, John Faulkner Date: 08/16/2020 Gender: Male D.O.B: 10/09/1985 Age (years): 34 Referring Provider: Christia Reading Height (inches): 73 Interpreting Physician: Jetty Duhamel MD, ABSM Weight (lbs): 295 RPSGT: [place] BMI: 39 MRN: [id number] Neck Size: 18.50  CLINICAL INFORMATION Sleep Study Type: HST Indication for sleep study: Morbid Obesity Epworth Sleepiness Score: 7  SLEEP STUDY TECHNIQUE A multi-channel overnight portable sleep study was performed. The channels recorded were: nasal airflow, thoracic respiratory movement, and oxygen saturation with a pulse oximetry. Snoring was also monitored.  MEDICATIONS Patient self administered medications include: none reported  SLEEP ARCHITECTURE Patient was studied for 406.1 minutes. The sleep efficiency was 100.0 % and the patient was supine for 98.8%. The arousal index was 0.0 per hour.  RESPIRATORY PARAMETERS The overall AHI was 22.9 per hour, with a central apnea index of 0.0 per hour. The oxygen nadir was 81% during sleep.  CARDIAC DATA Mean heart rate during sleep was 59.7 bpm.  IMPRESSIONS - Moderate obstructive sleep apnea occurred during this study (AHI = 22.9/h). - No significant central sleep apnea occurred during this study (CAI = 0.0/h). - Moderate oxygen desaturation was noted during this study (Min O2 = 81%). Mean O2 saturation 94%. - Patient snored.  DIAGNOSIS - Obstructive Sleep Apnea (G47.33)  RECOMMENDATIONS - Suggest CPAP titiration sleep study or autopap. Other options would be based on clinical judgment. - Be careful with alcohol, sedatives and other CNS depressants that may worsen sleep apnea and disrupt normal sleep architecture. - Sleep hygiene should be reviewed to assess factors that may improve sleep quality. - Weight management and regular exercise should be initiated or continued.  [Electronically signed] 08/20/2020 11:56 AM  Jetty Duhamel MD, ABSM Diplomate,  American Board of Sleep Medicine   NPI: [id number]                         Jetty Duhamel Diplomate, American Board of Sleep Medicine  ELECTRONICALLY SIGNED ON:  08/20/2020, 11:54 AM [facility] PH: [phone]   FX: [phone] ACCREDITED BY THE AMERICAN ACADEMY OF SLEEP MEDICINE

## 2020-08-22 DIAGNOSIS — F909 Attention-deficit hyperactivity disorder, unspecified type: Secondary | ICD-10-CM | POA: Diagnosis not present

## 2020-08-24 DIAGNOSIS — E291 Testicular hypofunction: Secondary | ICD-10-CM | POA: Diagnosis not present

## 2020-08-25 ENCOUNTER — Encounter: Payer: Self-pay | Admitting: Family

## 2020-08-25 MED ORDER — LISDEXAMFETAMINE DIMESYLATE 50 MG PO CAPS: 50.0000 mg | ORAL_CAPSULE | Freq: Every day | ORAL | 0 refills | Status: DC

## 2020-08-25 NOTE — Telephone Encounter (Signed)
Requesting: Vyvanse 50mg  Contract: 04/29/2020 UDS: 04/29/2020 Last Visit: 08/08/2020 Next Visit: 11/07/2020 Last Refill: 07/27/2020 #30 AND 0rf  Please Advise

## 2020-08-31 DIAGNOSIS — F509 Eating disorder, unspecified: Secondary | ICD-10-CM | POA: Diagnosis not present

## 2020-09-07 DIAGNOSIS — E291 Testicular hypofunction: Secondary | ICD-10-CM | POA: Diagnosis not present

## 2020-09-10 ENCOUNTER — Ambulatory Visit: Payer: Self-pay | Admitting: Surgery

## 2020-09-10 NOTE — H&P (View-Only) (Signed)
Surgical Evaluation  Chief Complaint: Morbid Obesity  HPI: follow-up regarding surgical treatment of morbid obesity.  He reports no changes in his health since her last visit, however his mother did unfortunately passed away from covid pneumonia last month.  He has been dealing with this okay.  He has completed the bariatric pathway with no barriers identified and is currently scheduled for a sleeve gastrectomy on April 18. He is here today with his wife and has several insightful questions to discuss.  Upper GI/chest x-ray negative, no hiatal hernia Psych/ nutrition- approved (Dr. Ardath Sax, Sandie Ano) Labs reviewed- unremarkable  Initial visit 07/14/20: This is a very pleasant 35 year old man who presents for surgical consultation regarding management of morbid obesity. He has been struggling with this disease for over 20 years, essentially since he was in middle school.  In 2006-2007 he was very successful with losing weight through diet and exercise and was able to reach a weight of 185 pounds- a loss of about 124 pounds.  This was when he was 24 or 35 years old.  Over the course of time he regained weight up to a maximum of 350 pounds.  He has tried diet, including ketogenic and calorie restricted, and exercise multiple times since then with limited or no success.  He has been working hard at this for the last 2 or 3 years.  He has met with a dietitian for several months which was educational but did not yield any weight loss.  He enrolled with the [facility] supervised weight loss program in 2019 and through behavioral modification and phentermine, he was able to lose about 70 pounds in the course of 6 months of working with them.  Once he stopped, she has started to slowly regaining weight, and has regained about 20 pounds.  He began working with a Physiological scientist about 5 months ago and continues to workout for about 90 minutes 3 times a week. He notes that his current lifestyle is somewhat erratic,  his work days are long and occasionally he does skip breakfast or lunch, does not have specific mealtimes are really a lot of time to prepare meals ahead of time.  He does note that at times at the end of the day he has difficulty controlling his eating and will engage in some mindless over-eating. He endorses a good understanding of what he needs to do to lose weight and feels motivated to do it, but is struggling to make significant progress on his own.  He is interested in the sleeve gastrectomy in order to afford more effective weight loss in order to improve/prevent comorbidities and improve the quality of his life.  Medical history: Nephrotic syndrome (?) (Last BMP 04/29/20 was unremarkable with a creatinine of 0.73), hypertension, ADHD. Primary care provider is [name] Previous abdominal surgery includes laparoscopic cholecystectomy 2013  Family history notable for COPD, diabetes, heart disease and stroke in his father  Social history denies alcohol, drug or tobacco use.  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  No Known Allergies  Past Medical History:  Diagnosis Date   Acute calculous cholecystitis 08/13/2011   Elevated blood pressure    Nephrotic syndrome 1989    Past Surgical History:  Procedure Laterality Date   CHOLECYSTECTOMY  08/13/2011   Procedure: LAPAROSCOPIC CHOLECYSTECTOMY WITH INTRAOPERATIVE CHOLANGIOGRAM;  Surgeon:  Judieth Keens, DO;  Location: [facility] OR;  Service: General;  Laterality: N/A;      TONSILLECTOMY  2001    Family History  Problem Relation Age of Onset   COPD Father    Diabetes Father    Heart disease Father 54   Stroke Father    Diabetes Maternal Grandfather    Diabetes Paternal Grandfather     Social History   Socioeconomic History   Marital status:  Married    Spouse name: Not on file   Number of children: Not on file   Years of education: Not on file   Highest education level: Not on file  Occupational History   Not on file  Tobacco Use   Smoking status: Never Smoker   Smokeless tobacco: Never Used  Substance and Sexual Activity   Alcohol use: No   Drug use: No   Sexual activity: Not on file  Other Topics Concern   Not on file  Social History Narrative   Work or School:       Home Situation: lives with wife, 2 children   Daughter 2014   Son 2018      Has a cat a Magazine features editor      Spiritual Beliefs: none      Lifestyle: no regular exercise; poor      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 90 tablet 1   escitalopram (LEXAPRO) 10 MG tablet Take 1 tablet (10 mg total) by mouth daily. 90 tablet 1   lisdexamfetamine (VYVANSE) 50 MG capsule Take 1 capsule (50 mg total) by mouth daily. 30 capsule 0   lisinopril (ZESTRIL) 10 MG tablet Take 1 tablet (10 mg total) by mouth daily. 90 tablet 0   testosterone cypionate (DEPOTESTOSTERONE CYPIONATE) 200 MG/ML injection Inject 200 mg into the muscle every 14 (fourteen) days.     No current facility-administered medications on file prior to visit.    Review of Systems: a complete, 10pt review of systems was completed with pertinent positives and negatives as documented in the HPI  Physical Exam: Vitals  Weight: 305.25 lb   Height: 72.5 in  Body Surface Area: 2.56 m   Body Mass Index: 40.83 kg/m   Temp.: 98 F    Pulse: 112 (Regular)    P.OX: 98% (Room air) BP:  160/70(Sitting, Left Arm, Standard)  A&O x 3 unlabored respirations    CBC Latest Ref Rng & Units 12/25/2019 12/07/2019 08/14/2011  WBC 4.0 - 10.5 K/uL 7.4 6.7 9.2  Hemoglobin 13.0 - 17.0 g/dL 15.8 15.7 14.1  Hematocrit 39.0 - 52.0 % 46.9 45.9 42.1  Platelets 150.0 - 400.0 K/uL 178.0 166.0 207    CMP Latest Ref Rng & Units 08/08/2020 04/29/2020 12/25/2019  Glucose 70 - 99 mg/dL 85 91 -  BUN 6 - 23 mg/dL 10 9 -  Creatinine 0.40 - 1.50 mg/dL 0.83 0.73 -  Sodium 135 - 145 mEq/L 136 139 -  Potassium 3.5 - 5.1 mEq/L 4.7 4.6 -  Chloride 96 - 112  mEq/L 103 105 -  CO2 19 - 32 mEq/L 28 29 -  Calcium 8.4 - 10.5 mg/dL 9.4 9.2 -  Total Protein 6.0 - 8.3 g/dL - - 6.9  Total Bilirubin 0.2 - 1.2 mg/dL - - 0.5  Alkaline Phos 39 - 117 U/L - - 62  AST 0 - 37 U/L - - 16  ALT 0 - 53 U/L - - 17    No results found for: INR, PROTIME  Imaging: SLEEP STUDY DOCUMENTS  Result Date: 09/09/2020 Ordered by an unspecified provider.    A/P: OBESITY, MORBID, BMI 40.0-49.9 (E66.01) Story: He has had inadequate weight loss despite committed attempts at conservative medical therapy. He has completed the bariatric pathway and remains a good candidate for sleeve gastrectomy. We have previously discussed the surgery including technical aspects, the risks of bleeding, infection, pain, scarring, injury to intra-abdominal structures, staple line leak or abscess, chronic abdominal pain or nausea, new onset or worsened GERD, DVT/PE, pneumonia, heart attack, stroke, death, failure to reach weight loss goals and weight regain, hernia. Discussed the typical pre-, peri-, and postoperative course. Discussed the importance of lifelong behavioral changes to combat the chronic and relapsing disease which is obesity. Questions were welcomed and answered. We'll plan to proceed with surgery as scheduled on the 18th.   HYPERTENSION (I10) Story: Takes amlodipine   ADHD (F90.9) Story: takes vyvanse.   DEPRESSION (F32.A) Story:  lexapro    Patient Active Problem List   Diagnosis Date Noted   Class 2 obesity with body mass index (BMI) of 38.0 to 38.9 in adult 08/01/2018   ADHD (attention deficit hyperactivity disorder) 08/01/2018   Hypertension 08/13/2011       Romana Juniper, [facility]  See AMION to contact appropriate on-call provider

## 2020-09-10 NOTE — H&P (Signed)
Surgical Evaluation  Chief Complaint: Morbid Obesity  HPI: follow-up regarding surgical treatment of morbid obesity.  He reports no changes in his health since her last visit, however his mother did unfortunately passed away from covid pneumonia last month.  He has been dealing with this okay.  He has completed the bariatric pathway with no barriers identified and is currently scheduled for a sleeve gastrectomy on April 18. He is here today with his wife and has several insightful questions to discuss.  Upper GI/chest x-ray negative, no hiatal hernia Psych/ nutrition- approved (Dr. Ardath Sax, Sandie Ano) Labs reviewed- unremarkable  Initial visit 07/14/20: This is a very pleasant 35 year old man who presents for surgical consultation regarding management of morbid obesity. He has been struggling with this disease for over 20 years, essentially since he was in middle school.  In 2006-2007 he was very successful with losing weight through diet and exercise and was able to reach a weight of 185 pounds- a loss of about 124 pounds.  This was when he was 55 or 35 years old.  Over the course of time he regained weight up to a maximum of 350 pounds.  He has tried diet, including ketogenic and calorie restricted, and exercise multiple times since then with limited or no success.  He has been working hard at this for the last 2 or 3 years.  He has met with a dietitian for several months which was educational but did not yield any weight loss.  He enrolled with the [facility] supervised weight loss program in 2019 and through behavioral modification and phentermine, he was able to lose about 70 pounds in the course of 6 months of working with them.  Once he stopped, she has started to slowly regaining weight, and has regained about 20 pounds.  He began working with a Physiological scientist about 5 months ago and continues to workout for about 90 minutes 3 times a week. He notes that his current lifestyle is somewhat erratic,  his work days are long and occasionally he does skip breakfast or lunch, does not have specific mealtimes are really a lot of time to prepare meals ahead of time.  He does note that at times at the end of the day he has difficulty controlling his eating and will engage in some mindless over-eating. He endorses a good understanding of what he needs to do to lose weight and feels motivated to do it, but is struggling to make significant progress on his own.  He is interested in the sleeve gastrectomy in order to afford more effective weight loss in order to improve/prevent comorbidities and improve the quality of his life.  Medical history: Nephrotic syndrome (?) (Last BMP 04/29/20 was unremarkable with a creatinine of 0.73), hypertension, ADHD. Primary care provider is [name] Previous abdominal surgery includes laparoscopic cholecystectomy 2013  Family history notable for COPD, diabetes, heart disease and stroke in his father  Social history denies alcohol, drug or tobacco use.  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  No Known Allergies  Past Medical History:  Diagnosis Date   Acute calculous cholecystitis 08/13/2011   Elevated blood pressure    Nephrotic syndrome 1989    Past Surgical History:  Procedure Laterality Date   CHOLECYSTECTOMY  08/13/2011   Procedure: LAPAROSCOPIC CHOLECYSTECTOMY WITH INTRAOPERATIVE CHOLANGIOGRAM;  Surgeon:  Judieth Keens, DO;  Location: [facility] OR;  Service: General;  Laterality: N/A;      TONSILLECTOMY  2001    Family History  Problem Relation Age of Onset   COPD Father    Diabetes Father    Heart disease Father 83   Stroke Father    Diabetes Maternal Grandfather    Diabetes Paternal Grandfather     Social History   Socioeconomic History   Marital status:  Married    Spouse name: Not on file   Number of children: Not on file   Years of education: Not on file   Highest education level: Not on file  Occupational History   Not on file  Tobacco Use   Smoking status: Never Smoker   Smokeless tobacco: Never Used  Substance and Sexual Activity   Alcohol use: No   Drug use: No   Sexual activity: Not on file  Other Topics Concern   Not on file  Social History Narrative   Work or School:       Home Situation: lives with wife, 2 children   Daughter 2014   Son 2018      Has a cat a Magazine features editor      Spiritual Beliefs: none      Lifestyle: no regular exercise; poor      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 90 tablet 1   escitalopram (LEXAPRO) 10 MG tablet Take 1 tablet (10 mg total) by mouth daily. 90 tablet 1   lisdexamfetamine (VYVANSE) 50 MG capsule Take 1 capsule (50 mg total) by mouth daily. 30 capsule 0   lisinopril (ZESTRIL) 10 MG tablet Take 1 tablet (10 mg total) by mouth daily. 90 tablet 0   testosterone cypionate (DEPOTESTOSTERONE CYPIONATE) 200 MG/ML injection Inject 200 mg into the muscle every 14 (fourteen) days.     No current facility-administered medications on file prior to visit.    Review of Systems: a complete, 10pt review of systems was completed with pertinent positives and negatives as documented in the HPI  Physical Exam: Vitals  Weight: 305.25 lb   Height: 72.5 in  Body Surface Area: 2.56 m   Body Mass Index: 40.83 kg/m   Temp.: 98 F    Pulse: 112 (Regular)    P.OX: 98% (Room air) BP:  160/70(Sitting, Left Arm, Standard)  A&O x 3 unlabored respirations    CBC Latest Ref Rng & Units 12/25/2019 12/07/2019 08/14/2011  WBC 4.0 - 10.5 K/uL 7.4 6.7 9.2  Hemoglobin 13.0 - 17.0 g/dL 15.8 15.7 14.1  Hematocrit 39.0 - 52.0 % 46.9 45.9 42.1  Platelets 150.0 - 400.0 K/uL 178.0 166.0 207    CMP Latest Ref Rng & Units 08/08/2020 04/29/2020 12/25/2019  Glucose 70 - 99 mg/dL 85 91 -  BUN 6 - 23 mg/dL 10 9 -  Creatinine 0.40 - 1.50 mg/dL 0.83 0.73 -  Sodium 135 - 145 mEq/L 136 139 -  Potassium 3.5 - 5.1 mEq/L 4.7 4.6 -  Chloride 96 - 112  mEq/L 103 105 -  CO2 19 - 32 mEq/L 28 29 -  Calcium 8.4 - 10.5 mg/dL 9.4 9.2 -  Total Protein 6.0 - 8.3 g/dL - - 6.9  Total Bilirubin 0.2 - 1.2 mg/dL - - 0.5  Alkaline Phos 39 - 117 U/L - - 62  AST 0 - 37 U/L - - 16  ALT 0 - 53 U/L - - 17    No results found for: INR, PROTIME  Imaging: SLEEP STUDY DOCUMENTS  Result Date: 09/09/2020 Ordered by an unspecified provider.    A/P: OBESITY, MORBID, BMI 40.0-49.9 (E66.01) Story: He has had inadequate weight loss despite committed attempts at conservative medical therapy. He has completed the bariatric pathway and remains a good candidate for sleeve gastrectomy. We have previously discussed the surgery including technical aspects, the risks of bleeding, infection, pain, scarring, injury to intra-abdominal structures, staple line leak or abscess, chronic abdominal pain or nausea, new onset or worsened GERD, DVT/PE, pneumonia, heart attack, stroke, death, failure to reach weight loss goals and weight regain, hernia. Discussed the typical pre-, peri-, and postoperative course. Discussed the importance of lifelong behavioral changes to combat the chronic and relapsing disease which is obesity. Questions were welcomed and answered. We'll plan to proceed with surgery as scheduled on the 18th.   HYPERTENSION (I10) Story: Takes amlodipine   ADHD (F90.9) Story: takes vyvanse.   DEPRESSION (F32.A) Story:  lexapro    Patient Active Problem List   Diagnosis Date Noted   Class 2 obesity with body mass index (BMI) of 38.0 to 38.9 in adult 08/01/2018   ADHD (attention deficit hyperactivity disorder) 08/01/2018   Hypertension 08/13/2011       Romana Juniper, [facility]  See AMION to contact appropriate on-call provider

## 2020-09-12 ENCOUNTER — Encounter: Payer: Self-pay | Admitting: Family

## 2020-09-19 ENCOUNTER — Encounter: Payer: BC Managed Care – PPO | Attending: Surgery | Admitting: Skilled Nursing Facility1

## 2020-09-19 ENCOUNTER — Other Ambulatory Visit: Payer: Self-pay

## 2020-09-19 DIAGNOSIS — Z6838 Body mass index (BMI) 38.0-38.9, adult: Secondary | ICD-10-CM | POA: Diagnosis not present

## 2020-09-19 NOTE — Progress Notes (Signed)
Pre-Operative Nutrition Class:  Appt start time: 3225   End time:  1830.  Patient was seen on 09/19/2020 for Pre-Operative Bariatric Surgery Education at the [facility].    Surgery date: 10/10/2020 Surgery type: sleeve Start weight at [facility]: 304.3 Weight today: 315.3   The following the learning objectives were met by the patient during this course:  Identify Pre-Op Dietary Goals and will begin 2 weeks pre-operatively  Identify appropriate sources of fluids and proteins   State protein recommendations and appropriate sources pre and post-operatively  Identify Post-Operative Dietary Goals and will follow for 2 weeks post-operatively  Identify appropriate multivitamin and calcium sources  Describe the need for physical activity post-operatively and will follow MD recommendations  State when to call healthcare provider regarding medication questions or post-operative complications  Handouts given during class include:  Pre-Op Bariatric Surgery Diet Handout  Protein Shake Handout  Post-Op Bariatric Surgery Nutrition Handout  BELT Program Information Flyer  Support Group Information Flyer  [facility] Bariatric Supplements Price List  Follow-Up Plan: Patient will follow-up at [facility] 2 weeks post operatively for diet advancement per MD.

## 2020-09-22 DIAGNOSIS — E291 Testicular hypofunction: Secondary | ICD-10-CM | POA: Diagnosis not present

## 2020-09-26 MED ORDER — LISDEXAMFETAMINE DIMESYLATE 50 MG PO CAPS: 50.0000 mg | ORAL_CAPSULE | Freq: Every day | ORAL | 0 refills | Status: DC

## 2020-09-26 NOTE — Progress Notes (Signed)
DUE TO COVID-19 ONLY ONE VISITOR IS ALLOWED TO COME WITH YOU AND STAY IN THE WAITING ROOM ONLY DURING PRE OP AND PROCEDURE DAY OF SURGERY. THE 1 VISITOR  MAY VISIT WITH YOU AFTER SURGERY IN YOUR PRIVATE ROOM DURING VISITING HOURS ONLY!  YOU NEED TO HAVE A COVID 19 TEST ON___4/14/2022 ____ @_______ , THIS TEST MUST BE DONE BEFORE SURGERY,  COVID TESTING SITE [address] [place] , IT IS ON THE RIGHT GOING OUT [address] APPROXIMATELY  2 MINUTES PAST [employer] ON THE RIGHT. ONCE YOUR COVID TEST IS COMPLETED,  PLEASE BEGIN THE QUARANTINE INSTRUCTIONS AS OUTLINED IN YOUR HANDOUT.                John Faulkner  09/26/2020   Your procedure is scheduled on:  10/10/2020   Report to [facility] Main  Entrance   Report to admitting at     0515 AM     Call this number if you have problems the morning of surgery [phone]    REMEMBER: NO  SOLID FOOD CANDY OR GUM AFTER MIDNIGHT. CLEAR LIQUIDS UNTIL   0430am       . NOTHING BY MOUTH EXCEPT CLEAR LIQUIDS UNTIL      0430am   . PLEASE FINISH ENSURE DRINK PER SURGEON ORDER  WHICH NEEDS TO BE COMPLETED AT  0430am     .      CLEAR LIQUID DIET   Foods Allowed                                                                    Coffee and tea, regular and decaf                            Fruit ices (not with fruit pulp)                                      Iced Popsicles                                    Carbonated beverages, regular and diet                                    Cranberry, grape and apple juices Sports drinks like Gatorade Lightly seasoned clear broth or consume(fat free) Sugar, honey syrup ___________________________________________________________________      BRUSH YOUR TEETH MORNING OF SURGERY AND RINSE YOUR MOUTH OUT, NO CHEWING GUM CANDY OR MINTS.     Take these medicines the morning of surgery with A SIP OF WATER:      Vyvanse, lexapro, zyrtec, amlodipine  DO NOT TAKE ANY  DIABETIC MEDICATIONS DAY OF YOUR SURGERY                               You may not have any metal on your body including hair pins and  piercings  Do not wear jewelry, make-up, lotions, powders or perfumes, deodorant             Do not wear nail polish on your fingernails.  Do not shave  48 hours prior to surgery.              Men may shave face and neck.   Do not bring valuables to the hospital. [place].  Contacts, dentures or bridgework may not be worn into surgery.  Leave suitcase in the car. After surgery it may be brought to your room.     Patients discharged the day of surgery will not be allowed to drive home. IF YOU ARE HAVING SURGERY AND GOING HOME THE SAME DAY, YOU MUST HAVE AN ADULT TO DRIVE YOU HOME AND BE WITH YOU FOR 24 HOURS. YOU MAY GO HOME BY TAXI OR UBER OR ORTHERWISE, BUT AN ADULT MUST ACCOMPANY YOU HOME AND STAY WITH YOU FOR 24 HOURS.  Name and phone number of your driver:  Special Instructions: N/A              Please read over the following fact sheets you were given: _____________________________________________________________________  [facility] - Preparing for Surgery Before surgery, you can play an important role.  Because skin is not sterile, your skin needs to be as free of germs as possible.  You can reduce the number of germs on your skin by washing with CHG (chlorahexidine gluconate) soap before surgery.  CHG is an antiseptic cleaner which kills germs and bonds with the skin to continue killing germs even after washing. Please DO NOT use if you have an allergy to CHG or antibacterial soaps.  If your skin becomes reddened/irritated stop using the CHG and inform your nurse when you arrive at Short Stay. Do not shave (including legs and underarms) for at least 48 hours prior to the first CHG shower.  You may shave your face/neck. Please follow these instructions carefully:  1.  Shower with CHG Soap  the night before surgery and the  morning of Surgery.  2.  If you choose to wash your hair, wash your hair first as usual with your  normal  shampoo.  3.  After you shampoo, rinse your hair and body thoroughly to remove the  shampoo.                           4.  Use CHG as you would any other liquid soap.  You can apply chg directly  to the skin and wash                       Gently with a scrungie or clean washcloth.  5.  Apply the CHG Soap to your body ONLY FROM THE NECK DOWN.   Do not use on face/ open                           Wound or open sores. Avoid contact with eyes, ears mouth and genitals (private parts).                       Wash face,  Genitals (private parts) with your normal soap.             6.  Wash thoroughly, paying special attention to the area where your surgery  will be performed.  7.  Thoroughly rinse your body with warm water from the neck down.  8.  DO NOT shower/wash with your normal soap after using and rinsing off  the CHG Soap.                9.  Pat yourself dry with a clean towel.            10.  Wear clean pajamas.            11.  Place clean sheets on your bed the night of your first shower and do not  sleep with pets. Day of Surgery : Do not apply any lotions/deodorants the morning of surgery.  Please wear clean clothes to the hospital/surgery center.  FAILURE TO FOLLOW THESE INSTRUCTIONS MAY RESULT IN THE CANCELLATION OF YOUR SURGERY PATIENT SIGNATURE_________________________________  NURSE SIGNATURE__________________________________  ________________________________________________________________________

## 2020-09-26 NOTE — Addendum Note (Signed)
Addended by: Sandford Craze on: 09/26/2020 01:23 PM   Modules accepted: Orders

## 2020-09-28 DIAGNOSIS — I10 Essential (primary) hypertension: Secondary | ICD-10-CM | POA: Diagnosis not present

## 2020-09-28 DIAGNOSIS — F32A Depression, unspecified: Secondary | ICD-10-CM | POA: Diagnosis not present

## 2020-09-28 DIAGNOSIS — F909 Attention-deficit hyperactivity disorder, unspecified type: Secondary | ICD-10-CM | POA: Diagnosis not present

## 2020-09-29 ENCOUNTER — Other Ambulatory Visit (HOSPITAL_COMMUNITY): Payer: Self-pay

## 2020-10-03 ENCOUNTER — Encounter (HOSPITAL_COMMUNITY)
Admission: RE | Admit: 2020-10-03 | Discharge: 2020-10-03 | Disposition: A | Discharge status: Home / Self Care | Payer: BC Managed Care – PPO | Source: Ambulatory Visit | Attending: Surgery | Admitting: Surgery

## 2020-10-03 ENCOUNTER — Other Ambulatory Visit: Payer: Self-pay

## 2020-10-03 ENCOUNTER — Encounter (HOSPITAL_COMMUNITY): Payer: Self-pay

## 2020-10-03 DIAGNOSIS — Z01812 Encounter for preprocedural laboratory examination: Secondary | ICD-10-CM | POA: Insufficient documentation

## 2020-10-03 HISTORY — DX: Sleep apnea, unspecified: G47.30

## 2020-10-03 HISTORY — DX: Anxiety disorder, unspecified: F41.9

## 2020-10-03 HISTORY — DX: Depression, unspecified: F32.A

## 2020-10-03 HISTORY — DX: Essential (primary) hypertension: I10

## 2020-10-03 LAB — CBC WITH DIFFERENTIAL/PLATELET
Abs Immature Granulocytes: 0.01 10*3/uL (ref 0.00–0.07)
Basophils Absolute: 0.1 10*3/uL (ref 0.0–0.1)
Basophils Relative: 1 %
Eosinophils Absolute: 0.1 10*3/uL (ref 0.0–0.5)
Eosinophils Relative: 2 %
HCT: 47.6 % (ref 39.0–52.0)
Hemoglobin: 15.9 g/dL (ref 13.0–17.0)
Immature Granulocytes: 0 %
Lymphocytes Relative: 30 %
Lymphs Abs: 1.9 10*3/uL (ref 0.7–4.0)
MCH: 30.9 pg (ref 26.0–34.0)
MCHC: 33.4 g/dL (ref 30.0–36.0)
MCV: 92.6 fL (ref 80.0–100.0)
Monocytes Absolute: 0.7 10*3/uL (ref 0.1–1.0)
Monocytes Relative: 10 %
Neutro Abs: 3.5 10*3/uL (ref 1.7–7.7)
Neutrophils Relative %: 57 %
Platelets: 211 10*3/uL (ref 150–400)
RBC: 5.14 MIL/uL (ref 4.22–5.81)
RDW: 12.6 % (ref 11.5–15.5)
WBC: 6.3 10*3/uL (ref 4.0–10.5)
nRBC: 0 % (ref 0.0–0.2)

## 2020-10-03 LAB — COMPREHENSIVE METABOLIC PANEL
ALT: 35 U/L (ref 0–44)
AST: 29 U/L (ref 15–41)
Albumin: 4.3 g/dL (ref 3.5–5.0)
Alkaline Phosphatase: 60 U/L (ref 38–126)
Anion gap: 5 (ref 5–15)
BUN: 15 mg/dL (ref 6–20)
CO2: 24 mmol/L (ref 22–32)
Calcium: 9.1 mg/dL (ref 8.9–10.3)
Chloride: 104 mmol/L (ref 98–111)
Creatinine, Ser: 0.85 mg/dL (ref 0.61–1.24)
GFR, Estimated: >60 mL/min (ref >60)
Glucose, Bld: 88 mg/dL (ref 70–99)
Potassium: 4.3 mmol/L (ref 3.5–5.1)
Sodium: 133 mmol/L — ABNORMAL LOW (ref 135–145)
Total Bilirubin: 0.6 mg/dL (ref 0.3–1.2)
Total Protein: 7.1 g/dL (ref 6.5–8.1)

## 2020-10-03 NOTE — Progress Notes (Addendum)
       Anesthesia Review:  PCP: Tempie Donning, NP 08/08/20- LOV  Cardiologist : Pull[place]- DR C Young Sleep Study 08/15/20  Chest x-ray : 07/29/20  EKG :07/29/20 Echo : Stress test: Cardiac Cath :  Activity level: can do a flight of stairs without difficulty  Sleep Study/ CPAP : sleep apnea, no cpap  Fasting Blood Sugar :      / Checks Blood Sugar -- times a day:   Blood Thinner/ Instructions /Last Dose: ASA / Instructions/ Last Dose :

## 2020-10-06 ENCOUNTER — Other Ambulatory Visit (HOSPITAL_COMMUNITY)
Admission: RE | Admit: 2020-10-06 | Discharge: 2020-10-06 | Disposition: A | Discharge status: Home / Self Care | Payer: BC Managed Care – PPO | Source: Ambulatory Visit | Attending: Surgery | Admitting: Surgery

## 2020-10-06 DIAGNOSIS — Z20822 Contact with and (suspected) exposure to covid-19: Secondary | ICD-10-CM | POA: Insufficient documentation

## 2020-10-06 DIAGNOSIS — Z01812 Encounter for preprocedural laboratory examination: Secondary | ICD-10-CM | POA: Insufficient documentation

## 2020-10-06 DIAGNOSIS — E291 Testicular hypofunction: Secondary | ICD-10-CM | POA: Diagnosis not present

## 2020-10-06 LAB — SARS CORONAVIRUS 2 (TAT 6-24 HRS): SARS Coronavirus 2: NEGATIVE

## 2020-10-09 MED ORDER — BUPIVACAINE LIPOSOME 1.3 % IJ SUSP
20.0000 mL | INTRAMUSCULAR | Status: DC
  Filled 2020-10-09: qty 20

## 2020-10-09 NOTE — Anesthesia Preprocedure Evaluation (Addendum)
Anesthesia Evaluation  Patient identified by MRN, date of birth, ID band Patient awake    Reviewed: Allergy & Precautions, NPO status , Patient's Chart, lab work & pertinent test results  History of Anesthesia Complications Negative for: history of anesthetic complications  Airway Mallampati: II  TM Distance: >3 FB Neck ROM: Full    Dental  (+) Dental Advisory Given   Pulmonary sleep apnea (does not use CPAP) ,  10/06/2020 SARS coronavirus NEG   Pulmonary exam normal        Cardiovascular hypertension, Pt. on medications  Rhythm:Regular Rate:Normal     Neuro/Psych Anxiety Depression negative neurological ROS     GI/Hepatic negative GI ROS, Neg liver ROS,   Endo/Other  Morbid obesity  Renal/GU negative Renal ROS     Musculoskeletal   Abdominal (+) + obese,   Peds  Hematology   Anesthesia Other Findings   Reproductive/Obstetrics                            Anesthesia Physical Anesthesia Plan  ASA: III  Anesthesia Plan: General   Post-op Pain Management:    Induction: Intravenous  PONV Risk Score and Plan: 2 and Ondansetron and Dexamethasone  Airway Management Planned: Oral ETT  Additional Equipment: None  Intra-op Plan:   Post-operative Plan: Extubation in OR  Informed Consent: I have reviewed the patients History and Physical, chart, labs and discussed the procedure including the risks, benefits and alternatives for the proposed anesthesia with the patient or authorized representative who has indicated his/her understanding and acceptance.     Dental advisory given  Plan Discussed with: CRNA and Surgeon  Anesthesia Plan Comments:        Anesthesia Quick Evaluation

## 2020-10-10 ENCOUNTER — Encounter (HOSPITAL_COMMUNITY)
Admission: RE | Disposition: A | Discharge status: Home / Self Care | Payer: Self-pay | Source: Home / Self Care | Attending: Surgery

## 2020-10-10 ENCOUNTER — Inpatient Hospital Stay (HOSPITAL_COMMUNITY): Payer: BC Managed Care – PPO | Admitting: Certified Registered Nurse Anesthetist

## 2020-10-10 ENCOUNTER — Encounter (HOSPITAL_COMMUNITY): Payer: Self-pay | Admitting: Surgery

## 2020-10-10 ENCOUNTER — Other Ambulatory Visit: Payer: Self-pay

## 2020-10-10 ENCOUNTER — Inpatient Hospital Stay (HOSPITAL_COMMUNITY)
Admission: RE | Admit: 2020-10-10 | Discharge: 2020-10-11 | DRG: 621 | Disposition: A | Discharge status: Home / Self Care | Payer: BC Managed Care – PPO | Attending: Surgery | Admitting: Surgery

## 2020-10-10 DIAGNOSIS — Z8249 Family history of ischemic heart disease and other diseases of the circulatory system: Secondary | ICD-10-CM | POA: Diagnosis not present

## 2020-10-10 DIAGNOSIS — Z833 Family history of diabetes mellitus: Secondary | ICD-10-CM

## 2020-10-10 DIAGNOSIS — I10 Essential (primary) hypertension: Secondary | ICD-10-CM | POA: Diagnosis not present

## 2020-10-10 DIAGNOSIS — Z9049 Acquired absence of other specified parts of digestive tract: Secondary | ICD-10-CM

## 2020-10-10 DIAGNOSIS — Z825 Family history of asthma and other chronic lower respiratory diseases: Secondary | ICD-10-CM

## 2020-10-10 DIAGNOSIS — Z6839 Body mass index (BMI) 39.0-39.9, adult: Secondary | ICD-10-CM | POA: Diagnosis not present

## 2020-10-10 DIAGNOSIS — Z79890 Hormone replacement therapy (postmenopausal): Secondary | ICD-10-CM

## 2020-10-10 DIAGNOSIS — Z823 Family history of stroke: Secondary | ICD-10-CM

## 2020-10-10 DIAGNOSIS — F909 Attention-deficit hyperactivity disorder, unspecified type: Secondary | ICD-10-CM | POA: Diagnosis present

## 2020-10-10 DIAGNOSIS — K317 Polyp of stomach and duodenum: Secondary | ICD-10-CM | POA: Diagnosis not present

## 2020-10-10 DIAGNOSIS — F32A Depression, unspecified: Secondary | ICD-10-CM | POA: Diagnosis not present

## 2020-10-10 DIAGNOSIS — Z87441 Personal history of nephrotic syndrome: Secondary | ICD-10-CM

## 2020-10-10 DIAGNOSIS — Z79899 Other long term (current) drug therapy: Secondary | ICD-10-CM | POA: Diagnosis not present

## 2020-10-10 DIAGNOSIS — G4730 Sleep apnea, unspecified: Secondary | ICD-10-CM | POA: Diagnosis not present

## 2020-10-10 HISTORY — PX: LAPAROSCOPIC GASTRIC SLEEVE RESECTION: SHX5895

## 2020-10-10 HISTORY — PX: UPPER GI ENDOSCOPY: SHX6162

## 2020-10-10 LAB — TYPE AND SCREEN
ABO/RH(D): A POS
Antibody Screen: NEGATIVE

## 2020-10-10 LAB — HEMOGLOBIN AND HEMATOCRIT, BLOOD
HCT: 49.8 % (ref 39.0–52.0)
Hemoglobin: 16.7 g/dL (ref 13.0–17.0)

## 2020-10-10 LAB — ABO/RH: ABO/RH(D): A POS

## 2020-10-10 SURGERY — GASTRECTOMY, SLEEVE, LAPAROSCOPIC
Anesthesia: General | Site: Throat

## 2020-10-10 MED ORDER — MEPERIDINE HCL 50 MG/ML IJ SOLN: 6.2500 mg | INTRAMUSCULAR | Status: DC | PRN

## 2020-10-10 MED ORDER — KETAMINE HCL 10 MG/ML IJ SOLN
INTRAMUSCULAR | Status: AC
  Filled 2020-10-10: qty 1

## 2020-10-10 MED ORDER — GABAPENTIN 300 MG PO CAPS
300.0000 mg | ORAL_CAPSULE | ORAL | Status: AC
  Administered 2020-10-10: 300 mg via ORAL
  Filled 2020-10-10: qty 1

## 2020-10-10 MED ORDER — BUPIVACAINE-EPINEPHRINE 0.25% -1:200000 IJ SOLN
INTRAMUSCULAR | Status: DC | PRN
  Administered 2020-10-10: 30 mL

## 2020-10-10 MED ORDER — DEXAMETHASONE SODIUM PHOSPHATE 10 MG/ML IJ SOLN
INTRAMUSCULAR | Status: AC
  Filled 2020-10-10: qty 1

## 2020-10-10 MED ORDER — GABAPENTIN 100 MG PO CAPS
200.0000 mg | ORAL_CAPSULE | Freq: Two times a day (BID) | ORAL | Status: DC
  Administered 2020-10-10 – 2020-10-11 (×3): 200 mg via ORAL
  Filled 2020-10-10 (×3): qty 2

## 2020-10-10 MED ORDER — ACETAMINOPHEN 500 MG PO TABS
1000.0000 mg | ORAL_TABLET | Freq: Three times a day (TID) | ORAL | Status: DC
  Administered 2020-10-10 – 2020-10-11 (×3): 1000 mg via ORAL
  Filled 2020-10-10 (×3): qty 2

## 2020-10-10 MED ORDER — SODIUM CHLORIDE 0.9 % IV SOLN
2.0000 g | INTRAVENOUS | Status: AC
  Administered 2020-10-10: 2 g via INTRAVENOUS
  Filled 2020-10-10: qty 2

## 2020-10-10 MED ORDER — KETAMINE HCL 10 MG/ML IJ SOLN
INTRAMUSCULAR | Status: DC | PRN
  Administered 2020-10-10: 35 mg via INTRAVENOUS

## 2020-10-10 MED ORDER — ENSURE MAX PROTEIN PO LIQD
2.0000 [oz_av] | ORAL | Status: DC
  Administered 2020-10-11 (×2): 2 [oz_av] via ORAL

## 2020-10-10 MED ORDER — PROMETHAZINE HCL 25 MG/ML IJ SOLN: 6.2500 mg | INTRAMUSCULAR | Status: DC | PRN

## 2020-10-10 MED ORDER — PROPOFOL 10 MG/ML IV BOLUS
INTRAVENOUS | Status: AC
  Filled 2020-10-10: qty 20

## 2020-10-10 MED ORDER — CHLORHEXIDINE GLUCONATE 4 % EX LIQD: 60.0000 mL | Freq: Once | CUTANEOUS | Status: DC

## 2020-10-10 MED ORDER — LACTATED RINGERS IR SOLN
Status: DC | PRN
  Administered 2020-10-10: 1000 mL

## 2020-10-10 MED ORDER — MIDAZOLAM HCL 2 MG/2ML IJ SOLN: 0.5000 mg | Freq: Once | INTRAMUSCULAR | Status: DC | PRN

## 2020-10-10 MED ORDER — METOPROLOL TARTRATE 5 MG/5ML IV SOLN: 5.0000 mg | Freq: Four times a day (QID) | INTRAVENOUS | Status: DC | PRN

## 2020-10-10 MED ORDER — HYDROMORPHONE HCL 1 MG/ML IJ SOLN
INTRAMUSCULAR | Status: AC
  Filled 2020-10-10: qty 1

## 2020-10-10 MED ORDER — ROCURONIUM BROMIDE 10 MG/ML (PF) SYRINGE
PREFILLED_SYRINGE | INTRAVENOUS | Status: AC
  Filled 2020-10-10: qty 10

## 2020-10-10 MED ORDER — ENOXAPARIN SODIUM 30 MG/0.3ML ~~LOC~~ SOLN
30.0000 mg | Freq: Two times a day (BID) | SUBCUTANEOUS | Status: DC
  Administered 2020-10-10 – 2020-10-11 (×2): 30 mg via SUBCUTANEOUS
  Filled 2020-10-10 (×2): qty 0.3

## 2020-10-10 MED ORDER — LISDEXAMFETAMINE DIMESYLATE 30 MG PO CAPS
30.0000 mg | ORAL_CAPSULE | Freq: Every day | ORAL | Status: DC
  Administered 2020-10-10 – 2020-10-11 (×2): 30 mg via ORAL
  Filled 2020-10-10 (×2): qty 1

## 2020-10-10 MED ORDER — ACETAMINOPHEN 160 MG/5ML PO SOLN: 1000.0000 mg | Freq: Three times a day (TID) | ORAL | Status: DC

## 2020-10-10 MED ORDER — DOCUSATE SODIUM 100 MG PO CAPS
100.0000 mg | ORAL_CAPSULE | Freq: Two times a day (BID) | ORAL | Status: DC
  Administered 2020-10-10 – 2020-10-11 (×3): 100 mg via ORAL
  Filled 2020-10-10 (×3): qty 1

## 2020-10-10 MED ORDER — KETOROLAC TROMETHAMINE 15 MG/ML IJ SOLN
15.0000 mg | Freq: Four times a day (QID) | INTRAMUSCULAR | Status: DC | PRN
  Administered 2020-10-10: 15 mg via INTRAVENOUS
  Filled 2020-10-10: qty 1

## 2020-10-10 MED ORDER — FENTANYL CITRATE (PF) 100 MCG/2ML IJ SOLN
INTRAMUSCULAR | Status: AC
  Filled 2020-10-10: qty 2

## 2020-10-10 MED ORDER — OXYCODONE HCL 5 MG/5ML PO SOLN: 5.0000 mg | Freq: Once | ORAL | Status: DC | PRN

## 2020-10-10 MED ORDER — APREPITANT 40 MG PO CAPS
40.0000 mg | ORAL_CAPSULE | ORAL | Status: AC
  Administered 2020-10-10: 40 mg via ORAL
  Filled 2020-10-10: qty 1

## 2020-10-10 MED ORDER — HEPARIN SODIUM (PORCINE) 5000 UNIT/ML IJ SOLN
5000.0000 [IU] | INTRAMUSCULAR | Status: AC
  Administered 2020-10-10: 5000 [IU] via SUBCUTANEOUS
  Filled 2020-10-10: qty 1

## 2020-10-10 MED ORDER — PHENYLEPHRINE 40 MCG/ML (10ML) SYRINGE FOR IV PUSH (FOR BLOOD PRESSURE SUPPORT)
PREFILLED_SYRINGE | INTRAVENOUS | Status: DC | PRN
  Administered 2020-10-10: 80 ug via INTRAVENOUS

## 2020-10-10 MED ORDER — LIDOCAINE HCL (PF) 2 % IJ SOLN
INTRAMUSCULAR | Status: DC | PRN
  Administered 2020-10-10: 1.5 mg/kg/h via INTRADERMAL

## 2020-10-10 MED ORDER — OXYCODONE HCL 5 MG PO TABS: 5.0000 mg | ORAL_TABLET | Freq: Once | ORAL | Status: DC | PRN

## 2020-10-10 MED ORDER — PHENYLEPHRINE 40 MCG/ML (10ML) SYRINGE FOR IV PUSH (FOR BLOOD PRESSURE SUPPORT)
PREFILLED_SYRINGE | INTRAVENOUS | Status: AC
  Filled 2020-10-10: qty 10

## 2020-10-10 MED ORDER — TRAMADOL HCL 50 MG PO TABS
50.0000 mg | ORAL_TABLET | Freq: Four times a day (QID) | ORAL | Status: DC | PRN
Start: 2020-10-10 — End: 2020-10-11
  Administered 2020-10-10 – 2020-10-11 (×2): 50 mg via ORAL
  Filled 2020-10-10 (×2): qty 1

## 2020-10-10 MED ORDER — SIMETHICONE 80 MG PO CHEW
80.0000 mg | CHEWABLE_TABLET | Freq: Four times a day (QID) | ORAL | Status: DC | PRN
  Administered 2020-10-10: 80 mg via ORAL
  Filled 2020-10-10: qty 1

## 2020-10-10 MED ORDER — LIDOCAINE 2% (20 MG/ML) 5 ML SYRINGE
INTRAMUSCULAR | Status: AC
  Filled 2020-10-10: qty 5

## 2020-10-10 MED ORDER — LISDEXAMFETAMINE DIMESYLATE 20 MG PO CAPS
20.0000 mg | ORAL_CAPSULE | Freq: Every day | ORAL | Status: DC
  Administered 2020-10-10 – 2020-10-11 (×2): 20 mg via ORAL
  Filled 2020-10-10 (×2): qty 1

## 2020-10-10 MED ORDER — METOCLOPRAMIDE HCL 5 MG/ML IJ SOLN
10.0000 mg | Freq: Four times a day (QID) | INTRAMUSCULAR | Status: DC
Start: 2020-10-10 — End: 2020-10-11
  Administered 2020-10-10 – 2020-10-11 (×5): 10 mg via INTRAVENOUS
  Filled 2020-10-10 (×5): qty 2

## 2020-10-10 MED ORDER — DEXAMETHASONE SODIUM PHOSPHATE 10 MG/ML IJ SOLN
INTRAMUSCULAR | Status: DC | PRN
  Administered 2020-10-10: 10 mg via INTRAVENOUS

## 2020-10-10 MED ORDER — FENTANYL CITRATE (PF) 100 MCG/2ML IJ SOLN
INTRAMUSCULAR | Status: DC | PRN
  Administered 2020-10-10: 50 ug via INTRAVENOUS
  Administered 2020-10-10: 100 ug via INTRAVENOUS
  Administered 2020-10-10: 25 ug via INTRAVENOUS
  Administered 2020-10-10: 50 ug via INTRAVENOUS
  Administered 2020-10-10 (×2): 25 ug via INTRAVENOUS

## 2020-10-10 MED ORDER — ACETAMINOPHEN 500 MG PO TABS
1000.0000 mg | ORAL_TABLET | ORAL | Status: AC
  Administered 2020-10-10: 1000 mg via ORAL
  Filled 2020-10-10: qty 2

## 2020-10-10 MED ORDER — HYDRALAZINE HCL 20 MG/ML IJ SOLN: 10.0000 mg | INTRAMUSCULAR | Status: DC | PRN

## 2020-10-10 MED ORDER — LACTATED RINGERS IV SOLN: INTRAVENOUS | Status: DC

## 2020-10-10 MED ORDER — ACETAMINOPHEN 500 MG PO TABS: 1000.0000 mg | ORAL_TABLET | Freq: Once | ORAL | Status: DC

## 2020-10-10 MED ORDER — LISDEXAMFETAMINE DIMESYLATE 50 MG PO CAPS: 50.0000 mg | ORAL_CAPSULE | Freq: Every day | ORAL | Status: DC

## 2020-10-10 MED ORDER — SODIUM CHLORIDE 0.9 % IV SOLN: INTRAVENOUS | Status: DC

## 2020-10-10 MED ORDER — BUPIVACAINE-EPINEPHRINE (PF) 0.25% -1:200000 IJ SOLN
INTRAMUSCULAR | Status: AC
  Filled 2020-10-10: qty 30

## 2020-10-10 MED ORDER — BUPIVACAINE LIPOSOME 1.3 % IJ SUSP
INTRAMUSCULAR | Status: DC | PRN
  Administered 2020-10-10: 20 mL

## 2020-10-10 MED ORDER — ORAL CARE MOUTH RINSE: 15.0000 mL | Freq: Once | OROMUCOSAL | Status: AC

## 2020-10-10 MED ORDER — SUGAMMADEX SODIUM 200 MG/2ML IV SOLN
INTRAVENOUS | Status: DC | PRN
  Administered 2020-10-10: 300 mg via INTRAVENOUS

## 2020-10-10 MED ORDER — ESCITALOPRAM OXALATE 10 MG PO TABS
10.0000 mg | ORAL_TABLET | Freq: Every day | ORAL | Status: DC
  Administered 2020-10-10 – 2020-10-11 (×2): 10 mg via ORAL
  Filled 2020-10-10 (×2): qty 1

## 2020-10-10 MED ORDER — MIDAZOLAM HCL 2 MG/2ML IJ SOLN
INTRAMUSCULAR | Status: AC
  Filled 2020-10-10: qty 2

## 2020-10-10 MED ORDER — LIDOCAINE HCL (CARDIAC) PF 100 MG/5ML IV SOSY
PREFILLED_SYRINGE | INTRAVENOUS | Status: DC | PRN
  Administered 2020-10-10: 25 mg via INTRAVENOUS

## 2020-10-10 MED ORDER — SCOPOLAMINE 1 MG/3DAYS TD PT72
1.0000 | MEDICATED_PATCH | TRANSDERMAL | Status: DC
  Administered 2020-10-10: 1.5 mg via TRANSDERMAL
  Filled 2020-10-10: qty 1

## 2020-10-10 MED ORDER — EPHEDRINE SULFATE-NACL 50-0.9 MG/10ML-% IV SOSY
PREFILLED_SYRINGE | INTRAVENOUS | Status: DC | PRN
  Administered 2020-10-10: 10 mg via INTRAVENOUS

## 2020-10-10 MED ORDER — OXYCODONE HCL 5 MG/5ML PO SOLN
5.0000 mg | Freq: Four times a day (QID) | ORAL | Status: DC | PRN
  Filled 2020-10-10: qty 5

## 2020-10-10 MED ORDER — METHOCARBAMOL 1000 MG/10ML IJ SOLN
500.0000 mg | Freq: Four times a day (QID) | INTRAVENOUS | Status: DC | PRN
  Filled 2020-10-10: qty 5

## 2020-10-10 MED ORDER — ONDANSETRON HCL 4 MG/2ML IJ SOLN
INTRAMUSCULAR | Status: AC
  Filled 2020-10-10: qty 2

## 2020-10-10 MED ORDER — CHLORHEXIDINE GLUCONATE 0.12 % MT SOLN
15.0000 mL | Freq: Once | OROMUCOSAL | Status: AC
  Administered 2020-10-10: 15 mL via OROMUCOSAL

## 2020-10-10 MED ORDER — ROCURONIUM BROMIDE 100 MG/10ML IV SOLN
INTRAVENOUS | Status: DC | PRN
  Administered 2020-10-10: 15 mg via INTRAVENOUS
  Administered 2020-10-10: 65 mg via INTRAVENOUS
  Administered 2020-10-10 (×2): 10 mg via INTRAVENOUS

## 2020-10-10 MED ORDER — SUGAMMADEX SODIUM 500 MG/5ML IV SOLN
INTRAVENOUS | Status: AC
  Filled 2020-10-10: qty 5

## 2020-10-10 MED ORDER — HYDROMORPHONE HCL 1 MG/ML IJ SOLN
0.2500 mg | INTRAMUSCULAR | Status: DC | PRN
  Administered 2020-10-10 (×2): 0.5 mg via INTRAVENOUS

## 2020-10-10 MED ORDER — ONDANSETRON HCL 4 MG/2ML IJ SOLN: 4.0000 mg | INTRAMUSCULAR | Status: DC | PRN

## 2020-10-10 MED ORDER — FENTANYL CITRATE (PF) 250 MCG/5ML IJ SOLN
INTRAMUSCULAR | Status: AC
  Filled 2020-10-10: qty 5

## 2020-10-10 MED ORDER — PANTOPRAZOLE SODIUM 40 MG IV SOLR
40.0000 mg | Freq: Every day | INTRAVENOUS | Status: DC
  Administered 2020-10-10: 40 mg via INTRAVENOUS
  Filled 2020-10-10: qty 40

## 2020-10-10 MED ORDER — HYDROMORPHONE HCL 1 MG/ML IJ SOLN: 0.5000 mg | INTRAMUSCULAR | Status: DC | PRN

## 2020-10-10 MED ORDER — ONDANSETRON HCL 4 MG/2ML IJ SOLN
INTRAMUSCULAR | Status: DC | PRN
  Administered 2020-10-10: 4 mg via INTRAVENOUS

## 2020-10-10 MED ORDER — MIDAZOLAM HCL 5 MG/5ML IJ SOLN
INTRAMUSCULAR | Status: DC | PRN
  Administered 2020-10-10 (×2): 1 mg via INTRAVENOUS

## 2020-10-10 MED ORDER — EPHEDRINE 5 MG/ML INJ
INTRAVENOUS | Status: AC
  Filled 2020-10-10: qty 10

## 2020-10-10 MED ORDER — PROPOFOL 10 MG/ML IV BOLUS
INTRAVENOUS | Status: DC | PRN
  Administered 2020-10-10: 200 mg via INTRAVENOUS

## 2020-10-10 SURGICAL SUPPLY — 62 items
APPLIER CLIP ROT 10 11.4 M/L (STAPLE)
APPLIER CLIP ROT 13.4 12 LRG (CLIP) ×3
BAG LAPAROSCOPIC 12 15 PORT 16 (BASKET) IMPLANT
BAG RETRIEVAL 12/15 (BASKET)
BENZOIN TINCTURE PRP APPL 2/3 (GAUZE/BANDAGES/DRESSINGS) ×3 IMPLANT
BLADE SURG SZ11 CARB STEEL (BLADE) ×3 IMPLANT
BNDG ADH 1X3 SHEER STRL LF (GAUZE/BANDAGES/DRESSINGS) ×3 IMPLANT
CABLE HIGH FREQUENCY MONO STRZ (ELECTRODE) ×3 IMPLANT
CHLORAPREP W/TINT 26 (MISCELLANEOUS) ×6 IMPLANT
CLIP APPLIE ROT 10 11.4 M/L (STAPLE) IMPLANT
CLIP APPLIE ROT 13.4 12 LRG (CLIP) ×2 IMPLANT
COVER SURGICAL LIGHT HANDLE (MISCELLANEOUS) ×3 IMPLANT
COVER WAND RF STERILE (DRAPES) IMPLANT
DECANTER SPIKE VIAL GLASS SM (MISCELLANEOUS) ×3 IMPLANT
DEVICE SUT QUICK LOAD TK 5 (STAPLE) IMPLANT
DEVICE SUT TI-KNOT TK 5X26 (MISCELLANEOUS) IMPLANT
DRAPE UTILITY XL STRL (DRAPES) ×6 IMPLANT
ELECT REM PT RETURN 15FT ADLT (MISCELLANEOUS) ×3 IMPLANT
GAUZE SPONGE 4X4 12PLY STRL (GAUZE/BANDAGES/DRESSINGS) IMPLANT
GLOVE SURG ENC MOIS LTX SZ6 (GLOVE) ×3 IMPLANT
GLOVE SURG UNDER LTX SZ6.5 (GLOVE) ×3 IMPLANT
GOWN STRL REUS W/TWL LRG LVL3 (GOWN DISPOSABLE) ×3 IMPLANT
GOWN STRL REUS W/TWL XL LVL3 (GOWN DISPOSABLE) ×6 IMPLANT
GRASPER SUT TROCAR 14GX15 (MISCELLANEOUS) ×3 IMPLANT
KIT BASIN OR (CUSTOM PROCEDURE TRAY) ×3 IMPLANT
KIT TURNOVER KIT A (KITS) ×3 IMPLANT
MARKER SKIN DUAL TIP RULER LAB (MISCELLANEOUS) ×3 IMPLANT
MAT PREVALON FULL STRYKER (MISCELLANEOUS) ×3 IMPLANT
NEEDLE SPNL 22GX3.5 QUINCKE BK (NEEDLE) ×3 IMPLANT
PACK UNIVERSAL I (CUSTOM PROCEDURE TRAY) ×3 IMPLANT
RELOAD ENDO STITCH (ENDOMECHANICALS) IMPLANT
RELOAD STAPLER BLUE 60MM (STAPLE) ×8 IMPLANT
RELOAD STAPLER GOLD 60MM (STAPLE) ×2 IMPLANT
RELOAD STAPLER GREEN 60MM (STAPLE) ×2 IMPLANT
SCISSORS LAP 5X45 EPIX DISP (ENDOMECHANICALS) ×3 IMPLANT
SET IRRIG TUBING LAPAROSCOPIC (IRRIGATION / IRRIGATOR) ×3 IMPLANT
SET TUBE SMOKE EVAC HIGH FLOW (TUBING) ×3 IMPLANT
SHEARS HARMONIC ACE PLUS 45CM (MISCELLANEOUS) ×3 IMPLANT
SLEEVE ADV FIXATION 5X100MM (TROCAR) ×6 IMPLANT
SLEEVE GASTRECTOMY 40FR VISIGI (MISCELLANEOUS) ×3 IMPLANT
SOL ANTI FOG 6CC (MISCELLANEOUS) ×2 IMPLANT
SOLUTION ANTI FOG 6CC (MISCELLANEOUS) ×1
SPONGE LAP 18X18 RF (DISPOSABLE) ×3 IMPLANT
STAPLER ECHELON BIOABSB 60 FLE (MISCELLANEOUS) IMPLANT
STAPLER ECHELON LONG 60 440 (INSTRUMENTS) ×3 IMPLANT
STAPLER RELOAD BLUE 60MM (STAPLE) ×12
STAPLER RELOAD GOLD 60MM (STAPLE) ×3
STAPLER RELOAD GREEN 60MM (STAPLE) ×3
STRIP CLOSURE SKIN 1/2X4 (GAUZE/BANDAGES/DRESSINGS) ×3 IMPLANT
SUT MNCRL AB 4-0 PS2 18 (SUTURE) ×3 IMPLANT
SUT SURGIDAC NAB ES-9 0 48 120 (SUTURE) IMPLANT
SUT VICRYL 0 TIES 12 18 (SUTURE) ×3 IMPLANT
SYR 10ML ECCENTRIC (SYRINGE) ×3 IMPLANT
SYR 20ML LL LF (SYRINGE) ×3 IMPLANT
SYR 50ML LL SCALE MARK (SYRINGE) ×3 IMPLANT
TOWEL OR 17X26 10 PK STRL BLUE (TOWEL DISPOSABLE) ×3 IMPLANT
TOWEL OR NON WOVEN STRL DISP B (DISPOSABLE) ×3 IMPLANT
TROCAR ADV FIXATION 5X100MM (TROCAR) ×3 IMPLANT
TROCAR BLADELESS 15MM (ENDOMECHANICALS) ×3 IMPLANT
TROCAR BLADELESS OPT 5 100 (ENDOMECHANICALS) ×3 IMPLANT
TUBING CONNECTING 10 (TUBING) ×3 IMPLANT
TUBING ENDO SMARTCAP (MISCELLANEOUS) ×3 IMPLANT

## 2020-10-10 NOTE — Progress Notes (Signed)
Discussed post op day goals with patient including ambulation, IS, diet progression, pain, and nausea control.  BSTOP education provided including BSTOP information guide, "Guide for Pain Management after your Bariatric Procedure".  Questions answered. 

## 2020-10-10 NOTE — Transfer of Care (Signed)
Immediate Anesthesia Transfer of Care Note  Patient: John Faulkner  Procedure(s) Performed: LAPAROSCOPIC GASTRIC SLEEVE RESECTION (N/A Abdomen) UPPER GI ENDOSCOPY (N/A Throat)  Patient Location: PACU  Anesthesia Type:General  Level of Consciousness: oriented, drowsy and patient cooperative  Airway & Oxygen Therapy: Patient Spontanous Breathing and Patient connected to face mask oxygen  Post-op Assessment: Report given to RN and Post -op Vital signs reviewed and stable  Post vital signs: Reviewed and stable  Last Vitals:  Vitals Value Taken Time  BP    Temp    Pulse 79 10/10/20 0924  Resp 16 10/10/20 0924  SpO2 100 % 10/10/20 0924  Vitals shown include unvalidated device data.  Last Pain:  Vitals:   10/10/20 0625  TempSrc:   PainSc: 0-No pain         Complications: No complications documented.

## 2020-10-10 NOTE — Op Note (Signed)
Operative Note  John Faulkner  [id number]  [id number]  10/10/2020   Surgeon: Phylliss Blakes MD   Assistant: Wenda Low MD   Procedure performed: laparoscopic sleeve gastrectomy, upper endoscopy   Preop diagnosis: Morbid obesity Body mass index is 39.05 kg/m. Post-op diagnosis/intraop findings: same   Specimens: fundus Retained items: none  EBL: minimal  Complications: none   Description of procedure: After obtaining informed consent and administration of chemical DVT prophylaxis in holding, the patient was taken to the operating room and placed supine on operating room table where general endotracheal anesthesia was initiated, preoperative antibiotics were administered, SCDs applied, and a formal timeout was performed. The abdomen was prepped and draped in usual sterile fashion. Peritoneal access was gained using a Visiport technique in the left upper quadrant and insufflation to 15 mmHg ensued without issue. Gross inspection revealed no evidence of injury. Under direct visualization three more 5 mm trochars were placed in the right and left hemiabdomen and the 59mm trocar in the right paramedian upper abdomen. Bilateral laparoscopic assisted TAPS blocks were performed with Exparel diluted with 0.25 percent Marcaine with epinephrine. The patient was placed in steep Trendelenburg and the liver retractor was introduced through an incision in the upper midline and secured to the post externally to maintain the left lobe retracted anteriorly.  There is no hiatal hernia on direct inspection.  Using the Harmonic scalpel, the greater curvature of the stomach was dissected away from the greater omentum and short gastric vessels were divided. This began 6 cm from the pylorus, and dissection proceeded until the left crus was clearly exposed. There were some filmy adhesions of the posterior stomach to the pancreas which were divided with the Harmonic.  A small amount of bleeding from a divided short  gastric vessel proximally was controlled with a clip. The 45 [place] VisiGi was then introduced and directed down towards the pylorus. This was placed to suction against the lesser curve. Serial fires of the linear cutting stapler were then employed to create our sleeve. The first fire used a green load and ensured adequate room at the angularis incisura. One gold load and then several blue loads were then employed to create a narrow tubular stomach up to the angle of His. The excised stomach was then removed through our 15 mm trocar site.  The visigi was taken off of suction and a few puffs of air were introduced, inflating the sleeve. No bubbles were observed in the irrigation fluid around the stomach and the shape was noted to be evenly tubular without any narrowing at the angularis. The visigi was then removed. Upper endoscopy was performed by the assistant surgeon and the sleeve was noted to be airtight, the lumen easily traversable to the level of the pylorus; the staple line was hemostatic. Please see his separate note. The endoscope was removed. A small amount of oozing on the staple line was addressed with clips. The 15 mm trocar site fascia in the right upper abdomen was closed with a 0 Vicryl using the laparoscopic suture passer under direct visualization. The liver retractor was removed under direct visualization. The abdomen was then desufflated and all remaining trochars removed. The skin incisions were closed with subcuticular Monocryl; benzoin, Steri-Strips and Band-Aids were applied The patient was then awakened, extubated and taken to PACU in stable condition.     All counts were correct at the completion of the case.

## 2020-10-10 NOTE — Op Note (Signed)
KIMI KROFT [id number] [birth year] 10/10/2020  Preoperative diagnosis: sleeve gastrectomy in progress  Postoperative diagnosis: Same   Procedure: Upper endoscopy   Surgeon: Susy Frizzle B. Daphine Deutscher  M.D., FACS   Anesthesia: Gen.   Indications for procedure: This patient was undergoing a sleeve gastrecstomy.    Description of procedure: The endoscopy was placed in the mouth and into the oropharynx and under endoscopic vision it was advanced to the esophagogastric junction.  The pouch was insufflated and it was cylindrical and the scope was passed into the antrum and the pylorus was identified.  .   No bleeding or leaks were detected.  The scope was withdrawn without difficulty.     Matt B. Daphine Deutscher, MD, FACS General, Bariatric, & Minimally Invasive Surgery [facility], [place]

## 2020-10-10 NOTE — Anesthesia Procedure Notes (Signed)
Procedure Name: Intubation Date/Time: 10/10/2020 7:41 AM Performed by: Garrel Ridgel, CRNA Pre-anesthesia Checklist: Patient identified, Emergency Drugs available, Suction available and Patient being monitored Patient Re-evaluated:Patient Re-evaluated prior to induction Oxygen Delivery Method: Circle system utilized Preoxygenation: Pre-oxygenation with 100% oxygen Induction Type: IV induction Ventilation: Mask ventilation without difficulty Laryngoscope Size: Mac and 4 Grade View: Grade I Tube type: Oral Tube size: 7.5 mm Number of attempts: 1 Airway Equipment and Method: Stylet and Oral airway Placement Confirmation: ETT inserted through vocal cords under direct vision,  positive ETCO2 and breath sounds checked- equal and bilateral Secured at: 23 cm Tube secured with: Tape Dental Injury: Teeth and Oropharynx as per pre-operative assessment

## 2020-10-10 NOTE — Anesthesia Postprocedure Evaluation (Signed)
Anesthesia Post Note  Patient: OTHMAN MASUR  Procedure(s) Performed: LAPAROSCOPIC GASTRIC SLEEVE RESECTION (N/A Abdomen) UPPER GI ENDOSCOPY (N/A Throat)     Patient location during evaluation: PACU Anesthesia Type: General Level of consciousness: awake and alert, patient cooperative and oriented Pain management: pain level controlled Vital Signs Assessment: post-procedure vital signs reviewed and stable Respiratory status: spontaneous breathing, nonlabored ventilation and respiratory function stable Cardiovascular status: blood pressure returned to baseline and stable Postop Assessment: no apparent nausea or vomiting Anesthetic complications: no   No complications documented.  Last Vitals:  Vitals:   10/10/20 1053 10/10/20 1210  BP: 133/85 137/76  Pulse: 62 (!) 58  Resp: 18 17  Temp:  36.8 C  SpO2: 99% 100%    Last Pain:  Vitals:   10/10/20 1210  TempSrc: Oral  PainSc:                  Perl Kerney,E. Kissie Ziolkowski

## 2020-10-10 NOTE — Interval H&P Note (Signed)
History and Physical Interval Note:  10/10/2020 7:00 AM  John Faulkner  has presented today for surgery, with the diagnosis of MORBID OBESITY.  The various methods of treatment have been discussed with the patient and family. After consideration of risks, benefits and other options for treatment, the patient has consented to  Procedure(s): LAPAROSCOPIC GASTRIC SLEEVE RESECTION (N/A) UPPER GI ENDOSCOPY (N/A) as a surgical intervention.  The patient's history has been reviewed, patient examined, no change in status, stable for surgery.  I have reviewed the patient's chart and labs.  Questions were answered to the patient's satisfaction.     Seville Brick Lollie Sails

## 2020-10-10 NOTE — Progress Notes (Signed)
PHARMACY CONSULT FOR:  Risk Assessment for Post-Discharge VTE Following Bariatric Surgery  Post-Discharge VTE Risk Assessment: This patient's probability of 30-day post-discharge VTE is increased due to the factors marked: X  Male    Age >/=60 years    BMI >/=50 kg/m2    CHF    Dyspnea at Rest    Paraplegia  X  Non-gastric-band surgery    Operation Time >/=3 hr    Return to OR     Length of Stay >/= 3 d   Hx of VTE   Hypercoagulable condition   Significant venous stasis     Predicted probability of 30-day post-discharge VTE: 0.31%  Other patient-specific factors to consider: none  Recommendation for Discharge: No pharmacologic prophylaxis post-  John Faulkner is a 35 y.o. male who underwent laparoscopic sleeve gastrectomy 10/10/2020  No Known Allergies  Patient Measurements: Height: 6\' 1"  (185.4 cm) Weight: 134.3 kg (296 lb) IBW/kg (Calculated) : 79.9 Body mass index is 39.05 kg/m.  No results for input(s): WBC, HGB, HCT, PLT, APTT, CREATININE, LABCREA, CREATININE, CREAT24HRUR, MG, PHOS, ALBUMIN, PROT, ALBUMIN, AST, ALT, ALKPHOS, BILITOT, BILIDIR, IBILI in the last 72 hours. Estimated Creatinine Clearance: 176.1 mL/min (by C-G formula based on SCr of 0.85 mg/dL).    Past Medical History:  Diagnosis Date  . Acute calculous cholecystitis 08/13/2011  . Anxiety   . Depression   . Elevated blood pressure   . Hypertension   . Nephrotic syndrome 1989  . Sleep apnea      Medications Prior to Admission  Medication Sig Dispense Refill Last Dose  . amLODipine (NORVASC) 10 MG tablet Take 1 tablet (10 mg total) by mouth daily. 90 tablet 1 10/08/2020  . cetirizine (ZYRTEC) 10 MG tablet Take 10 mg by mouth daily.   10/08/2020  . escitalopram (LEXAPRO) 10 MG tablet Take 1 tablet (10 mg total) by mouth daily. 90 tablet 1 10/08/2020  . lisinopril (ZESTRIL) 10 MG tablet Take 1 tablet (10 mg total) by mouth daily. 90 tablet 0 10/08/2020  . testosterone cypionate  (DEPOTESTOSTERONE CYPIONATE) 200 MG/ML injection Inject 200 mg into the muscle every 14 (fourteen) days.   10/06/2020 at Unknown time  . lisdexamfetamine (VYVANSE) 50 MG capsule Take 1 capsule (50 mg total) by mouth daily. 30 capsule 0 10/08/2020      10/10/2020, Pharm.D 10/10/2020 10:44 AM

## 2020-10-11 ENCOUNTER — Encounter (HOSPITAL_COMMUNITY): Payer: Self-pay | Admitting: Surgery

## 2020-10-11 ENCOUNTER — Other Ambulatory Visit (HOSPITAL_COMMUNITY): Payer: Self-pay

## 2020-10-11 LAB — COMPREHENSIVE METABOLIC PANEL
ALT: 24 U/L (ref 0–44)
AST: 20 U/L (ref 15–41)
Albumin: 3.7 g/dL (ref 3.5–5.0)
Alkaline Phosphatase: 50 U/L (ref 38–126)
Anion gap: 10 (ref 5–15)
BUN: 9 mg/dL (ref 6–20)
CO2: 19 mmol/L — ABNORMAL LOW (ref 22–32)
Calcium: 8.2 mg/dL — ABNORMAL LOW (ref 8.9–10.3)
Chloride: 107 mmol/L (ref 98–111)
Creatinine, Ser: 0.84 mg/dL (ref 0.61–1.24)
GFR, Estimated: >60 mL/min (ref >60)
Glucose, Bld: 87 mg/dL (ref 70–99)
Potassium: 4 mmol/L (ref 3.5–5.1)
Sodium: 136 mmol/L (ref 135–145)
Total Bilirubin: 0.8 mg/dL (ref 0.3–1.2)
Total Protein: 6.3 g/dL — ABNORMAL LOW (ref 6.5–8.1)

## 2020-10-11 LAB — CBC WITH DIFFERENTIAL/PLATELET
Abs Immature Granulocytes: 0.04 10*3/uL (ref 0.00–0.07)
Basophils Absolute: 0 10*3/uL (ref 0.0–0.1)
Basophils Relative: 0 %
Eosinophils Absolute: 0 10*3/uL (ref 0.0–0.5)
Eosinophils Relative: 0 %
HCT: 41.8 % (ref 39.0–52.0)
Hemoglobin: 13.9 g/dL (ref 13.0–17.0)
Immature Granulocytes: 0 %
Lymphocytes Relative: 11 %
Lymphs Abs: 1.2 10*3/uL (ref 0.7–4.0)
MCH: 30.6 pg (ref 26.0–34.0)
MCHC: 33.3 g/dL (ref 30.0–36.0)
MCV: 92.1 fL (ref 80.0–100.0)
Monocytes Absolute: 1 10*3/uL (ref 0.1–1.0)
Monocytes Relative: 9 %
Neutro Abs: 8.9 10*3/uL — ABNORMAL HIGH (ref 1.7–7.7)
Neutrophils Relative %: 80 %
Platelets: 178 10*3/uL (ref 150–400)
RBC: 4.54 MIL/uL (ref 4.22–5.81)
RDW: 12.5 % (ref 11.5–15.5)
WBC: 11.2 10*3/uL — ABNORMAL HIGH (ref 4.0–10.5)
nRBC: 0 % (ref 0.0–0.2)

## 2020-10-11 LAB — SURGICAL PATHOLOGY

## 2020-10-11 LAB — MAGNESIUM: Magnesium: 2 mg/dL (ref 1.7–2.4)

## 2020-10-11 MED ORDER — DOCUSATE SODIUM 100 MG PO CAPS: 100.0000 mg | ORAL_CAPSULE | Freq: Two times a day (BID) | ORAL | 0 refills | Status: DC

## 2020-10-11 MED ORDER — GABAPENTIN 100 MG PO CAPS: 200.0000 mg | ORAL_CAPSULE | Freq: Two times a day (BID) | ORAL | 0 refills | Status: DC

## 2020-10-11 MED ORDER — AMLODIPINE BESYLATE 10 MG PO TABS: 5.0000 mg | ORAL_TABLET | Freq: Every day | ORAL | 0 refills | Status: DC

## 2020-10-11 MED ORDER — PANTOPRAZOLE SODIUM 40 MG PO TBEC: 40.0000 mg | DELAYED_RELEASE_TABLET | Freq: Every day | ORAL | 0 refills | Status: DC

## 2020-10-11 MED ORDER — TRAMADOL HCL 50 MG PO TABS: 50.0000 mg | ORAL_TABLET | Freq: Four times a day (QID) | ORAL | 0 refills | Status: DC | PRN

## 2020-10-11 MED ORDER — ACETAMINOPHEN 500 MG PO TABS: 1000.0000 mg | ORAL_TABLET | Freq: Three times a day (TID) | ORAL | 0 refills | Status: AC

## 2020-10-11 MED ORDER — ONDANSETRON 4 MG PO TBDP: 4.0000 mg | ORAL_TABLET | Freq: Four times a day (QID) | ORAL | 0 refills | Status: DC | PRN

## 2020-10-11 NOTE — Discharge Summary (Signed)
Physician Discharge Summary  ARNE SCHLENDER JKD:[id number] DOB: Feb 23, 1986 DOA: 10/10/2020  PCP: Debbrah Alar, NP  Admit date: 10/10/2020 Discharge date: 10/11/2020  Recommendations for Outpatient Follow-up:    Follow-up Information     Clovis Riley, MD. Go on 11/03/2020.   Specialty: General Surgery Why: at 9:20am. Please arrive 15 minutes prior to your appointment time. Thank you. Contact information: [address] [place] [address] [phone]         [facility], [place]. Go on 12/06/2020.   Specialty: General Surgery Why: at 1:50pm with Dr. Kae Heller.  Please arrive 15 minutes prior to your appointment time.  Thank you. Contact information: [place] [address] [phone]                Discharge Diagnoses:  Active Problems:   Morbid obesity ([place])   Surgical Procedure: Laparoscopic Sleeve Gastrectomy, upper endoscopy  Discharge Condition: Good Disposition: Home  Diet recommendation: Postoperative sleeve gastrectomy diet (liquids only)  Filed Weights   10/10/20 0530  Weight: 134.3 kg     Hospital Course:  The patient was admitted for a planned laparoscopic sleeve gastrectomy. Please see operative note. Preoperatively the patient was given 5000 units of subcutaneous heparin for DVT prophylaxis. Postoperative prophylactic Lovenox dosing was started on the evening of postoperative day 0. ERAS protocol was used. On the evening of postoperative day 0, the patient was started on water and ice chips. On postoperative day 1 the patient had no fever or tachycardia and was tolerating water in their diet was gradually advanced throughout the day. The patient was ambulating without difficulty. Their vital signs are stable without fever or tachycardia. The patient had received discharge instructions and counseling. They were deemed stable for discharge and had met discharge criteria   Discharge  Instructions  Discharge Instructions     Diet bariatric full liquid   Complete by: As directed       Allergies as of 10/11/2020   No Known Allergies      Medication List     STOP taking these medications    lisinopril 10 MG tablet Commonly known as: ZESTRIL       TAKE these medications    acetaminophen 500 MG tablet Commonly known as: TYLENOL Take 2 tablets (1,000 mg total) by mouth every 8 (eight) hours for 5 days.   amLODipine 10 MG tablet Commonly known as: NORVASC Take 0.5 tablets (5 mg total) by mouth daily. Monitor your blood pressure daily-blood pressure can change rapidly after bariatric surgery and you may need to quickly titrate this medication.  Contact your primary care doctor regarding further titration of your blood pressure medications What changed:  how much to take additional instructions Notes to patient: Monitor Blood Pressure Daily and keep a log for primary care physician.  You may need to make changes to your medications with rapid weight loss.     cetirizine 10 MG tablet Commonly known as: ZYRTEC Take 10 mg by mouth daily.   docusate sodium 100 MG capsule Commonly known as: COLACE Take 1 capsule (100 mg total) by mouth 2 (two) times daily.   escitalopram 10 MG tablet Commonly known as: LEXAPRO Take 1 tablet (10 mg total) by mouth daily.   gabapentin 100 MG capsule Commonly known as: NEURONTIN Take 2 capsules (200 mg total) by mouth every 12 (twelve) hours.   lisdexamfetamine 50 MG capsule Commonly known as: Vyvanse Take 1 capsule (50 mg total) by  mouth daily.   ondansetron 4 MG disintegrating tablet Commonly known as: ZOFRAN-ODT Take 1 tablet (4 mg total) by mouth every 6 (six) hours as needed for nausea or vomiting.   pantoprazole 40 MG tablet Commonly known as: PROTONIX Take 1 tablet (40 mg total) by mouth daily.   testosterone cypionate 200 MG/ML injection Commonly known as: DEPOTESTOSTERONE CYPIONATE Inject 200 mg into the  muscle every 14 (fourteen) days.   traMADol 50 MG tablet Commonly known as: ULTRAM Take 1 tablet (50 mg total) by mouth every 6 (six) hours as needed (pain).        Follow-up Information     Clovis Riley, MD. Go on 11/03/2020.   Specialty: General Surgery Why: at 9:20am. Please arrive 15 minutes prior to your appointment time. Thank you. Contact information: [address] [place] [address] [phone]         [facility], [place]. Go on 12/06/2020.   Specialty: General Surgery Why: at 1:50pm with Dr. Kae Heller.  Please arrive 15 minutes prior to your appointment time.  Thank you. Contact information: [place] [address] [phone]                  The results of significant diagnostics from this hospitalization (including imaging, microbiology, ancillary and laboratory) are listed below for reference.    Significant Diagnostic Studies: No results found.  Labs: Basic Metabolic Panel: Recent Labs  Lab 10/11/20 0440  NA 136  K 4.0  CL 107  CO2 19*  GLUCOSE 87  BUN 9  CREATININE 0.84  CALCIUM 8.2*  MG 2.0   Liver Function Tests: Recent Labs  Lab 10/11/20 0440  AST 20  ALT 24  ALKPHOS 50  BILITOT 0.8  PROT 6.3*  ALBUMIN 3.7    CBC: Recent Labs  Lab 10/10/20 1439 10/11/20 0440  WBC  --  11.2*  NEUTROABS  --  8.9*  HGB 16.7 13.9  HCT 49.8 41.8  MCV  --  92.1  PLT  --  178    CBG: No results for input(s): GLUCAP in the last 168 hours.  Active Problems:   Morbid obesity ([place])   Signed:  Clovis Riley, [facility], [place] 10/11/2020, 3:46 PM

## 2020-10-11 NOTE — Progress Notes (Signed)
D/C instructions given to patient. Patient had no questions. NT or writer will wheel patient out once his ride is here  

## 2020-10-11 NOTE — Progress Notes (Signed)
Patient alert and oriented, Post op day 1.  Provided support and encouragement.  Encouraged pulmonary toilet, ambulation and small sips of liquids.  All questions answered.  Will continue to monitor. 

## 2020-10-11 NOTE — Progress Notes (Signed)
Patient alert and oriented, pain is controlled. Patient is tolerating fluids, advanced to protein shake today, patient is tolerating well.  Reviewed Gastric sleeve discharge instructions with patient and patient is able to articulate understanding.  Provided information on BELT program, Support Group and [facility] outpatient pharmacy. All questions answered, will continue to monitor.  24hr fluid recall: .  Per dehydration protocol, will call pt to f/u within one week post op.

## 2020-10-11 NOTE — Discharge Instructions (Signed)
GASTRIC BYPASS / SLEEVE  Home Care Instructions  These instructions are to help you care for yourself when you go home.  Call: If you have any problems. . Call [phone] and ask for the surgeon on call . If you have an emergency related to your surgery please use the ER at [place].  . Tell the ER staff that you are a new post-op gastric bypass or gastric sleeve patient   Signs and symptoms to report: . Severe vomiting or nausea o If you cannot handle clear liquids for longer than 1 day, call your surgeon  . Abdominal pain which does not get better after taking your pain medication . Fever greater than 100.4 F and chills . Heart rate over 100 beats a minute . Trouble breathing . Chest pain .  Redness, swelling, drainage, or foul odor at incision (surgical) sites .  If your incisions open or pull apart . Swelling or pain in calf (lower leg) . Diarrhea (Loose bowel movements that happen often), frequent watery, uncontrolled bowel movements . Constipation, (no bowel movements for 3 days) if this happens:  o Take Milk of Magnesia, 2 tablespoons by mouth, 3 times a day for 2 days if needed o Stop taking Milk of Magnesia once you have had a bowel movement o Call your doctor if constipation continues Or o Take Miralax  (instead of Milk of Magnesia) following the label instructions o Stop taking Miralax once you have had a bowel movement o Call your doctor if constipation continues . Anything you think is "abnormal for you"   Normal side effects after surgery: . Unable to sleep at night or unable to concentrate . Irritability . Being tearful (crying) or depressed These are common complaints, possibly related to your anesthesia, stress of surgery and change in lifestyle, that usually go away a few weeks after surgery.  If these feelings continue, call your medical doctor.  Wound Care: You may have surgical glue, steri-strips, or staples over your incisions after surgery . Surgical  glue:  Looks like a clear film over your incisions and will wear off a little at a time . Steri-strips : Adhesive strips of tape over your incisions. You may notice a yellowish color on the skin under the steri-strips. This is used to make the   steri-strips stick better. Do not pull the steri-strips off - let them fall off . Staples: Staples may be removed before you leave the hospital o If you go home with staples, call [facility] at for an appointment with your surgeon's nurse to have staples removed 10 days after surgery, [phone] . Showering: You may shower two (2) days after your surgery unless your surgeon tells you differently o Wash gently around incisions with warm soapy water, rinse well, and gently pat dry  o If you have a drain (tube from your incision), you may need someone to hold this while you shower  o No tub baths until staples are removed and incisions are healed     Medications: . Medications should be liquid or crushed if larger than the size of a dime . Extended release pills (medication that releases a little bit at a time through the day) should not be crushed . Depending on the size and number of medications you take, you may need to space (take a few throughout the day)/change the time you take your medications so that you do not over-fill your pouch (smaller stomach) . Make sure you follow-up with   your primary care physician to make medication changes needed during rapid weight loss and life-style changes . If you have diabetes, follow up with the doctor that orders your diabetes medication(s) within one week after surgery and check your blood sugar regularly. . Do not drive while taking narcotics (pain medications) . DO NOT take NSAID'S (Examples of NSAID's include ibuprofen, naproxen)  Diet:                    First 2 Weeks  You will see the nutritionist about two (2) weeks after your surgery. The nutritionist will increase the types of foods you can  eat if you are handling liquids well: . If you have severe vomiting or nausea and cannot handle clear liquids lasting longer than 1 day, call your surgeon  Protein Shake . Drink at least 2 ounces of shake 5-6 times per day . Each serving of protein shakes (usually 8 - 12 ounces) should have a minimum of:  o 15 grams of protein  o And no more than 5 grams of carbohydrate  . Goal for protein each day: o Men = 80 grams per day o Women = 60 grams per day . Protein powder may be added to fluids such as non-fat milk or Lactaid milk or Soy milk (limit to 35 grams added protein powder per serving)  Hydration . Slowly increase the amount of water and other clear liquids as tolerated (See Acceptable Fluids) . Slowly increase the amount of protein shake as tolerated  .  Sip fluids slowly and throughout the day . May use sugar substitutes in small amounts (no more than 6 - 8 packets per day; i.e. Splenda)  Fluid Goal . The first goal is to drink at least 8 ounces of protein shake/drink per day (or as directed by the nutritionist);  See handout from pre-op Bariatric Education Class for examples of protein shake/drink.   o Slowly increase the amount of protein shake you drink as tolerated o You may find it easier to slowly sip shakes throughout the day o It is important to get your proteins in first . Your fluid goal is to drink 64 - 100 ounces of fluid daily o It may take a few weeks to build up to this . 32 oz (or more) should be clear liquids  And  . 32 oz (or more) should be full liquids (see below for examples) . Liquids should not contain sugar, caffeine, or carbonation  Clear Liquids: . Water or Sugar-free flavored water (i.e. Fruit H2O, Propel) . Decaffeinated coffee or tea (sugar-free) . Chastin Garlitz Lite, Wyler's Lite, Minute Maid Lite . Sugar-free Jell-O . Bouillon or broth . Sugar-free Popsicle:   *Less than 20 calories each; Limit 1 per day  Full Liquids: Protein Shakes/Drinks + 2  choices per day of other full liquids . Full liquids must be: o No More Than 12 grams of Carbs per serving  o No More Than 3 grams of Fat per serving . Strained low-fat cream soup . Non-Fat milk . Fat-free Lactaid Milk . Sugar-free yogurt (Dannon Lite & Fit, Greek yogurt)      Vitamins and Minerals . Start 1 day after surgery unless otherwise directed by your surgeon . Bariatric Specific Complete Multivitamins . Chewable Calcium Citrate with Vitamin D-3 (Example: 3 Chewable Calcium Plus 600 with Vitamin D-3) o Take 500 mg three (3) times a day for a total of 1500 mg each day o Do not take all 3 doses   of calcium at one time as it may cause constipation, and you can only absorb 500 mg  at a time  o Do not mix multivitamins containing iron with calcium supplements; take 2 hours apart  . Menstruating women and those at risk for anemia (a blood disease that causes weakness) may need extra iron o Talk with your doctor to see if you need more iron . If you need extra iron: Total daily Iron recommendation (including Vitamins) is 50 to 100 mg Iron/day . Do not stop taking or change any vitamins or minerals until you talk to your nutritionist or surgeon . Your nutritionist and/or surgeon must approve all vitamin and mineral supplements   Activity and Exercise: It is important to continue walking at home.  Limit your physical activity as instructed by your doctor.  During this time, use these guidelines: . Do not lift anything greater than ten (10) pounds for at least two (2) weeks . Do not go back to work or drive until your surgeon says you can . You may have sex when you feel comfortable  o It is VERY important for male patients to use a reliable birth control method; fertility often increases after surgery  o Do not get pregnant for at least 18 months . Start exercising as soon as your doctor tells you that you can o Make sure your doctor approves any physical activity . Start with a  simple walking program . Walk 5-15 minutes each day, 7 days per week.  . Slowly increase until you are walking 30-45 minutes per day Consider joining our BELT program. [phone] or email [email or web address]   Special Instructions Things to remember:  . Use your CPAP when sleeping if this applies to you, do not stop the use of CPAP unless directed by physician after a sleep study . [facility] has a free Bariatric Surgery Support Group that meets monthly, the 3rd Thursday, 6 pm.  Please review discharge information for date and location of this meeting. . It is very important to keep all follow up appointments with your surgeon, nutritionist, primary care physician, and behavioral health practitioner o After the first year, please follow up with your bariatric surgeon and nutritionist at least once a year in order to maintain best weight loss results   [facility]: [phone] [facility]: [phone] Bariatric Nurse Coordinator: [phone]      

## 2020-10-11 NOTE — Progress Notes (Signed)
S: No acute events.  Tolerating liquids with no nausea or reflux.  Does note intermittent shooting pain in the epigastrium.  Feels that the left subcostal port site appears puffy when he stands up.  Has been walking in the halls and using his incentive spirometer.  O: Vitals, labs, intake/output, and orders reviewed at this time.  Afebrile, no tachycardia, normotensive, 98% on room air. 870 PO, urine output has been recorded as 4 voids.  CMP unremarkable.  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   Phylliss Blakes, MD [facility], [place]

## 2020-10-11 NOTE — Progress Notes (Signed)
Nutrition Education Note  Received consult for diet education for patient s/p bariatric surgery. Patient is POD #1 sleeve gastrectomy.   Discussed 2 week post op diet with pt. Emphasized that liquids must be non carbonated, non caffeinated, and sugar free. Fluid goals discussed. Pt to follow up with outpatient bariatric RD for further diet progression after 2 weeks. Multivitamins and minerals also reviewed. Teach back method used, pt expressed understanding, expect good compliance.  Patient reports pain and nausea with sips of water and protein shake this AM. He has been waiting 15 minutes between each pill he needs to take in order to not exacerbate these symptoms.   Patient attended group class pre-op. He and his wife, who is at bedside, deny any questions or concerns at this time.   If nutrition issues arise, please consult RD.      Trenton Gammon, MS, RD, LDN, CNSC Inpatient Clinical Dietitian RD pager # available in [facility]  After hours/weekend pager # available in [facility]

## 2020-10-12 ENCOUNTER — Other Ambulatory Visit (HOSPITAL_COMMUNITY): Payer: Self-pay

## 2020-10-17 DIAGNOSIS — R109 Unspecified abdominal pain: Secondary | ICD-10-CM | POA: Diagnosis not present

## 2020-10-17 DIAGNOSIS — Z20822 Contact with and (suspected) exposure to covid-19: Secondary | ICD-10-CM | POA: Diagnosis not present

## 2020-10-17 DIAGNOSIS — Z79899 Other long term (current) drug therapy: Secondary | ICD-10-CM | POA: Diagnosis not present

## 2020-10-17 DIAGNOSIS — N390 Urinary tract infection, site not specified: Secondary | ICD-10-CM | POA: Diagnosis not present

## 2020-10-17 DIAGNOSIS — R1012 Left upper quadrant pain: Secondary | ICD-10-CM | POA: Diagnosis not present

## 2020-10-17 DIAGNOSIS — R1013 Epigastric pain: Secondary | ICD-10-CM | POA: Diagnosis not present

## 2020-10-17 DIAGNOSIS — K5790 Diverticulosis of intestine, part unspecified, without perforation or abscess without bleeding: Secondary | ICD-10-CM | POA: Diagnosis not present

## 2020-10-17 DIAGNOSIS — R509 Fever, unspecified: Secondary | ICD-10-CM | POA: Diagnosis not present

## 2020-10-17 DIAGNOSIS — Z9884 Bariatric surgery status: Secondary | ICD-10-CM | POA: Diagnosis not present

## 2020-10-17 DIAGNOSIS — K5730 Diverticulosis of large intestine without perforation or abscess without bleeding: Secondary | ICD-10-CM | POA: Diagnosis not present

## 2020-10-17 DIAGNOSIS — E860 Dehydration: Secondary | ICD-10-CM | POA: Diagnosis not present

## 2020-10-17 DIAGNOSIS — Z9049 Acquired absence of other specified parts of digestive tract: Secondary | ICD-10-CM | POA: Diagnosis not present

## 2020-10-19 ENCOUNTER — Telehealth (HOSPITAL_COMMUNITY): Payer: Self-pay | Admitting: *Deleted

## 2020-10-19 NOTE — Telephone Encounter (Signed)
1.  Tell me about your pain and pain management? Pt states that Sunday night, he began to experience abdominal pain and bloating that radiated to his back along with chills.  Pt went to his local ER and was diagnosed with a UTI and prescribed antibiotics.  Pt states that Dr. Fredricka Bonine is aware.  Pt denies any pain today.     2.  Let's talk about fluid intake.  How much total fluid are you taking in? Pt states that he is getting in at least 64oz of fluid including some protein yogurt and bottled water   3.  How much protein have you taken in the last 2 days? Pt states he is meeting working to meet his goal of 80g of protein each day with the protein yogurt.  Pt states that his protein shake makes him nauseated and he has vomited x 1.  Pt states that he only consumed 26g of protein yesterday with yogurt. Pt instructed to go to store [facility] or come back to [facility] outpt pharmacy) to purchase non-flavored protein to add to fluid options to ensure that he is meeting his protein goal daily.   4.  Have you had nausea?  Tell me about when have experienced nausea and what you did to help? Pt denies nausea today.   5.  Has the frequency or color changed with your urine? Pt states that he is urinating "fine" with no changes in frequency or urgency.     6.  Tell me what your incisions look like? "Incisions look fine". Pt denies a fever, chills.  Pt states incisions are not swollen, open, or draining, "just itching really bad". Pt encouraged not to scratch at sites.  Pt instructed to call [facility] if incisions change.   7.  Have you been passing gas? BM? Pt states that he is having BMs. Last BM 10/19/20     8.  If a problem or question were to arise who would you call?  Do you know contact numbers for [facility], [facility], and [facility]? Pt denies dehydration symptoms.  Pt can describe s/sx of dehydration.  Pt knows to call [facility] for surgical, [facility] for nutrition, and [facility] for non-urgent questions or concerns.   9.  How has the walking  going? Pt states he is walking around and able to be active without difficulty.   10.  How are your vitamins and calcium going?  How are you taking them? Pt states that he is taking his/her supplements and vitamins without difficulty.  Reminded patient that the first 30 days post-operatively are important for successful recovery.  Practice good hand hygiene, wearing a mask when appropriate (since optional in most places), and minimizing exposure to people who live outside of the home, especially if they are exhibiting any respiratory, GI, or illness-like symptoms.

## 2020-10-20 DIAGNOSIS — E291 Testicular hypofunction: Secondary | ICD-10-CM | POA: Diagnosis not present

## 2020-10-25 ENCOUNTER — Encounter: Payer: BC Managed Care – PPO | Attending: Surgery | Admitting: Skilled Nursing Facility1

## 2020-10-25 ENCOUNTER — Other Ambulatory Visit: Payer: Self-pay

## 2020-10-27 NOTE — Progress Notes (Signed)
2 Week Post-Operative Nutrition Class   Patient was seen on 10/25/2020 for Post-Operative Nutrition education at [facility].    Surgery date: 10/10/2020 Surgery type: sleeve [facility] weight at [facility]: 304.3 pounds Weight today: 279  Pt states he has been drinking regular gatorade and orange juice: Dietitian advised pt to avoid sugary beverages inclusive of gatorade and juice  Pt states he went to the ED due to having shivers, lightheaded, and pain radiating to his back. Pt states he feared his stomach had "popped". Dietitian advised pt to call the surgeons office first: pt states he did not want to bother them because it was the middle fo the night: Dietitian advised there is a Psychologist, sport and exercise on call, they are working anyway.    The following the learning objectives were met by the patient during this course:  Identifies Phase 3 (Soft, High Proteins) Dietary Goals and will begin from 2 weeks post-operatively to 2 months post-operatively  Identifies appropriate sources of fluids and proteins   Identifies appropriate fat sources and healthy verses unhealthy fat types    States protein recommendations and appropriate sources post-operatively  Identifies the need for appropriate texture modifications, mastication, and bite sizes when consuming solids  Identifies appropriate multivitamin and calcium sources post-operatively  Describes the need for physical activity post-operatively and will follow MD recommendations  States when to call healthcare provider regarding medication questions or post-operative complications   Handouts given during class include:  Phase 3A: Soft, High Protein Diet Handout  Phase 3 High Protein Meals  Healthy Fats   Follow-Up Plan: Patient will follow-up at [facility] in 6 weeks for 2 month post-op nutrition visit for diet advancement per MD.

## 2020-10-31 ENCOUNTER — Telehealth: Payer: Self-pay | Admitting: Skilled Nursing Facility1

## 2020-10-31 NOTE — Telephone Encounter (Signed)
RD called pt to verify fluid intake once starting soft, solid proteins 2 week post-bariatric surgery.   Daily Fluid intake:  Daily Protein intake: Bowel Habits:   Concerns/issues:    LVM 

## 2020-11-07 ENCOUNTER — Other Ambulatory Visit: Payer: Self-pay

## 2020-11-07 ENCOUNTER — Ambulatory Visit: Payer: BC Managed Care – PPO | Admitting: Family

## 2020-11-07 ENCOUNTER — Encounter: Payer: Self-pay | Admitting: Family

## 2020-11-07 DIAGNOSIS — I10 Essential (primary) hypertension: Secondary | ICD-10-CM

## 2020-11-07 DIAGNOSIS — Z6838 Body mass index (BMI) 38.0-38.9, adult: Secondary | ICD-10-CM

## 2020-11-07 DIAGNOSIS — F909 Attention-deficit hyperactivity disorder, unspecified type: Secondary | ICD-10-CM | POA: Diagnosis not present

## 2020-11-07 DIAGNOSIS — E291 Testicular hypofunction: Secondary | ICD-10-CM | POA: Diagnosis not present

## 2020-11-07 MED ORDER — LISDEXAMFETAMINE DIMESYLATE 50 MG PO CAPS: 50.0000 mg | ORAL_CAPSULE | Freq: Every day | ORAL | 0 refills | Status: DC

## 2020-11-07 NOTE — Assessment & Plan Note (Signed)
S/p gastric sleeve.  Commended him on his weight loss.

## 2020-11-07 NOTE — Assessment & Plan Note (Signed)
Stable on vyvanse 50mg . Continue same.

## 2020-11-07 NOTE — Patient Instructions (Signed)
Please continue to check your blood pressure regularly at home.  Call me if you develop blood pressures >140/90 at home.

## 2020-11-07 NOTE — Assessment & Plan Note (Addendum)
BP Readings from Last 3 Encounters:  11/07/20 (!) 147/86  10/11/20 127/69  10/03/20 (!) 151/82   BP mildly elevated today, but states home bp readings have been normal. Pt is advised as follows:  Ok to continue off of bp meds.  Check bp 3-4 times weekly at home. Call if home bp readings >140/90.

## 2020-11-07 NOTE — Progress Notes (Signed)
Subjective:   By signing my name below, I, [name], attest that this documentation has been prepared under the direction and in the presence of Sandford Craze NP. 11/07/2020      Patient ID: John Faulkner, male    DOB: April 28, 1986, 35 y.o.   MRN: [id number]  No chief complaint on file.   HPI Patient is in today for a office visit.  He had recently had a gastric sleeve procedure. He went to the emergency room a week after his surgery due to dehydration. He is doing well know and his issues has since resolved.  Diet- He is on a protein only diet since his gastric sleeve procedure. He has issues adjusting to his new diet but has completed it well so far. He is requesting to take Vivance again to manage his appetite. Anxiety/Depression- He is doing well on 10 mg lexapro daily PO. Hypertension- His has stopped taking 10 mg amlodipine daily PO since his recent surgery. His other physician has stopped his lisinopril prescription.   Past Medical History:  Diagnosis Date  . Acute calculous cholecystitis 08/13/2011  . Anxiety   . Depression   . Elevated blood pressure   . Hypertension   . Nephrotic syndrome 1989  . Sleep apnea     Past Surgical History:  Procedure Laterality Date  . CHOLECYSTECTOMY  08/13/2011   Procedure: LAPAROSCOPIC CHOLECYSTECTOMY WITH INTRAOPERATIVE CHOLANGIOGRAM;  Surgeon: Rulon Abide, DO;  Location: [facility] OR;  Service: General;  Laterality: N/A;     . LAPAROSCOPIC GASTRIC SLEEVE RESECTION N/A 10/10/2020   Procedure: LAPAROSCOPIC GASTRIC SLEEVE RESECTION;  Surgeon: Berna Bue, MD;  Location: [facility] ORS;  Service: General;  Laterality: N/A;  . TONSILLECTOMY  2001  . UPPER GI ENDOSCOPY N/A 10/10/2020   Procedure: UPPER GI ENDOSCOPY;  Surgeon: Berna Bue, MD;  Location: [facility] ORS;  Service: General;  Laterality: N/A;    Family History  Problem Relation Age of Onset  . COPD Father   . Diabetes Father   . Heart disease Father 64  . Stroke  Father   . Diabetes Maternal Grandfather   . Diabetes Paternal Grandfather     Social History   Socioeconomic History  . Marital status: Married    Spouse name: Not on file  . Number of children: Not on file  . Years of education: Not on file  . Highest education level: Not on file  Occupational History  . Not on file  Tobacco Use  . Smoking status: Never Smoker  . Smokeless tobacco: Never Used  Vaping Use  . Vaping Use: Never used  Substance and Sexual Activity  . Alcohol use: No  . Drug use: No  . Sexual activity: Not on file  Other Topics Concern  . Not on file  Social History Narrative   Work or School:       Home Situation: lives with wife, 2 children   Daughter 2014   Son 2018      Has a Medical laboratory scientific officer a Nurse, mental health      Spiritual Beliefs: none      Lifestyle: no regular exercise; poor      Works for a Estate agent- runs a truck route      Social Determinants of Corporate investment banker Strain: Not on [employer] Insecurity: Not on file  Transportation Needs: Not on file  Physical Activity: Not on file  Stress: Not on file  Social Connections: Not on file  Intimate Partner Violence: Not on file    Outpatient Medications Prior to Visit  Medication Sig Dispense Refill  . amLODipine (NORVASC) 10 MG tablet Take 0.5 tablets (5 mg total) by mouth daily. Monitor your blood pressure daily-blood pressure can change rapidly after bariatric surgery and you may need to quickly titrate this medication.  Contact your primary care doctor regarding further titration of your blood pressure medications 30 tablet 0  . cetirizine (ZYRTEC) 10 MG tablet Take 10 mg by mouth daily.    Marland Kitchen docusate sodium (COLACE) 100 MG capsule Take 1 capsule (100 mg total) by mouth 2 (two) times daily. 10 capsule 0  . escitalopram (LEXAPRO) 10 MG tablet Take 1 tablet (10 mg total) by mouth daily. 90 tablet 1  . gabapentin (NEURONTIN) 100 MG capsule Take 2 capsules (200 mg total) by mouth every 12  (twelve) hours. 20 capsule 0  . lisdexamfetamine (VYVANSE) 50 MG capsule Take 1 capsule (50 mg total) by mouth daily. 30 capsule 0  . ondansetron (ZOFRAN-ODT) 4 MG disintegrating tablet Take 1 tablet (4 mg total) by mouth every 6 (six) hours as needed for nausea or vomiting. 20 tablet 0  . pantoprazole (PROTONIX) 40 MG tablet Take 1 tablet (40 mg total) by mouth daily. 90 tablet 0  . testosterone cypionate (DEPOTESTOSTERONE CYPIONATE) 200 MG/ML injection Inject 200 mg into the muscle every 14 (fourteen) days.    . traMADol (ULTRAM) 50 MG tablet Take 1 tablet (50 mg total) by mouth every 6 (six) hours as needed (pain). 10 tablet 0   No facility-administered medications prior to visit.    No Known Allergies  ROS     Objective:    Physical Exam Constitutional:      Appearance: Normal appearance.  HENT:     Head: Normocephalic and atraumatic.     Right Ear: External ear normal.     Left Ear: External ear normal.  Eyes:     Extraocular Movements: Extraocular movements intact.     Pupils: Pupils are equal, round, and reactive to light.  Cardiovascular:     Rate and Rhythm: Normal rate and regular rhythm.     Pulses: Normal pulses.     Heart sounds: Normal heart sounds. No murmur heard. No friction rub. No gallop.   Pulmonary:     Effort: Pulmonary effort is normal. No respiratory distress.     Breath sounds: Normal breath sounds. No stridor. No wheezing, rhonchi or rales.  Abdominal:     General: Bowel sounds are normal. There is no distension.     Tenderness: There is no abdominal tenderness. There is no guarding or rebound.     Hernia: No hernia is present.     Comments: + scar tissue noted beneath lap incision LUQ  Skin:    General: Skin is warm and dry.  Neurological:     Mental Status: He is alert and oriented to person, place, and time.  Psychiatric:        Behavior: Behavior normal.     There were no vitals taken for this visit. Wt Readings from Last 3 Encounters:   10/27/20 279 lb (126.6 kg)  10/10/20 296 lb (134.3 kg)  09/19/20 (!) 315 lb 4.8 oz (143 kg)    Diabetic Foot Exam - Simple   No data filed    Lab Results  Component Value Date   WBC 11.2 (H) 10/11/2020   HGB 13.9 10/11/2020   HCT 41.8 10/11/2020   PLT 178 10/11/2020  GLUCOSE 87 10/11/2020   CHOL 180 12/07/2019   TRIG 108.0 12/07/2019   HDL 45.30 12/07/2019   LDLCALC 113 (H) 12/07/2019   ALT 24 10/11/2020   AST 20 10/11/2020   NA 136 10/11/2020   K 4.0 10/11/2020   CL 107 10/11/2020   CREATININE 0.84 10/11/2020   BUN 9 10/11/2020   CO2 19 (L) 10/11/2020   TSH 1.74 12/07/2019   HGBA1C 5.4 05/05/2018    Lab Results  Component Value Date   TSH 1.74 12/07/2019   Lab Results  Component Value Date   WBC 11.2 (H) 10/11/2020   HGB 13.9 10/11/2020   HCT 41.8 10/11/2020   MCV 92.1 10/11/2020   PLT 178 10/11/2020   Lab Results  Component Value Date   NA 136 10/11/2020   K 4.0 10/11/2020   CO2 19 (L) 10/11/2020   GLUCOSE 87 10/11/2020   BUN 9 10/11/2020   CREATININE 0.84 10/11/2020   BILITOT 0.8 10/11/2020   ALKPHOS 50 10/11/2020   AST 20 10/11/2020   ALT 24 10/11/2020   PROT 6.3 (L) 10/11/2020   ALBUMIN 3.7 10/11/2020   CALCIUM 8.2 (L) 10/11/2020   ANIONGAP 10 10/11/2020   GFR 114.13 08/08/2020   Lab Results  Component Value Date   CHOL 180 12/07/2019   Lab Results  Component Value Date   HDL 45.30 12/07/2019   Lab Results  Component Value Date   LDLCALC 113 (H) 12/07/2019   Lab Results  Component Value Date   TRIG 108.0 12/07/2019   Lab Results  Component Value Date   CHOLHDL 4 12/07/2019   Lab Results  Component Value Date   HGBA1C 5.4 05/05/2018       Assessment & Plan:   Problem List Items Addressed This Visit   None      No orders of the defined types were placed in this encounter.   I, Sandford Craze NP, personally preformed the services described in this documentation.  All medical record entries made by the scribe  were at my direction and in my presence.  I have reviewed the chart and discharge instructions (if applicable) and agree that the record reflects my personal performance and is accurate and complete. 11/07/2020   I,[name],acting as a Neurosurgeon for Lemont Fillers, NP.,have documented all relevant documentation on the behalf of Lemont Fillers, NP,as directed by  Lemont Fillers, NP while in the presence of Lemont Fillers, NP.   Shehryar [employer]

## 2020-11-07 NOTE — Assessment & Plan Note (Signed)
Wt Readings from Last 3 Encounters:  11/07/20 273 lb (123.8 kg)  10/27/20 279 lb (126.6 kg)  10/10/20 296 lb (134.3 kg)

## 2020-11-18 ENCOUNTER — Other Ambulatory Visit: Payer: Self-pay | Admitting: Family

## 2020-11-24 DIAGNOSIS — E291 Testicular hypofunction: Secondary | ICD-10-CM | POA: Diagnosis not present

## 2020-12-07 ENCOUNTER — Other Ambulatory Visit: Payer: Self-pay

## 2020-12-07 ENCOUNTER — Encounter: Payer: BC Managed Care – PPO | Attending: Surgery | Admitting: Skilled Nursing Facility1

## 2020-12-07 DIAGNOSIS — E291 Testicular hypofunction: Secondary | ICD-10-CM | POA: Diagnosis not present

## 2020-12-07 NOTE — Progress Notes (Signed)
Bariatric Nutrition Follow-Up Visit Medical Nutrition Therapy   NUTRITION ASSESSMENT    Anthropometrics  Surgery date: 10/10/2020 Surgery type: sleeve Start weight at [facility]: 304.3 pounds Weight today: 256.1  Body Composition Scale 12/07/2020  Weight  lbs 256.1  Total Body Fat  % 28.6     Visceral Fat 17  Fat-Free Mass  % 71.3     Total Body Water  % 52.3     Muscle-Mass  lbs 48.2  BMI 34  Body Fat Displacement ---        Torso  lbs 45.4        Left Leg  lbs 9        Right Leg  lbs 9        Left Arm  lbs 4.5        Right Arm  lbs 4.5    Clinical  Medical hx: HTN Medications: see list  Labs:  Notable signs/symptoms: N/A Any previous deficiencies? Vitamin D   Lifestyle & Dietary Hx  Pt states he gets really full really fast with sold foods stating tuna is not as bad and extremely moist foods go well but also states he does not weigh or measure his foods. Pt states the biggest issue is with chicken. Pt states chicken salad goes well. Pt states he is just really frustrated with all the effort it takes to eat.  Pt states he is excited he is now fine with smaller portions and not wanting to gorge on foods.  Pt states he is a bored eater and states that has snuck up on him some.  Pt states he has already added in vegetables.  Pt states he skips lunch due to wanting to leave work earlier and lose more weight.  Pt states he does want more energy.   Estimated daily fluid intake: unknown oz Estimated daily protein intake: unknown g Supplements: multi and calcium  Current average weekly physical activity: 3 days a week 30 minutes with trainer   24-Hr Dietary Recall First Meal: trailmix Snack:   Second Meal: skipped or tuna Snack:   Third Meal: chicken salad Snack:  Beverages: diet coke, gatorade zero, gatorade g2, orange juice, water  Post-Op Goals/ Signs/ Symptoms Using straws: no Drinking while eating: no Chewing/swallowing difficulties: no Changes in vision:  no Changes to mood/headaches: no Hair loss/changes to skin/nails: no Difficulty focusing/concentrating: no Sweating: no Dizziness/lightheadedness: no Palpitations: no  Carbonated/caffeinated beverages: no N/V/D/C/Gas: no Abdominal pain: no Dumping syndrome: no    NUTRITION DIAGNOSIS  Overweight/obesity ([place]-3.3) related to past poor dietary habits and physical inactivity as evidenced by completed bariatric surgery and following dietary guidelines for continued weight loss and healthy nutrition status.     NUTRITION INTERVENTION Nutrition counseling (C-1) and education (E-2) to facilitate bariatric surgery goals, including: Diet advancement to the next phase (phase 4) now including non starchy vegetables  The importance of consuming adequate calories as well as certain nutrients daily due to the body's need for essential vitamins, minerals, and fats The importance of daily physical activity and to reach a goal of at least 150 minutes of moderate to vigorous physical activity weekly (or as directed by their physician) due to benefits such as increased musculature and improved lab values The importance of intuitive eating specifically learning hunger-satiety cues and understanding the importance of learning a new body: The importance of mindful eating to avoid grazing behaviors  The importance of eating with a focus on health not weight   Handouts Provided Include  Phase 4  Learning Style & Readiness for Change Teaching method utilized: Visual & Auditory  Demonstrated degree of understanding via: Teach Back  Readiness Level: action Barriers to learning/adherence to lifestyle change: none identified/weight focus  RD's Notes for Next Visit Assess adherence to pt chosen goals    MONITORING & EVALUATION Dietary intake, weekly physical activity, body weight, Next Steps Patient is to follow-up

## 2020-12-14 ENCOUNTER — Other Ambulatory Visit: Payer: Self-pay | Admitting: Family

## 2020-12-14 MED ORDER — LISDEXAMFETAMINE DIMESYLATE 50 MG PO CAPS: 50.0000 mg | ORAL_CAPSULE | Freq: Every day | ORAL | 0 refills | Status: DC

## 2020-12-14 NOTE — Telephone Encounter (Signed)
Requesting: Vyvanse 50mg  Contract: 04/29/2020 UDS: 04/29/2020 Last Visit: 11/07/20 Next Visit: 02/10/2021 Last Refill: 11/07/2020 #30 and 0RF  Please Advise

## 2020-12-23 DIAGNOSIS — E291 Testicular hypofunction: Secondary | ICD-10-CM | POA: Diagnosis not present

## 2020-12-28 DIAGNOSIS — R067 Sneezing: Secondary | ICD-10-CM | POA: Diagnosis not present

## 2020-12-28 DIAGNOSIS — J029 Acute pharyngitis, unspecified: Secondary | ICD-10-CM | POA: Diagnosis not present

## 2020-12-28 DIAGNOSIS — U071 COVID-19: Secondary | ICD-10-CM | POA: Diagnosis not present

## 2020-12-28 DIAGNOSIS — R531 Weakness: Secondary | ICD-10-CM | POA: Diagnosis not present

## 2021-01-09 DIAGNOSIS — E291 Testicular hypofunction: Secondary | ICD-10-CM | POA: Diagnosis not present

## 2021-01-16 ENCOUNTER — Encounter: Payer: BC Managed Care – PPO | Attending: Surgery | Admitting: Skilled Nursing Facility1

## 2021-01-16 ENCOUNTER — Other Ambulatory Visit: Payer: Self-pay

## 2021-01-16 DIAGNOSIS — Z6838 Body mass index (BMI) 38.0-38.9, adult: Secondary | ICD-10-CM | POA: Diagnosis not present

## 2021-01-16 NOTE — Progress Notes (Signed)
Bariatric Nutrition Follow-Up Visit Medical Nutrition Therapy   NUTRITION ASSESSMENT    Anthropometrics  Surgery date: 10/10/2020 Surgery type: sleeve Start weight at [facility]: 304.3 pounds Weight today: 236.3 pounds  Body Composition Scale 12/07/2020 01/16/2021  Weight  lbs 256.1 236.3  Total Body Fat  % 28.6 25.6     Visceral Fat 17 15  Fat-Free Mass  % 71.3 74.3     Total Body Water  % 52.3 55.3     Muscle-Mass  lbs 48.2 44.5  BMI 34 31.3  Body Fat Displacement ---         Torso  lbs 45.4 37.5        Left Leg  lbs 9 7.5        Right Leg  lbs 9 7.5        Left Arm  lbs 4.5 3.7        Right Arm  lbs 4.5 3.7    Clinical  Medical hx: HTN Medications: see list  Labs: testosterone 189 Notable signs/symptoms: N/A Any previous deficiencies? Vitamin D   Lifestyle & Dietary Hx  Pt states he skips lunch about 2 times a week. Pt states his wife does keep cut up veggies in the fridge so itll be easy to add in non starchy vegetables consistently.  Pt states his energy level still is not as good as he wants it to be.  Pt states he is still low on his fluids but feels it is getting better from last time. Pt states he does still get dizzy spells occasionally.   Estimated daily fluid intake: unknown oz Estimated daily protein intake: unknown g Supplements: multi and calcium (inconsistent) Current average weekly physical activity: 3 days a week 30 minutes with trainer   24-Hr Dietary Recall First Meal: trailmix or quest protein chips Snack:   Second Meal: protein chocolate milk (Nesquick) or pizza (just the toppings) or skipped Snack:   Third Meal: meatballs + mac n cheese Snack: quest protein chips Beverages: diet coke, gatorade zero, gatorade g2, orange juice, water  Post-Op Goals/ Signs/ Symptoms Using straws: no Drinking while eating: no Chewing/swallowing difficulties: no Changes in vision: no Changes to mood/headaches: no Hair loss/changes to skin/nails: no Difficulty  focusing/concentrating: no Sweating: no Dizziness/lightheadedness: no Palpitations: no  Carbonated/caffeinated beverages: no N/V/D/C/Gas: no Abdominal pain: no Dumping syndrome: no    NUTRITION DIAGNOSIS  Overweight/obesity ([place]-3.3) related to past poor dietary habits and physical inactivity as evidenced by completed bariatric surgery and following dietary guidelines for continued weight loss and healthy nutrition status.     NUTRITION INTERVENTION Nutrition counseling (C-1) and education (E-2) to facilitate bariatric surgery goals, including: Diet advancement to the next phase (phase 5-6) now including fruit/starchy vegetables The importance of consuming adequate calories as well as certain nutrients daily due to the body's need for essential vitamins, minerals, and fats The importance of daily physical activity and to reach a goal of at least 150 minutes of moderate to vigorous physical activity weekly (or as directed by their physician) due to benefits such as increased musculature and improved lab values The importance of intuitive eating specifically learning hunger-satiety cues and understanding the importance of learning a new body: The importance of mindful eating to avoid grazing behaviors  The importance of eating with a focus on health not weight  Purpose of hydration: Water makes up over 50% of your total body water, and is part of many organs throughout the body. Water is essential to transport digested nutrients,  regulate body temperature, rid the body of waste products, and protects joints and the spinal cord. When not properly hydrated you will begin to experience headaches, cramps and dizziness. Further dehydration can result in rapid heart rate, shock, oliguria, and may cause seizures.   https://www.merckmanuals.com/home/hormonal-and-metabolic-disordehttps://www.usgs.gov/special-topic/water-science-school/science/water-you-water-and-human-body?qt-science_center_objects=0#qt-science_center_objectsrs/water-balance/about-body-water HistoricalGrowth.gl https://www.stevens.org/ PimpTShirt.fi https://www.health.InvestmentBrowse.at  Handouts Provided Include  Phase 5-6  Learning Style & Readiness for Change Teaching method utilized: Visual & Auditory  Demonstrated degree of understanding via: Teach Back  Readiness Level: action Barriers to learning/adherence to lifestyle change: none identified  RD's Notes for Next Visit Assess adherence to pt chosen goals    MONITORING & EVALUATION Dietary intake, weekly physical activity, body weight, Next Steps Patient is to follow-up in 6 weeks

## 2021-01-18 ENCOUNTER — Telehealth: Payer: Self-pay | Admitting: Family

## 2021-01-18 MED ORDER — LISDEXAMFETAMINE DIMESYLATE 50 MG PO CAPS: 50.0000 mg | ORAL_CAPSULE | Freq: Every day | ORAL | 0 refills | Status: DC

## 2021-01-18 NOTE — Telephone Encounter (Signed)
Requesting: Vyvanse 50mg   Contract: 04/29/2020 UDS: 04/29/2020 Last Visit: 11/07/2020 Next Visit: 02/10/2021 Last Refill: 12/14/2020 #30 and 0RF  Please Advise

## 2021-01-18 NOTE — Telephone Encounter (Signed)
PDMP okay, Rx sent 

## 2021-01-19 ENCOUNTER — Telehealth: Payer: Self-pay

## 2021-01-19 DIAGNOSIS — Z125 Encounter for screening for malignant neoplasm of prostate: Secondary | ICD-10-CM | POA: Diagnosis not present

## 2021-01-19 DIAGNOSIS — R7989 Other specified abnormal findings of blood chemistry: Secondary | ICD-10-CM | POA: Diagnosis not present

## 2021-01-19 DIAGNOSIS — R5383 Other fatigue: Secondary | ICD-10-CM | POA: Diagnosis not present

## 2021-01-19 NOTE — Telephone Encounter (Signed)
PA initiated via Covermymeds; KEY: [id number].    PA approved.   [id number];Review Type:Prior Auth;Coverage Start Date:12/20/2020;Coverage End Date:01/19/2022

## 2021-01-20 DIAGNOSIS — R233 Spontaneous ecchymoses: Secondary | ICD-10-CM | POA: Diagnosis not present

## 2021-01-25 DIAGNOSIS — M310 Hypersensitivity angiitis: Secondary | ICD-10-CM | POA: Diagnosis not present

## 2021-01-26 DIAGNOSIS — R5383 Other fatigue: Secondary | ICD-10-CM | POA: Diagnosis not present

## 2021-01-26 DIAGNOSIS — F329 Major depressive disorder, single episode, unspecified: Secondary | ICD-10-CM | POA: Diagnosis not present

## 2021-01-26 DIAGNOSIS — R7989 Other specified abnormal findings of blood chemistry: Secondary | ICD-10-CM | POA: Diagnosis not present

## 2021-01-26 DIAGNOSIS — Z6832 Body mass index (BMI) 32.0-32.9, adult: Secondary | ICD-10-CM | POA: Diagnosis not present

## 2021-02-10 ENCOUNTER — Ambulatory Visit: Payer: BC Managed Care – PPO | Admitting: Family

## 2021-02-20 ENCOUNTER — Other Ambulatory Visit: Payer: Self-pay

## 2021-02-20 ENCOUNTER — Ambulatory Visit (INDEPENDENT_AMBULATORY_CARE_PROVIDER_SITE_OTHER): Payer: BC Managed Care – PPO | Admitting: Family

## 2021-02-20 VITALS — BP 141/80 | HR 78 | Temp 98.5°F | Resp 16 | Wt 221.0 lb

## 2021-02-20 DIAGNOSIS — E663 Overweight: Secondary | ICD-10-CM

## 2021-02-20 DIAGNOSIS — I10 Essential (primary) hypertension: Secondary | ICD-10-CM

## 2021-02-20 DIAGNOSIS — F418 Other specified anxiety disorders: Secondary | ICD-10-CM | POA: Insufficient documentation

## 2021-02-20 DIAGNOSIS — F909 Attention-deficit hyperactivity disorder, unspecified type: Secondary | ICD-10-CM | POA: Diagnosis not present

## 2021-02-20 DIAGNOSIS — F339 Major depressive disorder, recurrent, unspecified: Secondary | ICD-10-CM | POA: Insufficient documentation

## 2021-02-20 DIAGNOSIS — R21 Rash and other nonspecific skin eruption: Secondary | ICD-10-CM | POA: Insufficient documentation

## 2021-02-20 LAB — CBC WITH DIFFERENTIAL/PLATELET
Basophils Absolute: 0 10*3/uL (ref 0.0–0.1)
Basophils Relative: 1 % (ref 0.0–3.0)
Eosinophils Absolute: 0.2 10*3/uL (ref 0.0–0.7)
Eosinophils Relative: 5 % (ref 0.0–5.0)
HCT: 46.5 % (ref 39.0–52.0)
Hemoglobin: 15.6 g/dL (ref 13.0–17.0)
Lymphocytes Relative: 33.7 % (ref 12.0–46.0)
Lymphs Abs: 1.5 10*3/uL (ref 0.7–4.0)
MCHC: 33.6 g/dL (ref 30.0–36.0)
MCV: 89.5 fl (ref 78.0–100.0)
Monocytes Absolute: 0.4 10*3/uL (ref 0.1–1.0)
Monocytes Relative: 9.2 % (ref 3.0–12.0)
Neutro Abs: 2.3 10*3/uL (ref 1.4–7.7)
Neutrophils Relative %: 51.1 % (ref 43.0–77.0)
Platelets: 153 10*3/uL (ref 150.0–400.0)
RBC: 5.2 Mil/uL (ref 4.22–5.81)
RDW: 13.3 % (ref 11.5–15.5)
WBC: 4.5 10*3/uL (ref 4.0–10.5)

## 2021-02-20 MED ORDER — ESCITALOPRAM OXALATE 10 MG PO TABS: ORAL_TABLET | ORAL | 1 refills | Status: DC

## 2021-02-20 MED ORDER — AMPHETAMINE-DEXTROAMPHETAMINE 5 MG PO TABS: 5.0000 mg | ORAL_TABLET | Freq: Two times a day (BID) | ORAL | 0 refills | Status: DC

## 2021-02-20 NOTE — Assessment & Plan Note (Signed)
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 Improving.  Lesions are more brown and lighter in color than the lesions he shows me from original rash which where very red.  Will obtain CBC.

## 2021-02-20 NOTE — Assessment & Plan Note (Signed)
>>  ASSESSMENT AND PLAN FOR DEPRESSION WITH ANXIETY WRITTEN ON 02/20/2021  9:22 AM BY O'SULLIVAN, MELISSA, NP  Stable on Lexapro  10mg  once daily.

## 2021-02-20 NOTE — Assessment & Plan Note (Signed)
BP Readings from Last 3 Encounters:  02/20/21 (!) 141/80  11/07/20 (!) 147/86  10/11/20 127/69   Acceptable BP.  Monitor. Not currently on medication.

## 2021-02-20 NOTE — Assessment & Plan Note (Signed)
Stable on Lexapro 10mg  once daily.

## 2021-02-20 NOTE — Assessment & Plan Note (Addendum)
Wt Readings from Last 3 Encounters:  02/20/21 221 lb (100.2 kg)  01/16/21 236 lb 4.8 oz (107.2 kg)  12/07/20 256 lb 1.6 oz (116.2 kg)   Commended him on his weight loss following gastric sleeve in April.

## 2021-02-20 NOTE — Assessment & Plan Note (Addendum)
Adderall will be cheaper. Will d/c vyvanse and restart adderall. Controlled substance contract is updated today.  Obtain follow up UDS.

## 2021-02-20 NOTE — Progress Notes (Signed)
Subjective:   By signing my name below, I, John Faulkner, attest that this documentation has been prepared under the direction and in the presence of John Craze NP. 02/20/2021    Patient ID: John Faulkner, male    DOB: 1985-09-13, 35 y.o.   MRN: [id number]  Chief Complaint  Patient presents with   Hypertension    Here for follow up   ADHD    Follow up    HPI Patient is in today for a office visit.  Depression/Anxiety-He continues taking 10 mg lexapro daily PO and reports no new issues while taking it. He is also requesting a refill for it.  Weight- He has lost weight since his last visit. He is consistently losing weight since his gastric bypass surgeries and reports his appetite is stable. He reports weighing 296 lb on the day of his gastric bypass procedure.   Wt Readings from Last 3 Encounters:  02/20/21 221 lb (100.2 kg)  01/16/21 236 lb 4.8 oz (107.2 kg)  12/07/20 256 lb 1.6 oz (116.2 kg)   ADHD- He continues taking 50 mg vyvanse daily PO and is requesting to switch medications due to the cost. He has taken adderall in the past and is willing to start taking it again.  Testosterone- He notes that he stopped getting testosterone injections because his testosterone levels were decreased while taking them. He then switched to 50 mg Clomid. He continues taking 50 mg Clomid daily PO and reports no new issues while taking it.  Reflux- He stopped taking Protonix to manage his symptoms and reports no new issues.  Blood pressure- His blood pressure is doing well during this visit. He does not take any blood pressure medications.   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 He was told by his provider at this time that his recent  Covid-19 could of triggered his symptoms. He notes that he needs lab work if his symptoms worsen. He denies working outside in the heat when his symptoms presented.   Health Maintenance Due  Topic Date Due   HIV Screening  Never done   Hepatitis C Screening  Never done   INFLUENZA VACCINE  01/23/2021    Past Medical History:  Diagnosis Date   Acute calculous cholecystitis 08/13/2011   Anxiety    Depression    Elevated blood pressure    Hypertension    Nephrotic syndrome 1989   Sleep apnea     Past Surgical History:  Procedure Laterality Date   CHOLECYSTECTOMY  08/13/2011   Procedure: LAPAROSCOPIC CHOLECYSTECTOMY WITH INTRAOPERATIVE CHOLANGIOGRAM;  Surgeon: Rulon Abide, DO;  Location: [facility] OR;  Service: General;  Laterality: N/A;      LAPAROSCOPIC GASTRIC SLEEVE RESECTION N/A 10/10/2020   Procedure: LAPAROSCOPIC GASTRIC SLEEVE RESECTION;  Surgeon: Berna Bue, MD;  Location: [facility];  Service: General;  Laterality: N/A;   TONSILLECTOMY  2001   UPPER GI ENDOSCOPY N/A 10/10/2020   Procedure: UPPER GI ENDOSCOPY;  Surgeon: Berna Bue, MD;  Location: [facility];  Service: General;  Laterality: N/A;    Family History  Problem Relation Age of Onset   COPD Father    Diabetes Father    Heart disease Father 71  Stroke Father    Diabetes Maternal Grandfather    Diabetes Paternal Grandfather     Social History   Socioeconomic History   Marital status: Married    Spouse name: Not on file   Number of children: Not on file   Years of education: Not on file   Highest education level: Not on file  Occupational History   Not on file  Tobacco Use   Smoking status: Never   Smokeless tobacco: Never  Vaping Use   Vaping Use: Never used  Substance and Sexual Activity   Alcohol use: No   Drug use: No   Sexual activity: Not on file  Other Topics Concern   Not on file  Social History Narrative   Work or School:       Home Situation: lives with wife, 2 children    Daughter 2014   Son 2018      Has a cat a Nurse, mental health      Spiritual Beliefs: none      Lifestyle: no regular exercise; poor      Works for a Estate agent- runs a truck route      Social Determinants of Corporate investment banker Strain: Not on Ship broker Insecurity: Not on file  Transportation Needs: Not on file  Physical Activity: Not on file  Stress: Not on file  Social Connections: Not on file  Intimate Partner Violence: Not on file    Outpatient Medications Prior to Visit  Medication Sig Dispense Refill   clomiPHENE (CLOMID) 50 MG tablet Take by mouth.     escitalopram (LEXAPRO) 10 MG tablet TAKE 1 TABLET(10 MG) BY MOUTH DAILY 90 tablet 1   lisdexamfetamine (VYVANSE) 50 MG capsule Take 1 capsule (50 mg total) by mouth daily. 30 capsule 0   pantoprazole (PROTONIX) 40 MG tablet Take 1 tablet (40 mg total) by mouth daily. 90 tablet 0   testosterone cypionate (DEPOTESTOSTERONE CYPIONATE) 200 MG/ML injection Inject 200 mg into the muscle every 14 (fourteen) days.     No facility-administered medications prior to visit.    No Known Allergies  ROS See HPI    Objective:    Physical Exam Constitutional:      General: He is not in acute distress.    Appearance: Normal appearance. He is not ill-appearing.  HENT:     Head: Normocephalic and atraumatic.     Right Ear: External ear normal.     Left Ear: External ear normal.  Eyes:     Extraocular Movements: Extraocular movements intact.     Pupils: Pupils are equal, round, and reactive to light.  Cardiovascular:     Rate and Rhythm: Normal rate and regular rhythm.     Heart sounds: Normal heart sounds. No murmur heard.   No gallop.  Pulmonary:     Effort: Pulmonary effort is normal. No respiratory distress.     Breath sounds: Normal breath sounds. No wheezing or rales.  Skin:    General: Skin is warm and dry.     Comments: Hyperpigmented spots on bilateral lower extremities  Neurological:     Mental Status:  He is alert and oriented to person, place, and time.  Psychiatric:        Behavior: Behavior normal.    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 Improving.  Lesions are more brown and lighter in color than the lesions he shows me from original rash which where very red.  Will obtain CBC.       Overweight (BMI 25.0-29.9)    Wt Readings from Last 3 Encounters:  02/20/21 221 lb (100.2 kg)  01/16/21 236 lb 4.8 oz (107.2 kg)  12/07/20 256 lb 1.6 oz (116.2 kg)  Commended him on his weight loss following gastric sleeve in April.        Hypertension    BP Readings from Last 3 Encounters:  02/20/21 (!) 141/80  11/07/20 (!) 147/86  10/11/20 127/69  Acceptable BP.  Monitor. Not currently on medication.       Relevant Orders   CBC with Differential/Platelet   Depression with anxiety    Stable on Lexapro 10mg  once daily.       Relevant Medications   escitalopram (LEXAPRO) 10 MG tablet   ADHD (attention deficit hyperactivity disorder) - Primary    Adderall will be cheaper. Will d/c vyvanse and restart adderall. Controlled substance contract is updated today.  Obtain follow up UDS.      Relevant Orders   DRUG MONITORING, PANEL 8 WITH CONFIRMATION, URINE     Meds ordered this encounter  Medications   amphetamine-dextroamphetamine (ADDERALL) 5 MG tablet    Sig: Take 1 tablet (5 mg total) by mouth 2 (two) times daily with a meal.    Dispense:  60 tablet    Refill:  0    Order Specific Question:   Supervising Provider     Answer:   A [[id number]]   escitalopram (LEXAPRO) 10 MG tablet    Sig: TAKE 1 TABLET(10 MG) BY MOUTH DAILY    Dispense:  90 tablet    Refill:  1    Order Specific Question:   Supervising Provider    Answer:   Danise Edge A [[id number]]    I, Danise Edge NP, personally preformed the services described in this documentation.  All medical record entries made by the scribe were at my direction and in my presence.  I have reviewed the chart and discharge instructions (if applicable) and agree that the record reflects my personal performance and is accurate and complete. 02/20/2021   I,John Faulkner,acting as a 02/22/2021 for Neurosurgeon, NP.,have documented all relevant documentation on the behalf of Lemont Fillers, NP,as directed by  Lemont Fillers, NP while in the presence of Lemont Fillers, NP.   Lemont Fillers, NP

## 2021-02-22 LAB — DRUG MONITORING, PANEL 8 WITH CONFIRMATION, URINE
6 Acetylmorphine: NEGATIVE ng/mL (ref <10)
Alcohol Metabolites: NEGATIVE ng/mL (ref <500)
Amphetamine: 8347 ng/mL — ABNORMAL HIGH (ref <250)
Amphetamines: POSITIVE ng/mL — AB (ref <500)
Benzodiazepines: NEGATIVE ng/mL (ref <100)
Buprenorphine, Urine: NEGATIVE ng/mL (ref <5)
Cocaine Metabolite: NEGATIVE ng/mL (ref <150)
Creatinine: >300 mg/dL (ref >=20.0)
MDMA: NEGATIVE ng/mL (ref <500)
Marijuana Metabolite: NEGATIVE ng/mL (ref <20)
Methamphetamine: NEGATIVE ng/mL (ref <250)
Opiates: NEGATIVE ng/mL (ref <100)
Oxidant: NEGATIVE ug/mL (ref <200)
Oxycodone: NEGATIVE ng/mL (ref <100)
pH: 6.2 (ref 4.5–9.0)

## 2021-02-22 LAB — DM TEMPLATE

## 2021-03-03 DIAGNOSIS — R42 Dizziness and giddiness: Secondary | ICD-10-CM | POA: Diagnosis not present

## 2021-03-03 DIAGNOSIS — E663 Overweight: Secondary | ICD-10-CM | POA: Diagnosis not present

## 2021-03-06 ENCOUNTER — Encounter: Payer: BC Managed Care – PPO | Admitting: Skilled Nursing Facility1

## 2021-03-07 DIAGNOSIS — R42 Dizziness and giddiness: Secondary | ICD-10-CM | POA: Diagnosis not present

## 2021-03-13 DIAGNOSIS — E559 Vitamin D deficiency, unspecified: Secondary | ICD-10-CM | POA: Diagnosis not present

## 2021-03-13 DIAGNOSIS — Z125 Encounter for screening for malignant neoplasm of prostate: Secondary | ICD-10-CM | POA: Diagnosis not present

## 2021-03-13 DIAGNOSIS — R5383 Other fatigue: Secondary | ICD-10-CM | POA: Diagnosis not present

## 2021-03-13 DIAGNOSIS — E291 Testicular hypofunction: Secondary | ICD-10-CM | POA: Diagnosis not present

## 2021-03-17 DIAGNOSIS — E291 Testicular hypofunction: Secondary | ICD-10-CM | POA: Diagnosis not present

## 2021-03-17 DIAGNOSIS — F329 Major depressive disorder, single episode, unspecified: Secondary | ICD-10-CM | POA: Diagnosis not present

## 2021-03-17 DIAGNOSIS — R7989 Other specified abnormal findings of blood chemistry: Secondary | ICD-10-CM | POA: Diagnosis not present

## 2021-03-17 DIAGNOSIS — R5383 Other fatigue: Secondary | ICD-10-CM | POA: Diagnosis not present

## 2021-03-31 ENCOUNTER — Other Ambulatory Visit: Payer: Self-pay | Admitting: Family

## 2021-03-31 MED ORDER — AMPHETAMINE-DEXTROAMPHETAMINE 5 MG PO TABS: 5.0000 mg | ORAL_TABLET | Freq: Two times a day (BID) | ORAL | 0 refills | Status: DC

## 2021-03-31 NOTE — Telephone Encounter (Signed)
Requesting: Adderall 5mg  Contract: 02/20/21 UDS: 02/20/21 Last Visit: 02/20/21 Next Visit: 05/26/21 Last Refill: 02/20/21  Please Advise

## 2021-04-03 ENCOUNTER — Other Ambulatory Visit: Payer: Self-pay | Admitting: Family

## 2021-04-10 ENCOUNTER — Encounter: Payer: BC Managed Care – PPO | Attending: Surgery | Admitting: Skilled Nursing Facility1

## 2021-04-10 ENCOUNTER — Other Ambulatory Visit: Payer: Self-pay

## 2021-04-10 NOTE — Progress Notes (Signed)
Bariatric Nutrition Follow-Up Visit Medical Nutrition Therapy   NUTRITION ASSESSMENT    Anthropometrics  Surgery date: 10/10/2020 Surgery type: sleeve Start weight at [facility]: 304.3 pounds Weight today: 211.6 pounds  Body Composition Scale 12/07/2020 01/16/2021 04/10/2021  Weight  lbs 256.1 236.3 211.6  Total Body Fat  % 28.6 25.6 21.7     Visceral Fat 17 15 12   Fat-Free Mass  % 71.3 74.3 78.2     Total Body Water  % 52.3 55.3 59.2     Muscle-Mass  lbs 48.2 44.5 39.8  BMI 34 31.3 28.7  Body Fat Displacement ---          Torso  lbs 45.4 37.5 28.5        Left Leg  lbs 9 7.5 5.7        Right Leg  lbs 9 7.5 5.7        Left Arm  lbs 4.5 3.7 2.8        Right Arm  lbs 4.5 3.7 2.8    Clinical  Medical hx: HTN Medications: see list  Labs: testosterone 189 Notable signs/symptoms: N/A Any previous deficiencies? Vitamin D   Lifestyle & Dietary Hx  Pt states he is having hair thinning and was still having dizziness so had blood drawn had low vitamin D and since getting a vitamin D supplement no longer experiences dizziness.   Estimated daily fluid intake: 60-80 oz Estimated daily protein intake: unknown g Supplements: multi and calcium (inconsistent) Current average weekly physical activity: active job at , 2 times a week 30 minutes resistance, bench free weights  24-Hr Dietary Recall First Meal: slate milk (20g) 2 cheese sticks Snack:  wisp cheese + nuts + seeds Second Meal: wisp cheese + nuts + seeds or salad Snack: wisp cheese + nuts + seeds Third Meal: slice of cake or sometimes spaghettios or frozen meal Snack: quest protein chips Beverages: diet coke, gatorade zero, gatorade g2, orange juice, water, loaw sugar coconut water  Post-Op Goals/ Signs/ Symptoms Using straws: no Drinking while eating: no Chewing/swallowing difficulties: no Changes in vision: no Changes to mood/headaches: no Hair loss/changes to skin/nails: hair thinning  Difficulty  focusing/concentrating: no Sweating: no Dizziness/lightheadedness: no Palpitations: no  Carbonated/caffeinated beverages: no N/V/D/C/Gas: no Abdominal pain: no Dumping syndrome: no    NUTRITION DIAGNOSIS  Overweight/obesity ([place]-3.3) related to past poor dietary habits and physical inactivity as evidenced by completed bariatric surgery and following dietary guidelines for continued weight loss and healthy nutrition status.     NUTRITION INTERVENTION Nutrition counseling (C-1) and education (E-2) to facilitate bariatric surgery goals, including: The importance of consuming adequate calories as well as certain nutrients daily due to the body's need for essential vitamins, minerals, and fats The importance of daily physical activity and to reach a goal of at least 150 minutes of moderate to vigorous physical activity weekly (or as directed by their physician) due to benefits such as increased musculature and improved lab values The importance of intuitive eating specifically learning hunger-satiety cues and understanding the importance of learning a new body: The importance of mindful eating to avoid grazing behaviors  The importance of eating with a focus on health not weight  Purpose of hydration: Water makes up over 50% of your total body water, and is part of many organs throughout the body. Water is essential to transport digested nutrients, regulate body temperature, rid the body of waste products, and protects joints and the spinal cord. When not properly hydrated you will  begin to experience headaches, cramps and dizziness. Further dehydration can result in rapid heart rate, shock, oliguria, and may cause seizures.   https://www.merckmanuals.com/home/hormonal-and-metabolic-disordehttps://www.usgs.gov/special-topic/water-science-school/science/water-you-water-and-human-body?qt-science_center_objects=0#qt-science_center_objectsrs/water-balance/about-body-water FriendLock.it InvestmentInstructor.com.cy FurEliminator.es https://www.health.BasicFM.no Why you need complex carbohydrates: Whole grains and other complex carbohydrates are required to have a healthy diet. Whole grains provide fiber which can help with blood glucose levels and help keep you satiated. Fruits and starchy vegetables provide essential vitamins and minerals required for immune function, eyesight support, brain support, bone density, wound healing and many other functions within the body. According to the current evidenced based 2020-2025 Dietary Guidelines for Americans, complex carbohydrates are part of a healthy eating pattern which is associated with a decreased risk for type 2 diabetes, cancers, and cardiovascular disease.   Goals: -add a complex carb with breakfast such as oatmeal -add a piece of fruit as a snack in the middle of the day   Handouts Previously Provided Include  Phase 5-6  Learning Style & Readiness for Change Teaching method utilized: Visual & Auditory  Demonstrated degree of understanding via: Teach Back  Readiness Level: action Barriers to learning/adherence to lifestyle change: none identified  RD's Notes for Next Visit Assess adherence to pt chosen goals    MONITORING & EVALUATION Dietary intake, weekly physical activity, body weight, Next Steps Patient is to follow-up in April unless issues arise or continued weight loss

## 2021-04-19 DIAGNOSIS — G4733 Obstructive sleep apnea (adult) (pediatric): Secondary | ICD-10-CM | POA: Diagnosis not present

## 2021-04-21 ENCOUNTER — Other Ambulatory Visit: Payer: Self-pay | Admitting: Oral Surgery

## 2021-04-21 DIAGNOSIS — M2602 Maxillary hypoplasia: Secondary | ICD-10-CM

## 2021-04-27 DIAGNOSIS — G4733 Obstructive sleep apnea (adult) (pediatric): Secondary | ICD-10-CM | POA: Diagnosis not present

## 2021-05-02 ENCOUNTER — Other Ambulatory Visit: Payer: Self-pay | Admitting: Family

## 2021-05-03 MED ORDER — AMPHETAMINE-DEXTROAMPHETAMINE 5 MG PO TABS: 5.0000 mg | ORAL_TABLET | Freq: Two times a day (BID) | ORAL | 0 refills | Status: DC

## 2021-05-03 NOTE — Telephone Encounter (Signed)
Requesting: adderall Contract: 02/20/21 UDS:02/20/21 Last Visit:02/20/21 Next Visit:n/a Last Refill:03/31/21  Please Advise

## 2021-05-05 ENCOUNTER — Ambulatory Visit
Admission: RE | Admit: 2021-05-05 | Discharge: 2021-05-05 | Disposition: A | Discharge status: Home / Self Care | Payer: BC Managed Care – PPO | Source: Ambulatory Visit | Attending: Oral Surgery | Admitting: Oral Surgery

## 2021-05-05 ENCOUNTER — Other Ambulatory Visit: Payer: Self-pay

## 2021-05-05 DIAGNOSIS — M2602 Maxillary hypoplasia: Secondary | ICD-10-CM

## 2021-05-05 DIAGNOSIS — Z01818 Encounter for other preprocedural examination: Secondary | ICD-10-CM | POA: Diagnosis not present

## 2021-05-12 ENCOUNTER — Encounter: Payer: BC Managed Care – PPO | Admitting: Family

## 2021-05-20 DIAGNOSIS — G4733 Obstructive sleep apnea (adult) (pediatric): Secondary | ICD-10-CM | POA: Diagnosis not present

## 2021-05-26 ENCOUNTER — Encounter: Payer: BC Managed Care – PPO | Admitting: Family

## 2021-05-27 DIAGNOSIS — G4733 Obstructive sleep apnea (adult) (pediatric): Secondary | ICD-10-CM | POA: Diagnosis not present

## 2021-05-29 ENCOUNTER — Encounter: Payer: Self-pay | Admitting: Family

## 2021-05-29 ENCOUNTER — Ambulatory Visit (INDEPENDENT_AMBULATORY_CARE_PROVIDER_SITE_OTHER): Payer: BC Managed Care – PPO | Admitting: Family

## 2021-05-29 VITALS — BP 134/71 | HR 56 | Temp 97.7°F | Resp 16 | Ht 72.0 in | Wt 209.0 lb

## 2021-05-29 DIAGNOSIS — Z0000 Encounter for general adult medical examination without abnormal findings: Secondary | ICD-10-CM

## 2021-05-29 DIAGNOSIS — K6289 Other specified diseases of anus and rectum: Secondary | ICD-10-CM

## 2021-05-29 DIAGNOSIS — F909 Attention-deficit hyperactivity disorder, unspecified type: Secondary | ICD-10-CM

## 2021-05-29 DIAGNOSIS — K601 Chronic anal fissure: Secondary | ICD-10-CM

## 2021-05-29 DIAGNOSIS — I10 Essential (primary) hypertension: Secondary | ICD-10-CM | POA: Diagnosis not present

## 2021-05-29 DIAGNOSIS — R634 Abnormal weight loss: Secondary | ICD-10-CM | POA: Diagnosis not present

## 2021-05-29 HISTORY — DX: Chronic anal fissure: K60.1

## 2021-05-29 LAB — CBC WITH DIFFERENTIAL/PLATELET
Basophils Absolute: 0 10*3/uL (ref 0.0–0.1)
Basophils Relative: 0.7 % (ref 0.0–3.0)
Eosinophils Absolute: 0.1 10*3/uL (ref 0.0–0.7)
Eosinophils Relative: 1.7 % (ref 0.0–5.0)
HCT: 42 % (ref 39.0–52.0)
Hemoglobin: 14.1 g/dL (ref 13.0–17.0)
Lymphocytes Relative: 31.1 % (ref 12.0–46.0)
Lymphs Abs: 1.7 10*3/uL (ref 0.7–4.0)
MCHC: 33.5 g/dL (ref 30.0–36.0)
MCV: 93.5 fl (ref 78.0–100.0)
Monocytes Absolute: 0.6 10*3/uL (ref 0.1–1.0)
Monocytes Relative: 10.9 % (ref 3.0–12.0)
Neutro Abs: 3 10*3/uL (ref 1.4–7.7)
Neutrophils Relative %: 55.6 % (ref 43.0–77.0)
Platelets: 179 10*3/uL (ref 150.0–400.0)
RBC: 4.49 Mil/uL (ref 4.22–5.81)
RDW: 12.8 % (ref 11.5–15.5)
WBC: 5.4 10*3/uL (ref 4.0–10.5)

## 2021-05-29 LAB — LIPID PANEL
Cholesterol: 142 mg/dL (ref 0–200)
HDL: 43.2 mg/dL (ref >39.00)
LDL Cholesterol: 86 mg/dL (ref 0–99)
NonHDL: 98.8
Total CHOL/HDL Ratio: 3
Triglycerides: 63 mg/dL (ref 0.0–149.0)
VLDL: 12.6 mg/dL (ref 0.0–40.0)

## 2021-05-29 LAB — COMPREHENSIVE METABOLIC PANEL
ALT: 18 U/L (ref 0–53)
AST: 18 U/L (ref 0–37)
Albumin: 4.3 g/dL (ref 3.5–5.2)
Alkaline Phosphatase: 58 U/L (ref 39–117)
BUN: 10 mg/dL (ref 6–23)
CO2: 31 mEq/L (ref 19–32)
Calcium: 9.7 mg/dL (ref 8.4–10.5)
Chloride: 106 mEq/L (ref 96–112)
Creatinine, Ser: 0.79 mg/dL (ref 0.40–1.50)
GFR: 115.19 mL/min (ref >60.00)
Glucose, Bld: 62 mg/dL — ABNORMAL LOW (ref 70–99)
Potassium: 5.1 mEq/L (ref 3.5–5.1)
Sodium: 142 mEq/L (ref 135–145)
Total Bilirubin: 0.5 mg/dL (ref 0.2–1.2)
Total Protein: 6.5 g/dL (ref 6.0–8.3)

## 2021-05-29 LAB — TSH: TSH: 1.05 u[IU]/mL (ref 0.35–5.50)

## 2021-05-29 MED ORDER — AMPHETAMINE-DEXTROAMPHET ER 10 MG PO CP24: 10.0000 mg | ORAL_CAPSULE | Freq: Every day | ORAL | 0 refills | Status: DC

## 2021-05-29 MED ORDER — AMPHETAMINE-DEXTROAMPHETAMINE 5 MG PO TABS: ORAL_TABLET | ORAL | 0 refills | Status: DC

## 2021-05-29 NOTE — Assessment & Plan Note (Addendum)
Wt Readings from Last 3 Encounters:  05/29/21 209 lb (94.8 kg)  04/10/21 211 lb 9.6 oz (96 kg)  02/20/21 221 lb (100.2 kg)   He has lost a great deal of weight following his gastric sleeve procedure.  I commended him on this. We discussed adding 30 minutes 5 days a week of cardio. Labs as ordered. Recommended that he obtain covid bivalent booster.

## 2021-05-29 NOTE — Assessment & Plan Note (Signed)
BP Readings from Last 3 Encounters:  05/29/21 134/71  02/20/21 (!) 141/80  11/07/20 (!) 147/86   BP is much better. He is not on medication. Monitor.

## 2021-05-29 NOTE — Assessment & Plan Note (Signed)
New. Suspect fissure.  Will refer to GI. Pt is advised as follows in the meantime:  Add metamucil once daily and increase water. If you are still constipated, you may add miralax as needed.  Change adderall to 10mg  xr in the am and 5mg  in the afternoon.

## 2021-05-29 NOTE — Assessment & Plan Note (Signed)
Uncontrolled.  Will change adderall from 5mg  bid to 10mg  xr in the am and 5mg  in the PM.  Follow up in 1 month.

## 2021-05-29 NOTE — Progress Notes (Signed)
Subjective:   By signing my name below, I, John Faulkner, attest that this documentation has been prepared under the direction and in the presence of [name]. 05/29/2021     Patient ID: John Faulkner, male    DOB: 02-21-1986, 35 y.o.   MRN: [id number]  No chief complaint on file.   HPI Patient is in today for a comprehensive physical exam.   Constipation- He complains of constipation since his gastric sleeve procedure. He makes sure to include enough fiber and water into his diet. He also complains of occasional sharp rectal pain and thinks it may be an anal fissure. He is interested in seeing a GI specialist for further treatment.  Cough- He reports having a mild cough for at least an hour every morning that resolves on its own.  Adderall- He reports that his adderall feels like it is wears off in the afternoon.  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 Marland Kitchen He notes that his max weight was in 25-Sep-2017 at 350 lb's. His weight loss is slowing down but he continues losing weight.   Wt Readings from Last 3 Encounters:  05/29/21 209 lb (94.8 kg)  04/10/21 211 lb 9.6 oz (96 kg)  02/20/21 221 lb (100.2 kg)   Jaw procedure- He has an upcoming procedure for mandibular osteotomy.  CPE: He denies having any unexpected weight change, ear pain, hearing loss and rhinorrhea, visual disturbance, chest pain and leg swelling, nausea, vomiting, diarrhea, blood in stool, or dysuria and frequency, for myalgias and arthralgias, rash, headaches, adenopathy, depression or anxiety at this time. Social history- Other than his recent gastric sleeve surgery he has no other recent surgeries. He has an upcomming procedure for jaw alignment this month. He reports his mother passed away in 09/25/2020 from Covid-19 pneumonia, otherwise there are no recent changes to his family medical history. He does no use tobacco products. He does no drink alcohol.   Immunizations: He has 3 pfizer Covid-19 vaccines at this time. He is interested in receiving the bivalent Covid-19 vaccine but had recovered from Covid-19 recently in July, 2022 so he is waiting. He is UTD on flu vaccines.  Diet: Exercise: He does not participate in regular exercise at this time.  Dental: He is UTD on dental care.  Vision: He is UTD on vision care.    Health Maintenance Due  Topic Date Due   HIV Screening  Never done   Hepatitis C Screening  Never done   COVID-19 Vaccine (4 - Booster for Pfizer series) 07/25/2020    Past Medical History:  Diagnosis Date   Acute calculous cholecystitis 08/13/2011   Anxiety    Depression    Elevated blood pressure    Hypertension    Nephrotic syndrome 1989   Sleep apnea     Past Surgical History:  Procedure Laterality Date   CHOLECYSTECTOMY  08/13/2011   Procedure: LAPAROSCOPIC CHOLECYSTECTOMY WITH INTRAOPERATIVE CHOLANGIOGRAM;  Surgeon: Judieth Keens, DO;  Location: [place];  Service: General;  Laterality: N/A;      LAPAROSCOPIC GASTRIC SLEEVE RESECTION N/A 10/10/2020   Procedure: LAPAROSCOPIC GASTRIC SLEEVE RESECTION;  Surgeon: Clovis Riley, MD;  Location: [facility];  Service: General;  Laterality: N/A;   TONSILLECTOMY  09/26/99   UPPER GI ENDOSCOPY N/A 10/10/2020   Procedure: UPPER GI ENDOSCOPY;  Surgeon: Clovis Riley, MD;  Location: [facility];  Service: General;  Laterality: N/A;    Family History  Problem  Relation Age of Onset   Pneumonia Mother        covid 25 pneumonia   COPD Father    Diabetes Father    Heart disease Father 59   Stroke Father    Diabetes Maternal Grandfather    Diabetes Paternal Grandfather     Social History   Socioeconomic History   Marital status: Married    Spouse name: Not on file   Number of children: Not on file   Years of education: Not on file   Highest education level: Not on file  Occupational History   Not on file  Tobacco Use   Smoking status: Never   Smokeless tobacco:  Never  Vaping Use   Vaping Use: Never used  Substance and Sexual Activity   Alcohol use: No   Drug use: No   Sexual activity: Not on file  Other Topics Concern   Not on file  Social History Narrative   Work or School:       Home Situation: lives with wife, 2 children   Daughter 2014   Son 2018      Has a Neurosurgeon a Magazine features editor      Spiritual Beliefs: none      Lifestyle: no regular exercise; poor      Works for a Academic librarian      Social Determinants of Radio broadcast assistant Strain: Not on file  Food Insecurity: Not on file  Transportation Needs: Not on file  Physical Activity: Not on file  Stress: Not on file  Social Connections: Not on file  Intimate Partner Violence: Not on file    Outpatient Medications Prior to Visit  Medication Sig Dispense Refill   clomiPHENE (CLOMID) 50 MG tablet Take by mouth.     escitalopram (LEXAPRO) 10 MG tablet TAKE 1 TABLET(10 MG) BY MOUTH DAILY 90 tablet 1   amphetamine-dextroamphetamine (ADDERALL) 5 MG tablet Take 1 tablet (5 mg total) by mouth 2 (two) times daily with a meal. 60 tablet 0   No facility-administered medications prior to visit.    No Known Allergies  Review of Systems  Constitutional:        (-)unexpected weight change (-)Adenopathy  HENT:  Negative for hearing loss.        (-)Rhinorrhea   Eyes:        (-)Visual disturbance  Cardiovascular:  Negative for chest pain and leg swelling.  Gastrointestinal:  Positive for constipation. Negative for blood in stool, diarrhea, nausea and vomiting.       (+)pain in rectum  Genitourinary:  Negative for dysuria and frequency.  Musculoskeletal:  Negative for joint pain and myalgias.  Skin:  Negative for rash.  Neurological:  Negative for headaches.  Psychiatric/Behavioral:  Negative for depression. The patient is not nervous/anxious.       Objective:    Physical Exam Constitutional:      General: He is not in acute distress.    Appearance: Normal  appearance. He is not ill-appearing.  HENT:     Head: Normocephalic and atraumatic.     Right Ear: Tympanic membrane, ear canal and external ear normal.     Left Ear: Tympanic membrane, ear canal and external ear normal.  Eyes:     Extraocular Movements: Extraocular movements intact.     Right eye: No nystagmus.     Left eye: No nystagmus.     Pupils: Pupils are equal, round, and reactive to light.  Cardiovascular:  Rate and Rhythm: Normal rate and regular rhythm.     Heart sounds: Normal heart sounds. No murmur heard.   No gallop.  Pulmonary:     Effort: Pulmonary effort is normal. No respiratory distress.     Breath sounds: Normal breath sounds. No wheezing or rales.  Abdominal:     General: Bowel sounds are normal. There is no distension.     Palpations: Abdomen is soft.     Tenderness: There is no abdominal tenderness. There is no guarding.  Genitourinary:    Comments: Posterior tenderness during rectal exam Musculoskeletal:     Comments: 5/5 strength in both upper and lower extremities  Skin:    General: Skin is warm and dry.  Neurological:     Mental Status: He is alert and oriented to person, place, and time.     Deep Tendon Reflexes:     Reflex Scores:      Patellar reflexes are 2+ on the right side and 2+ on the left side. Psychiatric:        Behavior: Behavior normal.        Judgment: Judgment normal.    BP 134/71 (BP Location: Right Arm, Patient Position: Sitting, Cuff Size: Large)   Pulse (!) 56   Temp 97.7 F (36.5 C) (Oral)   Resp 16   Ht 6' (1.829 m)   Wt 209 lb (94.8 kg)   SpO2 100%   BMI 28.35 kg/m  Wt Readings from Last 3 Encounters:  05/29/21 209 lb (94.8 kg)  04/10/21 211 lb 9.6 oz (96 kg)  02/20/21 221 lb (100.2 kg)       Assessment & Plan:   Problem List Items Addressed This Visit       Unprioritized   Rectal pain    New. Suspect fissure.  Will refer to GI. Pt is advised as follows in the meantime:  Add metamucil once daily and  increase water. If you are still constipated, you may add miralax as needed.  Change adderall to $RemoveBef'10mg'JZIhDlXRre$  xr in the am and $Remo'5mg'qZkTh$  in the afternoon.       Relevant Orders   Ambulatory referral to Gastroenterology   Preventative health care - Primary    Wt Readings from Last 3 Encounters:  05/29/21 209 lb (94.8 kg)  04/10/21 211 lb 9.6 oz (96 kg)  02/20/21 221 lb (100.2 kg)  He has lost a great deal of weight following his gastric sleeve procedure.  I commended him on this. We discussed adding 30 minutes 5 days a week of cardio. Labs as ordered. Recommended that he obtain covid bivalent booster.       Relevant Orders   Comp Met (CMET)   CBC with Differential/Platelet   TSH   Lipid panel   Hypertension    BP Readings from Last 3 Encounters:  05/29/21 134/71  02/20/21 (!) 141/80  11/07/20 (!) 147/86  BP is much better. He is not on medication. Monitor.       ADHD (attention deficit hyperactivity disorder)    Uncontrolled.  Will change adderall from $RemoveBefor'5mg'rmihmumnisRY$  bid to $Remo'10mg'Mbxgp$  xr in the am and $Remo'5mg'kTiWV$  in the PM.  Follow up in 1 month.       Other Visit Diagnoses     Weight loss       Relevant Orders   Comp Met (CMET)   CBC with Differential/Platelet   TSH   Lipid panel        Meds ordered this encounter  Medications  DISCONTD: amphetamine-dextroamphetamine (ADDERALL XR) 10 MG 24 hr capsule    Sig: Take 1 capsule (10 mg total) by mouth daily.    Dispense:  30 capsule    Refill:  0    Order Specific Question:   Supervising Provider    Answer:   Penni Homans A [[id number]]   DISCONTD: amphetamine-dextroamphetamine (ADDERALL) 5 MG tablet    Sig: Take 1 tab by mouth once daily in the afternoon    Dispense:  30 tablet    Refill:  0    Order Specific Question:   Supervising Provider    Answer:   Penni Homans A [[id number]]   DISCONTD: amphetamine-dextroamphetamine (ADDERALL XR) 10 MG 24 hr capsule    Sig: Take 1 capsule (10 mg total) by mouth daily.    Dispense:  30 capsule    Refill:  0    Order  Specific Question:   Supervising Provider    Answer:   Penni Homans A [[id number]]   DISCONTD: amphetamine-dextroamphetamine (ADDERALL) 5 MG tablet    Sig: Take 1 tab by mouth once daily in the afternoon    Dispense:  30 tablet    Refill:  0    Order Specific Question:   Supervising Provider    Answer:   Penni Homans A [[id number]]   amphetamine-dextroamphetamine (ADDERALL) 5 MG tablet    Sig: Take 1 tab by mouth once daily in the afternoon    Dispense:  30 tablet    Refill:  0    Order Specific Question:   Supervising Provider    Answer:   Penni Homans A [[id number]]   amphetamine-dextroamphetamine (ADDERALL XR) 10 MG 24 hr capsule    Sig: Take 1 capsule (10 mg total) by mouth daily.    Dispense:  30 capsule    Refill:  0    Order Specific Question:   Supervising Provider    Answer:   Penni Homans A [[id number]]    I, [name], personally preformed the services described in this documentation.  All medical record entries made by the scribe were at my direction and in my presence.  I have reviewed the chart and discharge instructions (if applicable) and agree that the record reflects my personal performance and is accurate and complete. 05/29/2021   I,John Faulkner,acting as a Education administrator for Nance Pear, NP.,have documented all relevant documentation on the behalf of Nance Pear, NP,as directed by  Nance Pear, NP while in the presence of Nance Pear, NP.   Nance Pear, NP

## 2021-05-29 NOTE — H&P (View-Only) (Signed)
Subjective:   By signing my name below, I, [name], attest that this documentation has been prepared under the direction and in the presence of [name]. 05/29/2021     Patient ID: John Faulkner, male    DOB: [birth year], 35 y.o.   MRN: [id number]  No chief complaint on file.   HPI Patient is in today for a comprehensive physical exam.   Constipation- He complains of constipation since his gastric sleeve procedure. He makes sure to include enough fiber and water into his diet. He also complains of occasional sharp rectal pain and thinks it may be an anal fissure. He is interested in seeing a GI specialist for further treatment.  Cough- He reports having a mild cough for at least an hour every morning that resolves on its own.  Adderall- He reports that his adderall feels like it is wears off in the afternoon.  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 Marland Kitchen He notes that his max weight was in 09-06-2017 at 350 lb's. His weight loss is slowing down but he continues losing weight.   Wt Readings from Last 3 Encounters:  05/29/21 209 lb (94.8 kg)  04/10/21 211 lb 9.6 oz (96 kg)  02/20/21 221 lb (100.2 kg)   Jaw procedure- He has an upcoming procedure for mandibular osteotomy.  CPE: He denies having any unexpected weight change, ear pain, hearing loss and rhinorrhea, visual disturbance, chest pain and leg swelling, nausea, vomiting, diarrhea, blood in stool, or dysuria and frequency, for myalgias and arthralgias, rash, headaches, adenopathy, depression or anxiety at this time. Social history- Other than his recent gastric sleeve surgery he has no other recent surgeries. He has an upcomming procedure for jaw alignment this month. He reports his mother passed away in 2020-09-06 from Covid-19 pneumonia, otherwise there are no recent changes to his family medical history. He does no use tobacco products. He does no drink alcohol.   Immunizations: He has 3 pfizer Covid-19 vaccines at this time. He is interested in receiving the bivalent Covid-19 vaccine but had recovered from Covid-19 recently in July, 2022 so he is waiting. He is UTD on flu vaccines.  Diet: Exercise: He does not participate in regular exercise at this time.  Dental: He is UTD on dental care.  Vision: He is UTD on vision care.    Health Maintenance Due  Topic Date Due   HIV Screening  Never done   Hepatitis C Screening  Never done   COVID-19 Vaccine (4 - Booster for Pfizer series) 07/25/2020    Past Medical History:  Diagnosis Date   Acute calculous cholecystitis 08/13/2011   Anxiety    Depression    Elevated blood pressure    Hypertension    Nephrotic syndrome 1989   Sleep apnea     Past Surgical History:  Procedure Laterality Date   CHOLECYSTECTOMY  08/13/2011   Procedure: LAPAROSCOPIC CHOLECYSTECTOMY WITH INTRAOPERATIVE CHOLANGIOGRAM;  Surgeon: Judieth Keens, DO;  Location: [place];  Service: General;  Laterality: N/A;      LAPAROSCOPIC GASTRIC SLEEVE RESECTION N/A 10/10/2020   Procedure: LAPAROSCOPIC GASTRIC SLEEVE RESECTION;  Surgeon: Clovis Riley, MD;  Location: [facility];  Service: General;  Laterality: N/A;   TONSILLECTOMY  09-07-99   UPPER GI ENDOSCOPY N/A 10/10/2020   Procedure: UPPER GI ENDOSCOPY;  Surgeon: Clovis Riley, MD;  Location: [facility];  Service: General;  Laterality: N/A;    Family History  Problem  Relation Age of Onset   Pneumonia Mother        covid 68 pneumonia   COPD Father    Diabetes Father    Heart disease Father 32   Stroke Father    Diabetes Maternal Grandfather    Diabetes Paternal Grandfather     Social History   Socioeconomic History   Marital status: Married    Spouse name: Not on file   Number of children: Not on file   Years of education: Not on file   Highest education level: Not on file  Occupational History   Not on file  Tobacco Use   Smoking status: Never   Smokeless tobacco:  Never  Vaping Use   Vaping Use: Never used  Substance and Sexual Activity   Alcohol use: No   Drug use: No   Sexual activity: Not on file  Other Topics Concern   Not on file  Social History Narrative   Work or School:       Home Situation: lives with wife, 2 children   Daughter 2014   Son 2018      Has a Neurosurgeon a Magazine features editor      Spiritual Beliefs: none      Lifestyle: no regular exercise; poor      Works for a Academic librarian      Social Determinants of Radio broadcast assistant Strain: Not on file  Food Insecurity: Not on file  Transportation Needs: Not on file  Physical Activity: Not on file  Stress: Not on file  Social Connections: Not on file  Intimate Partner Violence: Not on file    Outpatient Medications Prior to Visit  Medication Sig Dispense Refill   clomiPHENE (CLOMID) 50 MG tablet Take by mouth.     escitalopram (LEXAPRO) 10 MG tablet TAKE 1 TABLET(10 MG) BY MOUTH DAILY 90 tablet 1   amphetamine-dextroamphetamine (ADDERALL) 5 MG tablet Take 1 tablet (5 mg total) by mouth 2 (two) times daily with a meal. 60 tablet 0   No facility-administered medications prior to visit.    No Known Allergies  Review of Systems  Constitutional:        (-)unexpected weight change (-)Adenopathy  HENT:  Negative for hearing loss.        (-)Rhinorrhea   Eyes:        (-)Visual disturbance  Cardiovascular:  Negative for chest pain and leg swelling.  Gastrointestinal:  Positive for constipation. Negative for blood in stool, diarrhea, nausea and vomiting.       (+)pain in rectum  Genitourinary:  Negative for dysuria and frequency.  Musculoskeletal:  Negative for joint pain and myalgias.  Skin:  Negative for rash.  Neurological:  Negative for headaches.  Psychiatric/Behavioral:  Negative for depression. The patient is not nervous/anxious.       Objective:    Physical Exam Constitutional:      General: He is not in acute distress.    Appearance: Normal  appearance. He is not ill-appearing.  HENT:     Head: Normocephalic and atraumatic.     Right Ear: Tympanic membrane, ear canal and external ear normal.     Left Ear: Tympanic membrane, ear canal and external ear normal.  Eyes:     Extraocular Movements: Extraocular movements intact.     Right eye: No nystagmus.     Left eye: No nystagmus.     Pupils: Pupils are equal, round, and reactive to light.  Cardiovascular:  Rate and Rhythm: Normal rate and regular rhythm.     Heart sounds: Normal heart sounds. No murmur heard.   No gallop.  Pulmonary:     Effort: Pulmonary effort is normal. No respiratory distress.     Breath sounds: Normal breath sounds. No wheezing or rales.  Abdominal:     General: Bowel sounds are normal. There is no distension.     Palpations: Abdomen is soft.     Tenderness: There is no abdominal tenderness. There is no guarding.  Genitourinary:    Comments: Posterior tenderness during rectal exam Musculoskeletal:     Comments: 5/5 strength in both upper and lower extremities  Skin:    General: Skin is warm and dry.  Neurological:     Mental Status: He is alert and oriented to person, place, and time.     Deep Tendon Reflexes:     Reflex Scores:      Patellar reflexes are 2+ on the right side and 2+ on the left side. Psychiatric:        Behavior: Behavior normal.        Judgment: Judgment normal.    BP 134/71 (BP Location: Right Arm, Patient Position: Sitting, Cuff Size: Large)    Pulse (!) 56    Temp 97.7 F (36.5 C) (Oral)    Resp 16    Ht 6' (1.829 m)    Wt 209 lb (94.8 kg)    SpO2 100%    BMI 28.35 kg/m  Wt Readings from Last 3 Encounters:  05/29/21 209 lb (94.8 kg)  04/10/21 211 lb 9.6 oz (96 kg)  02/20/21 221 lb (100.2 kg)       Assessment & Plan:   Problem List Items Addressed This Visit       Unprioritized   Rectal pain    New. Suspect fissure.  Will refer to GI. Pt is advised as follows in the meantime:  Add metamucil once daily and  increase water. If you are still constipated, you may add miralax as needed.  Change adderall to 10mg  xr in the am and 5mg  in the afternoon.       Relevant Orders   Ambulatory referral to Gastroenterology   Preventative health care - Primary    Wt Readings from Last 3 Encounters:  05/29/21 209 lb (94.8 kg)  04/10/21 211 lb 9.6 oz (96 kg)  02/20/21 221 lb (100.2 kg)  He has lost a great deal of weight following his gastric sleeve procedure.  I commended him on this. We discussed adding 30 minutes 5 days a week of cardio. Labs as ordered. Recommended that he obtain covid bivalent booster.       Relevant Orders   Comp Met (CMET)   CBC with Differential/Platelet   TSH   Lipid panel   Hypertension    BP Readings from Last 3 Encounters:  05/29/21 134/71  02/20/21 (!) 141/80  11/07/20 (!) 147/86  BP is much better. He is not on medication. Monitor.       ADHD (attention deficit hyperactivity disorder)    Uncontrolled.  Will change adderall from 5mg  bid to 10mg  xr in the am and 5mg  in the PM.  Follow up in 1 month.       Other Visit Diagnoses     Weight loss       Relevant Orders   Comp Met (CMET)   CBC with Differential/Platelet   TSH   Lipid panel  Meds ordered this encounter  Medications   DISCONTD: amphetamine-dextroamphetamine (ADDERALL XR) 10 MG 24 hr capsule    Sig: Take 1 capsule (10 mg total) by mouth daily.    Dispense:  30 capsule    Refill:  0    Order Specific Question:   Supervising Provider    Answer:   Penni Homans A [[id number]]   DISCONTD: amphetamine-dextroamphetamine (ADDERALL) 5 MG tablet    Sig: Take 1 tab by mouth once daily in the afternoon    Dispense:  30 tablet    Refill:  0    Order Specific Question:   Supervising Provider    Answer:   Penni Homans A [[id number]]   DISCONTD: amphetamine-dextroamphetamine (ADDERALL XR) 10 MG 24 hr capsule    Sig: Take 1 capsule (10 mg total) by mouth daily.    Dispense:  30 capsule    Refill:  0    Order  Specific Question:   Supervising Provider    Answer:   Penni Homans A [[id number]]   DISCONTD: amphetamine-dextroamphetamine (ADDERALL) 5 MG tablet    Sig: Take 1 tab by mouth once daily in the afternoon    Dispense:  30 tablet    Refill:  0    Order Specific Question:   Supervising Provider    Answer:   Penni Homans A [[id number]]   amphetamine-dextroamphetamine (ADDERALL) 5 MG tablet    Sig: Take 1 tab by mouth once daily in the afternoon    Dispense:  30 tablet    Refill:  0    Order Specific Question:   Supervising Provider    Answer:   Penni Homans A [[id number]]   amphetamine-dextroamphetamine (ADDERALL XR) 10 MG 24 hr capsule    Sig: Take 1 capsule (10 mg total) by mouth daily.    Dispense:  30 capsule    Refill:  0    Order Specific Question:   Supervising Provider    Answer:   Penni Homans A [[id number]]    I, [name], personally preformed the services described in this documentation.  All medical record entries made by the scribe were at my direction and in my presence.  I have reviewed the chart and discharge instructions (if applicable) and agree that the record reflects my personal performance and is accurate and complete. 05/29/2021   I,[name],acting as a Education administrator for Nance Pear, NP.,have documented all relevant documentation on the behalf of Nance Pear, NP,as directed by  Nance Pear, NP while in the presence of Nance Pear, NP.   Nance Pear, NP

## 2021-05-29 NOTE — Patient Instructions (Addendum)
Please schedule a routine eye exam.  Add metamucil once daily and increase water. If you are still constipated, you may add miralax as needed.  Change adderall to 10mg  xr in the am and 5mg  in the afternoon.

## 2021-06-05 ENCOUNTER — Encounter: Payer: Self-pay | Admitting: Gastroenterology

## 2021-06-06 NOTE — Progress Notes (Signed)
Surgical Instructions    Your procedure is scheduled on 06/10/21.  Report to [facility] Main Entrance "A" at 5:30 A.M., then check in with the Admitting office.  Call this number if you have problems the morning of surgery:  [phone]   If you have any questions prior to your surgery date call [phone]: Open Monday-Friday 8am-4pm    Remember:  Do not eat or drink after midnight the night before your surgery      Take these medicines the morning of surgery with A SIP OF WATER  clomiPHENE (CLOMID) escitalopram (LEXAPRO)   As of today, STOP taking any Aspirin (unless otherwise instructed by your surgeon) Aleve, Naproxen, Ibuprofen, Motrin, Advil, Goody's, BC's, all herbal medications, fish oil, and all vitamins.     After your COVID test   You are not required to quarantine however you are required to wear a well-fitting mask when you are out and around people not in your household.  If your mask becomes wet or soiled, replace with a new one.  Wash your hands often with soap and water for 20 seconds or clean your hands with an alcohol-based hand sanitizer that contains at least 60% alcohol.  Do not share personal items.  Notify your provider: if you are in close contact with someone who has COVID  or if you develop a fever of 100.4 or greater, sneezing, cough, sore throat, shortness of breath or body aches.             Do not wear jewelry or makeup Do not wear lotions, powders, perfumes/colognes, or deodorant. Do not shave 48 hours prior to surgery.  Men may shave face and neck. Do not bring valuables to the hospital. DO Not wear nail polish, gel polish, artificial nails, or any other type of covering on natural nails including finger and toenails. If patients have artificial nails, gel coating, etc. that need to be removed by a nail salon, please have this removed prior to surgery or surgery may need to be canceled/delayed if the surgeon/ anesthesia feels like the  patient is unable to be adequately monitored.             [facility] is not responsible for any belongings or valuables.  Do NOT Smoke (Tobacco/Vaping)  24 hours prior to your procedure  If you use a CPAP at night, you may bring your mask for your overnight stay.   Contacts, glasses, hearing aids, dentures or partials may not be worn into surgery, please bring cases for these belongings   For patients admitted to the hospital, discharge time will be determined by your treatment team.   Patients discharged the day of surgery will not be allowed to drive home, and someone needs to stay with them for 24 hours.  NO VISITORS WILL BE ALLOWED IN PRE-OP WHERE PATIENTS ARE PREPPED FOR SURGERY.  ONLY 1 SUPPORT PERSON MAY BE PRESENT IN THE WAITING ROOM WHILE YOU ARE IN SURGERY.  IF YOU ARE TO BE ADMITTED, ONCE YOU ARE IN YOUR ROOM YOU WILL BE ALLOWED TWO (2) VISITORS. 1 (ONE) VISITOR MAY STAY OVERNIGHT BUT MUST ARRIVE TO THE ROOM BY 8pm.  Minor children may have two parents present. Special consideration for safety and communication needs will be reviewed on a case by case basis.  Special instructions:    Oral Hygiene is also important to reduce your risk of infection.  Remember - BRUSH YOUR TEETH THE MORNING OF SURGERY WITH YOUR REGULAR TOOTHPASTE   [facility]- Preparing For Surgery  Before surgery, you can play an important role. Because skin is not sterile, your skin needs to be as free of germs as possible. You can reduce the number of germs on your skin by washing with CHG (chlorahexidine gluconate) Soap before surgery.  CHG is an antiseptic cleaner which kills germs and bonds with the skin to continue killing germs even after washing.     Please do not use if you have an allergy to CHG or antibacterial soaps. If your skin becomes reddened/irritated stop using the CHG.  Do not shave (including legs and underarms) for at least 48 hours prior to first CHG shower. It is OK to shave your  face.  Please follow these instructions carefully.     Shower the NIGHT BEFORE SURGERY and the MORNING OF SURGERY with CHG Soap.   If you chose to wash your hair, wash your hair first as usual with your normal shampoo. After you shampoo, rinse your hair and body thoroughly to remove the shampoo.  Then [employer] and genitals (private parts) with your normal soap and rinse thoroughly to remove soap.  After that Use CHG Soap as you would any other liquid soap. You can apply CHG directly to the skin and wash gently with a scrungie or a clean washcloth.   Apply the CHG Soap to your body ONLY FROM THE NECK DOWN.  Do not use on open wounds or open sores. Avoid contact with your eyes, ears, mouth and genitals (private parts). Wash Face and genitals (private parts)  with your normal soap.   Wash thoroughly, paying special attention to the area where your surgery will be performed.  Thoroughly rinse your body with warm water from the neck down.  DO NOT shower/wash with your normal soap after using and rinsing off the CHG Soap.  Pat yourself dry with a CLEAN TOWEL.  Wear CLEAN PAJAMAS to bed the night before surgery  Place CLEAN SHEETS on your bed the night before your surgery  DO NOT SLEEP WITH PETS.   Day of Surgery: Take a shower with CHG soap. Wear Clean/Comfortable clothing the morning of surgery Do not apply any deodorants/lotions.   Remember to brush your teeth WITH YOUR REGULAR TOOTHPASTE.   Please read over the following fact sheets that you were given.

## 2021-06-07 ENCOUNTER — Other Ambulatory Visit: Payer: Self-pay

## 2021-06-07 ENCOUNTER — Encounter (HOSPITAL_COMMUNITY): Payer: Self-pay

## 2021-06-07 ENCOUNTER — Encounter (HOSPITAL_COMMUNITY)
Admission: RE | Admit: 2021-06-07 | Discharge: 2021-06-07 | Disposition: A | Discharge status: Home / Self Care | Payer: BC Managed Care – PPO | Source: Ambulatory Visit | Attending: Oral Surgery | Admitting: Oral Surgery

## 2021-06-07 VITALS — BP 150/84 | HR 60 | Temp 97.7°F | Resp 17 | Ht 72.0 in | Wt 206.4 lb

## 2021-06-07 DIAGNOSIS — F32A Depression, unspecified: Secondary | ICD-10-CM | POA: Diagnosis not present

## 2021-06-07 DIAGNOSIS — F419 Anxiety disorder, unspecified: Secondary | ICD-10-CM | POA: Diagnosis not present

## 2021-06-07 DIAGNOSIS — Z01818 Encounter for other preprocedural examination: Secondary | ICD-10-CM

## 2021-06-07 DIAGNOSIS — I10 Essential (primary) hypertension: Secondary | ICD-10-CM | POA: Diagnosis not present

## 2021-06-07 DIAGNOSIS — M2602 Maxillary hypoplasia: Secondary | ICD-10-CM | POA: Diagnosis not present

## 2021-06-07 DIAGNOSIS — Z20822 Contact with and (suspected) exposure to covid-19: Secondary | ICD-10-CM | POA: Insufficient documentation

## 2021-06-07 DIAGNOSIS — Z8616 Personal history of COVID-19: Secondary | ICD-10-CM | POA: Insufficient documentation

## 2021-06-07 DIAGNOSIS — G4733 Obstructive sleep apnea (adult) (pediatric): Secondary | ICD-10-CM | POA: Diagnosis not present

## 2021-06-07 DIAGNOSIS — Z79899 Other long term (current) drug therapy: Secondary | ICD-10-CM | POA: Diagnosis not present

## 2021-06-07 DIAGNOSIS — Z01812 Encounter for preprocedural laboratory examination: Secondary | ICD-10-CM | POA: Insufficient documentation

## 2021-06-07 LAB — CBC WITH DIFFERENTIAL/PLATELET
Abs Immature Granulocytes: 0.01 10*3/uL (ref 0.00–0.07)
Basophils Absolute: 0 10*3/uL (ref 0.0–0.1)
Basophils Relative: 1 %
Eosinophils Absolute: 0.1 10*3/uL (ref 0.0–0.5)
Eosinophils Relative: 2 %
HCT: 43.3 % (ref 39.0–52.0)
Hemoglobin: 14.3 g/dL (ref 13.0–17.0)
Immature Granulocytes: 0 %
Lymphocytes Relative: 39 %
Lymphs Abs: 1.7 10*3/uL (ref 0.7–4.0)
MCH: 31.4 pg (ref 26.0–34.0)
MCHC: 33 g/dL (ref 30.0–36.0)
MCV: 95.2 fL (ref 80.0–100.0)
Monocytes Absolute: 0.5 10*3/uL (ref 0.1–1.0)
Monocytes Relative: 11 %
Neutro Abs: 2 10*3/uL (ref 1.7–7.7)
Neutrophils Relative %: 47 %
Platelets: 172 10*3/uL (ref 150–400)
RBC: 4.55 MIL/uL (ref 4.22–5.81)
RDW: 12.1 % (ref 11.5–15.5)
WBC: 4.3 10*3/uL (ref 4.0–10.5)
nRBC: 0 % (ref 0.0–0.2)

## 2021-06-07 LAB — PROTIME-INR
INR: 1.3 — ABNORMAL HIGH (ref 0.8–1.2)
Prothrombin Time: 16 seconds — ABNORMAL HIGH (ref 11.4–15.2)

## 2021-06-07 LAB — TYPE AND SCREEN
ABO/RH(D): A POS
Antibody Screen: NEGATIVE

## 2021-06-07 LAB — BASIC METABOLIC PANEL
Anion gap: 6 (ref 5–15)
BUN: 6 mg/dL (ref 6–20)
CO2: 27 mmol/L (ref 22–32)
Calcium: 9.2 mg/dL (ref 8.9–10.3)
Chloride: 107 mmol/L (ref 98–111)
Creatinine, Ser: 0.85 mg/dL (ref 0.61–1.24)
GFR, Estimated: >60 mL/min (ref >60)
Glucose, Bld: 55 mg/dL — ABNORMAL LOW (ref 70–99)
Potassium: 3.6 mmol/L (ref 3.5–5.1)
Sodium: 140 mmol/L (ref 135–145)

## 2021-06-07 LAB — SARS CORONAVIRUS 2 (TAT 6-24 HRS): SARS Coronavirus 2: NEGATIVE

## 2021-06-07 NOTE — Progress Notes (Signed)
Surgical Instructions    Your procedure is scheduled on 06/10/21.  Report to [facility] Main Entrance "A" at 5:30 A.M., then check in with the Admitting office.  Call this number if you have problems the morning of surgery:  [phone]   If you have any questions prior to your surgery date call [phone]: Open Monday-Friday 8am-4pm    Remember:  Do not eat or drink after midnight the night before your surgery      Take these medicines the morning of surgery with A SIP OF WATER  clomiPHENE (CLOMID) escitalopram (LEXAPRO)   As of today, STOP taking any Aspirin (unless otherwise instructed by your surgeon) Aleve, Naproxen, Ibuprofen, Motrin, Advil, Goody's, BC's, all herbal medications, fish oil, and all vitamins.     After your COVID test   You are not required to quarantine however you are required to wear a well-fitting mask when you are out and around people not in your household.  If your mask becomes wet or soiled, replace with a new one.  Wash your hands often with soap and water for 20 seconds or clean your hands with an alcohol-based hand sanitizer that contains at least 60% alcohol.  Do not share personal items.  Notify your provider: if you are in close contact with someone who has COVID  or if you develop a fever of 100.4 or greater, sneezing, cough, sore throat, shortness of breath or body aches.           Do not wear jewelry or makeup Do not wear lotions, powders, colognes, or deodorant. Do not shave 48 hours prior to surgery.  Men may shave face and neck. Do not bring valuables to the hospital.              [facility] is not responsible for any belongings or valuables.  Do NOT Smoke (Tobacco/Vaping)  24 hours prior to your procedure  If you use a CPAP at night, you may bring your mask for your overnight stay.   Contacts, glasses, hearing aids, dentures or partials may not be worn into surgery, please bring cases for these belongings   For patients  admitted to the hospital, discharge time will be determined by your treatment team.   Patients discharged the day of surgery will not be allowed to drive home, and someone needs to stay with them for 24 hours.  NO VISITORS WILL BE ALLOWED IN PRE-OP WHERE PATIENTS ARE PREPPED FOR SURGERY.  ONLY 1 SUPPORT PERSON MAY BE PRESENT IN THE WAITING ROOM WHILE YOU ARE IN SURGERY.  IF YOU ARE TO BE ADMITTED, ONCE YOU ARE IN YOUR ROOM YOU WILL BE ALLOWED TWO (2) VISITORS. 1 (ONE) VISITOR MAY STAY OVERNIGHT BUT MUST ARRIVE TO THE ROOM BY 8pm.  Minor children may have two parents present. Special consideration for safety and communication needs will be reviewed on a case by case basis.  Special instructions:    Oral Hygiene is also important to reduce your risk of infection.  Remember - BRUSH YOUR TEETH THE MORNING OF SURGERY WITH YOUR REGULAR TOOTHPASTE   [place]- Preparing For Surgery  Before surgery, you can play an important role. Because skin is not sterile, your skin needs to be as free of germs as possible. You can reduce the number of germs on your skin by washing with CHG (chlorahexidine gluconate) Soap before surgery.  CHG is an antiseptic cleaner which kills germs and bonds with the skin to continue killing germs even after  washing.     Please do not use if you have an allergy to CHG or antibacterial soaps. If your skin becomes reddened/irritated stop using the CHG.  Do not shave (including legs and underarms) for at least 48 hours prior to first CHG shower. It is OK to shave your face.  Please follow these instructions carefully.     Shower the NIGHT BEFORE SURGERY and the MORNING OF SURGERY with CHG Soap.   If you chose to wash your hair, wash your hair first as usual with your normal shampoo. After you shampoo, rinse your hair and body thoroughly to remove the shampoo.  Then [employer] and genitals (private parts) with your normal soap and rinse thoroughly to remove soap.  After that Use  CHG Soap as you would any other liquid soap. You can apply CHG directly to the skin and wash gently with a scrungie or a clean washcloth.   Apply the CHG Soap to your body ONLY FROM THE NECK DOWN.  Do not use on open wounds or open sores. Avoid contact with your eyes, ears, mouth and genitals (private parts). Wash Face and genitals (private parts)  with your normal soap.   Wash thoroughly, paying special attention to the area where your surgery will be performed.  Thoroughly rinse your body with warm water from the neck down.  DO NOT shower/wash with your normal soap after using and rinsing off the CHG Soap.  Pat yourself dry with a CLEAN TOWEL.  Wear CLEAN PAJAMAS to bed the night before surgery  Place CLEAN SHEETS on your bed the night before your surgery  DO NOT SLEEP WITH PETS.   Day of Surgery: Take a shower with CHG soap. Wear Clean/Comfortable clothing the morning of surgery Do not apply any deodorants/lotions.   Remember to brush your teeth WITH YOUR REGULAR TOOTHPASTE.   Please read over the following fact sheets that you were given.

## 2021-06-07 NOTE — Progress Notes (Signed)
PCP: Sandford Craze, NP Cardiologist: denies  EKG: 07/29/20 CXR: na ECHO: denies Stress Test: denies Cardiac Cath: denies  Fasting Blood Sugar- na Checks Blood Sugar_na__ times a day  OSA: Yes, does not wear cpap  ASA/Blood Thinner: No  Covid test 06/07/21 at PAT  Anesthesia Review: No  Patient denies shortness of breath, fever, cough, and chest pain at PAT appointment.  Patient verbalized understanding of instructions provided today at the PAT appointment.  Patient asked to review instructions at home and day of surgery.

## 2021-06-08 NOTE — Progress Notes (Signed)
Anesthesia Chart Review:  Case: [id number] Date/Time: 06/10/21 0715   Procedures:      MANDIBULAR OSTEOTOMY     LEFORT 1 MAXILLARY OSTEOTOMY   Anesthesia type: General   Pre-op diagnosis: MAXILLARY HYPOPLASIA   [facility]: [facility] ROOM 05 / [facility] OR   Surgeons: Exie Parody, DMD       DISCUSSION: Patient is a 35 year old male scheduled for the above procedure.  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  Preoperative labs from 06/07/21 show a PT 16.0 and INR 1.3. He is not on any blood thinner. He is on DHEA and DIM (Diindolyl-methane), MVI, Lexapro, Clomid, anastozole, Adderall. PLT count normal and recent normal LFTs. He was instructed to hold vitamins, supplements, herbal medications until after surgery at his PAT visit. I spoke with Clydie Braun at Dr. Joie Bimler office who confirmed he had reviewed labs and felt they were acceptable for surgery.   06/07/2021 presurgical COVID-19 test negative.  Anesthesia team to evaluate on the day of surgery.   VS: BP (!) 150/84    Pulse 60    Temp 36.5 C (Oral)    Resp 17    Ht 6' (1.829 m)    Wt 93.6 kg    SpO2 100%    BMI 27.99 kg/m    PROVIDERS: Sandford Craze, NP Phylliss Blakes, MD is general surgeon Tommie Ard, MD is urologist. Next visit 06/20/21 with Adele Barthel, MD. He is followed for hypogonadism. He has been on clomid, anastrozole, and testosterone injections at some point.    LABS: Preoperative labs noted. Glucose 55, no diabetes history.  PT/INR 16.0/1.3 (no blood thinners and no comparison PT/INR seen). LFTs normal 05/29/21. PLT count normal. TSH 1.05 05/29/21. (all labs ordered are listed, but only abnormal results are  displayed)  Labs Reviewed  BASIC METABOLIC PANEL - Abnormal; Notable for the following components:      Result Value   Glucose, Bld 55 (*)    All other components within normal limits  PROTIME-INR - Abnormal; Notable for the following components:   Prothrombin Time 16.0 (*)    INR 1.3 (*)    All other components within normal limits  SARS CORONAVIRUS 2 (TAT 6-24 HRS)  CBC WITH DIFFERENTIAL/PLATELET  TYPE AND SCREEN    Home Sleep Study 08/16/20 (prior to bariatric surgery): Weight (lbs): 295, BMI 39 IMPRESSIONS - Moderate obstructive sleep apnea occurred during this study (AHI = 22.9/h). - No significant central sleep apnea occurred during this study (CAI = 0.0/h). - Moderate oxygen desaturation was noted during this study (Min O2 = 81%). Mean O2 saturation 94%. - Patient snored. RECOMMENDATIONS - Suggest CPAP titiration sleep study or autopap. Other options would be based on clinical judgment... 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 No bony defect in the maxilla. The mandible is normally developed. No dental abnormality.    EKG: 07/29/20: NSR   CV: N/A  Past Medical History:  Diagnosis Date   Acute calculous cholecystitis 08/13/2011   Anxiety    Depression    Elevated blood pressure    Hypertension    Nephrotic syndrome 1989   Sleep apnea     Past Surgical History:  Procedure Laterality Date   CHOLECYSTECTOMY  08/13/2011  Procedure: LAPAROSCOPIC CHOLECYSTECTOMY WITH INTRAOPERATIVE CHOLANGIOGRAM;  Surgeon: Rulon Abide, DO;  Location: [facility] OR;  Service: General;  Laterality: N/A;      LAPAROSCOPIC GASTRIC SLEEVE RESECTION N/A 10/10/2020   Procedure: LAPAROSCOPIC GASTRIC SLEEVE RESECTION;  Surgeon: Berna Bue, MD;  Location: [facility] ORS;  Service: General;  Laterality: N/A;   TONSILLECTOMY  2001   UPPER GI ENDOSCOPY N/A 10/10/2020   Procedure: UPPER GI  ENDOSCOPY;  Surgeon: Berna Bue, MD;  Location: [facility] ORS;  Service: General;  Laterality: N/A;    MEDICATIONS:  amphetamine-dextroamphetamine (ADDERALL XR) 10 MG 24 hr capsule   amphetamine-dextroamphetamine (ADDERALL) 5 MG tablet   anastrozole (ARIMIDEX) 1 MG tablet   clomiPHENE (CLOMID) 50 MG tablet   DHEA 50 MG CAPS   escitalopram (LEXAPRO) 10 MG tablet   Multiple Vitamins-Minerals (MULTIVITAMIN WITH MINERALS) tablet   OVER THE COUNTER MEDICATION   No current facility-administered medications for this encounter.    Shonna Chock, PA-C Surgical Short Stay/Anesthesiology [facility] Phone [phone] [facility] Phone [phone] 06/08/2021 4:21 PM

## 2021-06-10 ENCOUNTER — Ambulatory Visit (HOSPITAL_COMMUNITY): Payer: BC Managed Care – PPO | Admitting: Anesthesiology

## 2021-06-10 ENCOUNTER — Observation Stay (HOSPITAL_COMMUNITY)
Admission: RE | Admit: 2021-06-10 | Discharge: 2021-06-11 | Disposition: A | Discharge status: Home / Self Care | Payer: BC Managed Care – PPO | Attending: Internal Medicine | Admitting: Internal Medicine

## 2021-06-10 ENCOUNTER — Encounter (HOSPITAL_COMMUNITY)
Admission: RE | Disposition: A | Discharge status: Home / Self Care | Payer: Self-pay | Source: Home / Self Care | Attending: Internal Medicine

## 2021-06-10 ENCOUNTER — Encounter (HOSPITAL_COMMUNITY): Payer: Self-pay | Admitting: Oral Surgery

## 2021-06-10 ENCOUNTER — Ambulatory Visit (HOSPITAL_COMMUNITY): Payer: BC Managed Care – PPO | Admitting: Vascular Surgery

## 2021-06-10 ENCOUNTER — Other Ambulatory Visit: Payer: Self-pay

## 2021-06-10 DIAGNOSIS — Z79899 Other long term (current) drug therapy: Secondary | ICD-10-CM | POA: Insufficient documentation

## 2021-06-10 DIAGNOSIS — E291 Testicular hypofunction: Secondary | ICD-10-CM | POA: Insufficient documentation

## 2021-06-10 DIAGNOSIS — M2602 Maxillary hypoplasia: Secondary | ICD-10-CM | POA: Insufficient documentation

## 2021-06-10 DIAGNOSIS — M2603 Mandibular hyperplasia: Principal | ICD-10-CM | POA: Insufficient documentation

## 2021-06-10 DIAGNOSIS — Z903 Acquired absence of stomach [part of]: Secondary | ICD-10-CM

## 2021-06-10 DIAGNOSIS — F909 Attention-deficit hyperactivity disorder, unspecified type: Secondary | ICD-10-CM | POA: Diagnosis present

## 2021-06-10 DIAGNOSIS — F339 Major depressive disorder, recurrent, unspecified: Secondary | ICD-10-CM | POA: Diagnosis present

## 2021-06-10 DIAGNOSIS — I10 Essential (primary) hypertension: Secondary | ICD-10-CM | POA: Insufficient documentation

## 2021-06-10 DIAGNOSIS — F418 Other specified anxiety disorders: Secondary | ICD-10-CM | POA: Diagnosis present

## 2021-06-10 DIAGNOSIS — Z9889 Other specified postprocedural states: Secondary | ICD-10-CM

## 2021-06-10 HISTORY — DX: Maxillary hypoplasia: M26.02

## 2021-06-10 HISTORY — PX: MAXILLA OSTEOTOMY: SHX1008

## 2021-06-10 HISTORY — PX: MANDIBLE OSTEOTOMY: SHX1007

## 2021-06-10 LAB — HIV ANTIBODY (ROUTINE TESTING W REFLEX): HIV Screen 4th Generation wRfx: NONREACTIVE

## 2021-06-10 SURGERY — OSTEOTOMY, MANDIBLE
Anesthesia: General

## 2021-06-10 MED ORDER — PHENYLEPHRINE 40 MCG/ML (10ML) SYRINGE FOR IV PUSH (FOR BLOOD PRESSURE SUPPORT)
PREFILLED_SYRINGE | INTRAVENOUS | Status: DC | PRN
  Administered 2021-06-10 (×2): 80 ug via INTRAVENOUS
  Administered 2021-06-10 (×2): 40 ug via INTRAVENOUS

## 2021-06-10 MED ORDER — KETOROLAC TROMETHAMINE 30 MG/ML IJ SOLN
30.0000 mg | Freq: Four times a day (QID) | INTRAMUSCULAR | Status: DC | PRN
  Administered 2021-06-10 – 2021-06-11 (×3): 30 mg via INTRAVENOUS
  Filled 2021-06-10 (×3): qty 1

## 2021-06-10 MED ORDER — ACETAMINOPHEN 10 MG/ML IV SOLN
INTRAVENOUS | Status: DC | PRN
  Administered 2021-06-10: 1000 mg via INTRAVENOUS

## 2021-06-10 MED ORDER — DEXAMETHASONE SODIUM PHOSPHATE 10 MG/ML IJ SOLN
10.0000 mg | Freq: Three times a day (TID) | INTRAMUSCULAR | Status: AC
  Administered 2021-06-10 – 2021-06-11 (×3): 10 mg via INTRAVENOUS
  Filled 2021-06-10 (×4): qty 1

## 2021-06-10 MED ORDER — PHENYLEPHRINE HCL-NACL 20-0.9 MG/250ML-% IV SOLN
INTRAVENOUS | Status: DC | PRN
  Administered 2021-06-10: 25 ug/min via INTRAVENOUS

## 2021-06-10 MED ORDER — ONDANSETRON HCL 4 MG/2ML IJ SOLN: 4.0000 mg | Freq: Four times a day (QID) | INTRAMUSCULAR | Status: DC | PRN

## 2021-06-10 MED ORDER — LACTATED RINGERS IV SOLN: INTRAVENOUS | Status: DC

## 2021-06-10 MED ORDER — ACETAMINOPHEN 325 MG PO TABS: 650.0000 mg | ORAL_TABLET | Freq: Four times a day (QID) | ORAL | Status: DC | PRN

## 2021-06-10 MED ORDER — OXYMETAZOLINE HCL 0.05 % NA SOLN
NASAL | Status: AC
  Filled 2021-06-10: qty 30

## 2021-06-10 MED ORDER — DEXAMETHASONE SODIUM PHOSPHATE 10 MG/ML IJ SOLN
INTRAMUSCULAR | Status: DC | PRN
  Administered 2021-06-10: 10 mg via INTRAVENOUS

## 2021-06-10 MED ORDER — DOCUSATE SODIUM 100 MG PO CAPS: 100.0000 mg | ORAL_CAPSULE | Freq: Two times a day (BID) | ORAL | Status: DC

## 2021-06-10 MED ORDER — FENTANYL CITRATE (PF) 250 MCG/5ML IJ SOLN
INTRAMUSCULAR | Status: DC | PRN
  Administered 2021-06-10 (×5): 50 ug via INTRAVENOUS
  Administered 2021-06-10: 150 ug via INTRAVENOUS
  Administered 2021-06-10 (×3): 50 ug via INTRAVENOUS

## 2021-06-10 MED ORDER — ONDANSETRON HCL 4 MG/2ML IJ SOLN
INTRAMUSCULAR | Status: DC | PRN
  Administered 2021-06-10 (×2): 4 mg via INTRAVENOUS

## 2021-06-10 MED ORDER — PROPOFOL 10 MG/ML IV BOLUS
INTRAVENOUS | Status: AC
  Filled 2021-06-10: qty 20

## 2021-06-10 MED ORDER — CHLORHEXIDINE GLUCONATE 0.12 % MT SOLN
OROMUCOSAL | Status: AC
  Administered 2021-06-10: 15 mL via OROMUCOSAL
  Filled 2021-06-10: qty 15

## 2021-06-10 MED ORDER — FENTANYL CITRATE (PF) 250 MCG/5ML IJ SOLN
INTRAMUSCULAR | Status: AC
  Filled 2021-06-10: qty 5

## 2021-06-10 MED ORDER — LIDOCAINE 2% (20 MG/ML) 5 ML SYRINGE
INTRAMUSCULAR | Status: DC | PRN
  Administered 2021-06-10: 50 mg via INTRAVENOUS

## 2021-06-10 MED ORDER — FENTANYL CITRATE (PF) 100 MCG/2ML IJ SOLN
25.0000 ug | INTRAMUSCULAR | Status: DC | PRN
  Administered 2021-06-10 (×2): 25 ug via INTRAVENOUS

## 2021-06-10 MED ORDER — ESCITALOPRAM OXALATE 10 MG PO TABS
10.0000 mg | ORAL_TABLET | Freq: Every day | ORAL | Status: DC
  Administered 2021-06-11: 09:00:00 10 mg via ORAL
  Filled 2021-06-10: qty 1

## 2021-06-10 MED ORDER — ONDANSETRON HCL 4 MG/2ML IJ SOLN
INTRAMUSCULAR | Status: AC
  Filled 2021-06-10: qty 2

## 2021-06-10 MED ORDER — GLYCOPYRROLATE PF 0.2 MG/ML IJ SOSY
PREFILLED_SYRINGE | INTRAMUSCULAR | Status: AC
  Filled 2021-06-10: qty 1

## 2021-06-10 MED ORDER — BACITRACIN ZINC 500 UNIT/GM EX OINT
TOPICAL_OINTMENT | CUTANEOUS | Status: DC | PRN
  Administered 2021-06-10: 1 via TOPICAL

## 2021-06-10 MED ORDER — CHLORHEXIDINE GLUCONATE 0.12 % MT SOLN
OROMUCOSAL | Status: AC
  Filled 2021-06-10: qty 15

## 2021-06-10 MED ORDER — LIDOCAINE 2% (20 MG/ML) 5 ML SYRINGE
INTRAMUSCULAR | Status: AC
  Filled 2021-06-10: qty 5

## 2021-06-10 MED ORDER — MIDAZOLAM HCL 2 MG/2ML IJ SOLN
INTRAMUSCULAR | Status: DC | PRN
  Administered 2021-06-10: 2 mg via INTRAVENOUS

## 2021-06-10 MED ORDER — LIDOCAINE-EPINEPHRINE 2 %-1:100000 IJ SOLN
INTRAMUSCULAR | Status: DC | PRN
  Administered 2021-06-10: 10 mL via INTRADERMAL

## 2021-06-10 MED ORDER — 0.9 % SODIUM CHLORIDE (POUR BTL) OPTIME
TOPICAL | Status: DC | PRN
  Administered 2021-06-10: 1000 mL
  Administered 2021-06-10: 2000 mL

## 2021-06-10 MED ORDER — ROCURONIUM BROMIDE 10 MG/ML (PF) SYRINGE
PREFILLED_SYRINGE | INTRAVENOUS | Status: DC | PRN
  Administered 2021-06-10: 20 mg via INTRAVENOUS
  Administered 2021-06-10: 40 mg via INTRAVENOUS
  Administered 2021-06-10: 60 mg via INTRAVENOUS
  Administered 2021-06-10: 30 mg via INTRAVENOUS
  Administered 2021-06-10: 20 mg via INTRAVENOUS
  Administered 2021-06-10: 30 mg via INTRAVENOUS

## 2021-06-10 MED ORDER — DEXAMETHASONE SODIUM PHOSPHATE 10 MG/ML IJ SOLN
INTRAMUSCULAR | Status: AC
  Filled 2021-06-10: qty 1

## 2021-06-10 MED ORDER — ORAL CARE MOUTH RINSE: 15.0000 mL | Freq: Once | OROMUCOSAL | Status: AC

## 2021-06-10 MED ORDER — HYDRALAZINE HCL 20 MG/ML IJ SOLN: 10.0000 mg | INTRAMUSCULAR | Status: DC | PRN

## 2021-06-10 MED ORDER — OXYCODONE HCL 5 MG/5ML PO SOLN: 5.0000 mg | Freq: Once | ORAL | Status: DC | PRN

## 2021-06-10 MED ORDER — CEFAZOLIN SODIUM-DEXTROSE 2-4 GM/100ML-% IV SOLN
2.0000 g | INTRAVENOUS | Status: AC
  Administered 2021-06-10 (×2): 2 g via INTRAVENOUS

## 2021-06-10 MED ORDER — MIDAZOLAM HCL 2 MG/2ML IJ SOLN
INTRAMUSCULAR | Status: AC
  Filled 2021-06-10: qty 2

## 2021-06-10 MED ORDER — SUGAMMADEX SODIUM 200 MG/2ML IV SOLN
INTRAVENOUS | Status: DC | PRN
  Administered 2021-06-10: 200 mg via INTRAVENOUS

## 2021-06-10 MED ORDER — PHENYLEPHRINE 40 MCG/ML (10ML) SYRINGE FOR IV PUSH (FOR BLOOD PRESSURE SUPPORT)
PREFILLED_SYRINGE | INTRAVENOUS | Status: AC
  Filled 2021-06-10: qty 10

## 2021-06-10 MED ORDER — ROCURONIUM BROMIDE 10 MG/ML (PF) SYRINGE
PREFILLED_SYRINGE | INTRAVENOUS | Status: AC
  Filled 2021-06-10: qty 10

## 2021-06-10 MED ORDER — GLYCOPYRROLATE 0.2 MG/ML IJ SOLN
INTRAMUSCULAR | Status: DC | PRN
  Administered 2021-06-10: .2 mg via INTRAVENOUS

## 2021-06-10 MED ORDER — OXYCODONE HCL 5 MG PO TABS: 5.0000 mg | ORAL_TABLET | Freq: Once | ORAL | Status: DC | PRN

## 2021-06-10 MED ORDER — TRANEXAMIC ACID-NACL 1000-0.7 MG/100ML-% IV SOLN
INTRAVENOUS | Status: DC | PRN
  Administered 2021-06-10: 1000 mg via INTRAVENOUS

## 2021-06-10 MED ORDER — FENTANYL CITRATE (PF) 100 MCG/2ML IJ SOLN
INTRAMUSCULAR | Status: AC
  Filled 2021-06-10: qty 2

## 2021-06-10 MED ORDER — BACITRACIN ZINC 500 UNIT/GM EX OINT
TOPICAL_OINTMENT | CUTANEOUS | Status: AC
  Filled 2021-06-10: qty 28.35

## 2021-06-10 MED ORDER — TRANEXAMIC ACID-NACL 1000-0.7 MG/100ML-% IV SOLN
INTRAVENOUS | Status: AC
  Filled 2021-06-10: qty 100

## 2021-06-10 MED ORDER — CEFAZOLIN SODIUM-DEXTROSE 1-4 GM/50ML-% IV SOLN
1.0000 g | Freq: Three times a day (TID) | INTRAVENOUS | Status: AC
  Administered 2021-06-10: 1 g via INTRAVENOUS
  Filled 2021-06-10: qty 50

## 2021-06-10 MED ORDER — ACETAMINOPHEN 650 MG RE SUPP: 650.0000 mg | Freq: Four times a day (QID) | RECTAL | Status: DC | PRN

## 2021-06-10 MED ORDER — MORPHINE SULFATE (PF) 2 MG/ML IV SOLN: 2.0000 mg | INTRAVENOUS | Status: DC | PRN

## 2021-06-10 MED ORDER — OXYMETAZOLINE HCL 0.05 % NA SOLN
NASAL | Status: DC | PRN
  Administered 2021-06-10: 1 via TOPICAL

## 2021-06-10 MED ORDER — CHLORHEXIDINE GLUCONATE 0.12 % MT SOLN: 15.0000 mL | Freq: Once | OROMUCOSAL | Status: AC

## 2021-06-10 MED ORDER — LIDOCAINE-EPINEPHRINE 2 %-1:100000 IJ SOLN
INTRAMUSCULAR | Status: AC
  Filled 2021-06-10: qty 1

## 2021-06-10 MED ORDER — PROPOFOL 10 MG/ML IV BOLUS
INTRAVENOUS | Status: DC | PRN
  Administered 2021-06-10: 50 mg via INTRAVENOUS
  Administered 2021-06-10: 200 mg via INTRAVENOUS
  Administered 2021-06-10: 50 mg via INTRAVENOUS

## 2021-06-10 MED ORDER — CEFAZOLIN SODIUM 1 G IJ SOLR
INTRAMUSCULAR | Status: AC
  Filled 2021-06-10: qty 20

## 2021-06-10 MED ORDER — CEFAZOLIN SODIUM-DEXTROSE 2-4 GM/100ML-% IV SOLN
INTRAVENOUS | Status: AC
  Filled 2021-06-10: qty 100

## 2021-06-10 MED ORDER — ACETAMINOPHEN 10 MG/ML IV SOLN
INTRAVENOUS | Status: AC
  Filled 2021-06-10: qty 100

## 2021-06-10 MED ORDER — ONDANSETRON 4 MG PO TBDP: 4.0000 mg | ORAL_TABLET | Freq: Four times a day (QID) | ORAL | Status: DC | PRN

## 2021-06-10 MED ORDER — OXYCODONE HCL 5 MG PO TABS: 5.0000 mg | ORAL_TABLET | ORAL | Status: DC | PRN

## 2021-06-10 MED ORDER — SODIUM CHLORIDE 0.9 % IR SOLN
Status: DC | PRN
  Administered 2021-06-10: 500 mL

## 2021-06-10 SURGICAL SUPPLY — 62 items
ATTRACTOMAT 16X20 MAGNETIC DRP (DRAPES) ×2 IMPLANT
BAG COUNTER SPONGE SURGICOUNT (BAG) ×1 IMPLANT
BAG SURGICOUNT SPONGE COUNTING (BAG) ×1
BIT DRILL TWIST 1.3X5 (BIT) ×1
BIT DRILL TWIST 1.3X5MM (BIT) IMPLANT
BIT DRILL TWIST 1.6X17X6 WL (DRILL) IMPLANT
BLADE RECIPRO TAPERED (BLADE) ×2 IMPLANT
BLADE SURG 15 STRL LF DISP TIS (BLADE) ×1 IMPLANT
BLADE SURG 15 STRL SS (BLADE) ×6
BUR CROSS CUT FISSURE 1.6 (BURR) ×1 IMPLANT
BUR CROSS CUT FISSURE 1.6MM (BURR) ×1
BUR EGG ELITE 4.0 (BURR) ×1 IMPLANT
BUR EGG ELITE 4.0MM (BURR) ×1
BUR SABER DIAMOND 2.5 (BURR) ×2 IMPLANT
CANISTER SUCT 3000ML PPV (MISCELLANEOUS) ×3 IMPLANT
CLEANER TIP ELECTROSURG 2X2 (MISCELLANEOUS) ×2 IMPLANT
COVER SURGICAL LIGHT HANDLE (MISCELLANEOUS) ×3 IMPLANT
DRAPE HALF SHEET 40X57 (DRAPES) ×2 IMPLANT
DRAPE U-SHAPE 76X120 STRL (DRAPES) ×3 IMPLANT
DRILL BIT TWIST 1.3X5MM (BIT) ×2
DRILL TWIST 1.6X17X6 WL (DRILL) ×3
ELECT NDL BLADE 2-5/6 (NEEDLE) IMPLANT
ELECT NEEDLE BLADE 2-5/6 (NEEDLE) ×3 IMPLANT
GAUZE 4X4 16PLY ~~LOC~~+RFID DBL (SPONGE) ×2 IMPLANT
GAUZE SPONGE 4X4 12PLY STRL LF (GAUZE/BANDAGES/DRESSINGS) ×1 IMPLANT
GLOVE SRG 8 PF TXTR STRL LF DI (GLOVE) IMPLANT
GLOVE SURG ENC MOIS LTX SZ8 (GLOVE) ×5 IMPLANT
GLOVE SURG UNDER POLY LF SZ8 (GLOVE) ×4
GOWN STRL REUS W/ TWL LRG LVL3 (GOWN DISPOSABLE) ×1 IMPLANT
GOWN STRL REUS W/ TWL XL LVL3 (GOWN DISPOSABLE) ×1 IMPLANT
GOWN STRL REUS W/TWL LRG LVL3 (GOWN DISPOSABLE) ×2
GOWN STRL REUS W/TWL XL LVL3 (GOWN DISPOSABLE) ×6
IV NS 1000ML (IV SOLUTION) ×2
IV NS 1000ML BAXH (IV SOLUTION) ×1 IMPLANT
K-WIRE SMTH SNGL TROCAR .028X4 (WIRE) ×3
KIT BASIN OR (CUSTOM PROCEDURE TRAY) ×3 IMPLANT
KIT TURNOVER KIT B (KITS) ×3 IMPLANT
KWIRE SMTH SNGL TROCAR .028X4 (WIRE) IMPLANT
NEEDLE 22X1 1/2 (OR ONLY) (NEEDLE) ×2 IMPLANT
NS IRRIG 1000ML POUR BTL (IV SOLUTION) ×3 IMPLANT
PAD ARMBOARD 7.5X6 YLW CONV (MISCELLANEOUS) ×3 IMPLANT
PENCIL BUTTON HOLSTER BLD 10FT (ELECTRODE) ×2 IMPLANT
PLATE BONE 8MMX6 0.6MM RIGHT (Plate) ×4 IMPLANT
PLATE MID FACE 6H 12M 100D LFT (Plate) ×4 IMPLANT
PLATE MNDBLE STRAIGHT 4 H LONG (Plate) ×4 IMPLANT
POSITIONER HEAD DONUT 9IN (MISCELLANEOUS) ×2 IMPLANT
SCREW AXS 1.7X5 (Screw) ×40 IMPLANT
SCREW BONE CROSS PIN 2.0X05MM (Screw) ×8 IMPLANT
SCREW MNDBLE 2.0X4 BONE (Screw) ×8 IMPLANT
SCREW MNDBLE 2.3X6MM BONE (Screw) ×2 IMPLANT
SPLINT ORTHOGNATHIC BUNDLE (STENTS) ×2 IMPLANT
SPONGE NEURO XRAY DETECT 1X3 (DISPOSABLE) ×2 IMPLANT
SPONGE T-LAP 18X18 ~~LOC~~+RFID (SPONGE) ×2 IMPLANT
SUT CHROMIC 3 0 PS 2 (SUTURE) ×1 IMPLANT
SUT CHROMIC 4 0 PS 2 18 (SUTURE) ×1 IMPLANT
SUT VIC AB 0 CT1 27 (SUTURE) ×2
SUT VIC AB 0 CT1 27XBRD ANBCTR (SUTURE) IMPLANT
SUT VIC AB 4-0 SH 18 (SUTURE) ×2 IMPLANT
SYR BULB EAR ULCER 3OZ GRN STR (SYRINGE) ×2 IMPLANT
SYR CONTROL 10ML LL (SYRINGE) ×3 IMPLANT
TRAY ENT MC OR (CUSTOM PROCEDURE TRAY) ×3 IMPLANT
YANKAUER SUCT BULB TIP NO VENT (SUCTIONS) ×3 IMPLANT

## 2021-06-10 NOTE — Anesthesia Preprocedure Evaluation (Signed)
Anesthesia Evaluation  Patient identified by MRN, date of birth, ID band Patient awake    Reviewed: Allergy & Precautions, H&P , NPO status , Patient's Chart, lab work & pertinent test results  Airway Mallampati: II   Neck ROM: full    Dental   Pulmonary sleep apnea ,    breath sounds clear to auscultation       Cardiovascular hypertension,  Rhythm:regular Rate:Normal     Neuro/Psych PSYCHIATRIC DISORDERS Anxiety Depression    GI/Hepatic   Endo/Other    Renal/GU      Musculoskeletal   Abdominal   Peds  Hematology   Anesthesia Other Findings   Reproductive/Obstetrics                             Anesthesia Physical Anesthesia Plan  ASA: 2  Anesthesia Plan: General   Post-op Pain Management:    Induction: Intravenous  PONV Risk Score and Plan: 2 and Ondansetron, Dexamethasone, Midazolam and Treatment may vary due to age or medical condition  Airway Management Planned: Nasal ETT  Additional Equipment:   Intra-op Plan:   Post-operative Plan: Extubation in OR  Informed Consent: I have reviewed the patients History and Physical, chart, labs and discussed the procedure including the risks, benefits and alternatives for the proposed anesthesia with the patient or authorized representative who has indicated his/her understanding and acceptance.     Dental advisory given  Plan Discussed with: CRNA, Anesthesiologist and Surgeon  Anesthesia Plan Comments:         Anesthesia Quick Evaluation

## 2021-06-10 NOTE — Transfer of Care (Signed)
Immediate Anesthesia Transfer of Care Note  Patient: John Faulkner  Procedure(s) Performed: MANDIBULAR OSTEOTOMY LEFORT 1 MAXILLARY OSTEOTOMY  Patient Location: PACU  Anesthesia Type:General  Level of Consciousness: awake and drowsy  Airway & Oxygen Therapy: Patient Spontanous Breathing and Patient connected to face mask oxygen  Post-op Assessment: Report given to RN and Post -op Vital signs reviewed and stable  Post vital signs: Reviewed and stable  Last Vitals:  Vitals Value Taken Time  BP 138/72 06/10/21 1307  Temp 37.5 C 06/10/21 1305  Pulse 87 06/10/21 1316  Resp 11 06/10/21 1316  SpO2 100 % 06/10/21 1316  Vitals shown include unvalidated device data.  Last Pain:  Vitals:   06/10/21 0621  TempSrc: Oral  PainSc: 0-No pain         Complications: No notable events documented.

## 2021-06-10 NOTE — Anesthesia Procedure Notes (Signed)
Procedure Name: Intubation Date/Time: 06/10/2021 7:43 AM Performed by: Epifanio Lesches, CRNA Pre-anesthesia Checklist: Patient identified, Emergency Drugs available, Suction available and Patient being monitored Patient Re-evaluated:Patient Re-evaluated prior to induction Oxygen Delivery Method: Circle System Utilized Preoxygenation: Pre-oxygenation with 100% oxygen Induction Type: IV induction Ventilation: Mask ventilation without difficulty Laryngoscope Size: Glidescope and 4 Grade View: Grade I Tube type: Oral Nasal Tubes: Right, Nasal prep performed and Nasal Rae Tube size: 7.5 mm Number of attempts: 1 Airway Equipment and Method: Stylet and Oral airway Placement Confirmation: ETT inserted through vocal cords under direct vision, positive ETCO2 and breath sounds checked- equal and bilateral Secured at: 27 cm Tube secured with: Tape Dental Injury: Teeth and Oropharynx as per pre-operative assessment

## 2021-06-10 NOTE — H&P (Signed)
History and Physical    John Faulkner ZOX:[id number] DOB: 1986/05/27 DOA: 06/10/2021  PCP: Sandford Craze, NP Consultants:  Fredricka Bonine - surgery; Cleatrice Burke - urology; [place] - oral surgery Patient coming from:  Home - lives with wife; NOK: Wife, John Faulkner, [phone]  Chief Complaint: oral surgery  HPI: John Faulkner is a 35 y.o. male with medical history significant of depression/anxiety/ADHD, OSA not on CPAP, gastric sleeve, and HTN presenting for mandibular osteotomy.   Patient had gastric sleeve surgery about 9 months ago and has done well, no longer needing CPAP.  He had a severe underbite with difficulty eating and so came in for osteotomy.  He is currently experiencing pain in the PACU despite his nerve block.  He had pre-procedure braces and understands that the rubber bands were not placed in the OR today (maybe tomorrow).      ED Course: No OSA symptoms.  Had an osteotomy - disarticulated upper and lower jaws and plated in new position.  Has braces, not wired shut.  Give Decadron, will need 2 more doses 10 mg q8h.  Continue Ancef 1 gram.  Pain control. Toradol and Tylenol, oxy for breakthrough.  Clear liquids for today, oral surgery will progress tomorrow.  Review of Systems: As per HPI; otherwise review of systems reviewed and negative. Very limited by post-operative state  Ambulatory Status:  Ambulates without assistance  COVID Vaccine Status:   Complete plus booster  Past Medical History:  Diagnosis Date   Acute calculous cholecystitis 08/13/2011   Anxiety    Depression    Elevated blood pressure    Hypertension    Nephrotic syndrome 1989   Sleep apnea     Past Surgical History:  Procedure Laterality Date   CARPAL TUNNEL RELEASE Left    CHOLECYSTECTOMY  08/13/2011   Procedure: LAPAROSCOPIC CHOLECYSTECTOMY WITH INTRAOPERATIVE CHOLANGIOGRAM;  Surgeon: Rulon Abide, DO;  Location: [facility] OR;  Service: General;  Laterality: N/A;      LAPAROSCOPIC  GASTRIC SLEEVE RESECTION N/A 10/10/2020   Procedure: LAPAROSCOPIC GASTRIC SLEEVE RESECTION;  Surgeon: Berna Bue, MD;  Location: [facility] ORS;  Service: General;  Laterality: N/A;   TONSILLECTOMY  06/26/1999   UPPER GI ENDOSCOPY N/A 10/10/2020   Procedure: UPPER GI ENDOSCOPY;  Surgeon: Berna Bue, MD;  Location: [facility];  Service: General;  Laterality: N/A;    Social History   Socioeconomic History   Marital status: Married    Spouse name: Not on file   Number of children: Not on file   Years of education: Not on file   Highest education level: Not on file  Occupational History   Not on file  Tobacco Use   Smoking status: Never   Smokeless tobacco: Never  Vaping Use   Vaping Use: Never used  Substance and Sexual Activity   Alcohol use: No   Drug use: No   Sexual activity: Not on file  Other Topics Concern   Not on file  Social History Narrative   Work or School:       Home Situation: lives with wife, 2 children   Daughter 2014   Son 2018      Has a cat a dog      Spiritual Beliefs: none      Lifestyle: no regular exercise; poor      Works for a Tax inspector      Social Determinants of Corporate investment banker Strain: Not on file  Food Insecurity: Not  on file  Transportation Needs: Not on file  Physical Activity: Not on file  Stress: Not on file  Social Connections: Not on file  Intimate Partner Violence: Not on file    No Known Allergies  Family History  Problem Relation Age of Onset   Pneumonia Mother        covid 16 pneumonia   COPD Father    Diabetes Father    Heart disease Father 57   Stroke Father    Diabetes Maternal Grandfather    Diabetes Paternal Grandfather     Prior to Admission medications   Medication Sig Start Date End Date Taking? Authorizing Provider  amphetamine-dextroamphetamine (ADDERALL XR) 10 MG 24 hr capsule Take 1 capsule (10 mg total) by mouth daily. 05/29/21  Yes Sandford Craze, NP   amphetamine-dextroamphetamine (ADDERALL) 5 MG tablet Take 1 tab by mouth once daily in the afternoon 05/29/21  Yes Sandford Craze, NP  anastrozole (ARIMIDEX) 1 MG tablet Take 1 mg by mouth every Monday. 05/30/21  Yes [provider]  clomiPHENE (CLOMID) 50 MG tablet Take 25 mg by mouth daily. 01/26/21  Yes [provider]  DHEA 50 MG CAPS Take 50 mg by mouth 3 (three) times a week.   Yes [provider]  escitalopram (LEXAPRO) 10 MG tablet TAKE 1 TABLET(10 MG) BY MOUTH DAILY 02/20/21  Yes Sandford Craze, NP  Multiple Vitamins-Minerals (MULTIVITAMIN WITH MINERALS) tablet Take 1 tablet by mouth daily.   Yes [provider]  OVER THE COUNTER MEDICATION Take 1 capsule by mouth daily. DIM   Yes [provider]    Physical Exam: Vitals:   06/10/21 1340 06/10/21 1345 06/10/21 1400 06/10/21 1415  BP: 135/74 (!) 141/75 138/75   Pulse: 75 74 71 60  Resp: (!) 8 12 13 11   Temp:      TempSrc:      SpO2: 97% 98% 97% 97%  Weight:      Height:         General:  Appears calm and comfortable and is in NAD; ice pack surrounding his face Eyes:   EOMI, normal lids, iris ENT:  grossly normal hearing, edematous lips with blood tinge; braces in place Neck:  no LAD, masses or thyromegaly Cardiovascular:  RRR, no m/r/g. No LE edema.  Respiratory:   CTA bilaterally with no wheezes/rales/rhonchi.  Normal respiratory effort. Abdomen:  soft, NT, ND Skin:  no rash or induration seen on limited exam Musculoskeletal:  no bony abnormality, SCDs in place Psychiatric:  blunted mood and affect, speech sparse but appropriate Neurologic:  unable to effectively perform    Radiological Exams on Admission: Independently reviewed - see discussion in A/P where applicable  No results found.  EKG: none   Labs on Admission: I have personally reviewed the available labs and imaging studies at the time of the admission.  Pertinent labs:   None today, unremarkable  on 12/14 other than hypoglycemia (also present on 12/5)   Assessment/Plan Principal Problem:   Maxillary hypoplasia Active Problems:   Hypertension   ADHD (attention deficit hyperactivity disorder)   Depression with anxiety   S/P gastric sleeve procedure   Hypogonadism in male   Oral surgery -Patient with extensive procedure today for maxillary hypoplasia and mandibular hyperplasia, now s/p osteotomy -Will admit to Med Surg -Likely to need pain control beyond block - will give Tylenol, Toradol, Oxy, and morphine as needed -Continue Ancef as per [facility] Dr. 14/5 -Continue Decadron x 3 doses -Clear liquids for now  Recent gastric bypass -Patient with appropriate weight loss, no longer obese -He has had hypoglycemia on his last 2 blood draws -Needs to ensure adequate intake when able to eat again -Consider nutrition consult prior to dc, although he has been previously seeing nutrition as an outpatient so this is also an option  HTN -Appears to no longer be taking medications for this issue  Hypogonadism -He currently has Clomid listed on his Med Rec, although it appears that this medication may have been stopped by urology over the summer -He also has Arimidex listed; this is an off-label use of the medication and there are no recent chart notes to indicate that this is currently prescribed -Testosterone is not included on his Med Rec and he appears to have been on this for years -For now, none of these medications is indicated in the hospital -Can discuss with urology and/or PCP after d/c  ADHD/Mood d/o -Continue Lexapro -ADHD: Hold medications - he will not be performing tasks that require attention and focus as an inpatient     Note: This patient has been tested and is negative for the novel coronavirus COVID-19. The patient has been fully vaccinated against COVID-19.   Level of care: Med-Surg DVT prophylaxis:  SCDs Code Status:  Full  Family Communication: None  present Disposition Plan:  The patient is from: home  Anticipated d/c is to: home without [facility] services   Anticipated d/c date will depend on clinical response to treatment, likely 2-3 days  Patient is currently: acutely ill Consults called: Oral surgery  Admission status:  Admit - It is my clinical opinion that admission to INPATIENT is reasonable and necessary because of the expectation that this patient will require hospital care that crosses at least 2 midnights to treat this condition based on the medical complexity of the problems presented.  Given the aforementioned information, the predictability of an adverse outcome is felt to be significant.    Jonah Blue MD [facility]   How to contact the [facility] Attending or Consulting provider 7A - 7P or covering provider during after hours 7P -7A, for this patient?  Check the care team in [facility] and look for a) attending/consulting TRH provider listed and b) the [facility] team listed Log into [email or web address] and use [place]'s universal password to access. If you do not have the password, please contact the hospital operator. Locate the [facility] provider you are looking for under [facility] and page to a number that you can be directly reached. If you still have difficulty reaching the provider, please page the [facility] (Director on Call) for the Hospitalists listed on amion for assistance.   06/10/2021, 2:18 PM

## 2021-06-10 NOTE — Interval H&P Note (Signed)
History and Physical Interval Note:  06/10/2021 7:21 AM  John Faulkner  has presented today for surgery, with the diagnosis of MAXILLARY HYPOPLASIA.  The various methods of treatment have been discussed with the patient and family. After consideration of risks, benefits and other options for treatment, the patient has consented to  Procedure(s): MANDIBULAR OSTEOTOMY (N/A) LEFORT 1 MAXILLARY OSTEOTOMY (N/A) as a surgical intervention.  The patient's history has been reviewed, patient examined, no change in status, stable for surgery.  I have reviewed the patient's chart and labs.  Questions were answered to the patient's satisfaction.     Exie Parody

## 2021-06-10 NOTE — Op Note (Signed)
OPERATIVE REPORT  Patient: John Faulkner DOB: 07-21-1985  MRN: [id number] Date: 06/10/2021 Surgeon: Dr. Tennis Ship Co-Surgeon: Dr. Herbie Saxon Assistant: Thereasa Solo  Pre-operative Diagnosis: 1) Maxillary hypoplasia 2) Mandibular hyperplasia  Post-Operative Diagnosis: 1) Maxillary hypoplasia 2) Mandibular hyperplasia  EBL: 600cc Complications: none   DESCRIPTION OF PROCEDURE: The patient was brought to the operating room and placed supine on the operating room table. The patient was nasotracheally intubated, then prepped and draped in the usual sterile fashion for the surgical procedure. Surgeons donned sterile gloves and gowns. A moistened throat pack was placed in the posterior oropharynx and 10cc of 2% Lidocaine with 1:100,000 Epinephrine was administered via local infiltration. A K-wire was placed into the radix for a fixed reference point for vertical measurement of the position in the maxilla. Any occlusal adjustments were completed as planned using a diamond round bur under irrigation to ensure adequate fit of the splints, with complete seating and no rocking.  Attention was then turned to the mandible where we began on the left side with mucosal incision using #15 blade just lateral to the anterior border of the mandibular ramus, starting at the height of the maxillary occlusal plane and extending inferiorly, following the external oblique ridge, maintaining at least 69mm distance from the mucogingival junction, and stopping adjacent to the mandibular 1st molar. The incision was carried through submucosal tissue, muscle, and periosteum, directed to the lateral surface of the mandible. Subperiosteal dissection was performed, exposing the inferior border of the mandible and the lateral aspect of the ramus. Then, the medial aspect of the ramus was dissected, creating a subperiosteal pocket on the lingual side of the mandible from the third molar region extending superiorly. A  Seldin retractor was inserted above the lingula and carried posteriorly and inferiorly, until we visualized the inferior alveolar nerve entering the mandible. We used the reciprocating saw to create a medial cut above the lingula/nerve, angled inferiorly 45 degrees from horizontal and extending halfway into the mandibular ramus width and 3/4 of the A-P dimension. Then, a sagittal cut was started from the end of the medial cut using a piezo handpiece, extending anteriorly keeping parallel to the contour of the lateral cortex of the mandible, extending to the second molar region, and then carrying anteriorly and inferiorly to the inferior border of the mandible. We widened the inferior border of the mandible with a 702 bur and then the cut was completed with the Piezo, all protecting the medial side of the mandible with a channel retractor. At that point, we repeated the same exact sequence on the other side.  Before finishing the right side mandibular osteotomy, we injected 10 mL of 1% lidocaine with 1:100,000 epinephrine in the maxillary vestibule and finished the mandibular osteotomy and then started our incision in the maxilla.  A circumvestibular incision was made in the maxilla from premolar to premolar using 15 blade through mucosa, submucosa, muscle and down through the periosteum, and a full-thickness mucoperiosteal flap was elevated to expose the piriform rim, as well as the zygomaticomaxillary buttresses, and then the pterygomaxillary fissure in the posterior. The infraorbital nerve was identified and protected throughout the procedure and completely intact. A minimum of 5 mm cuff of mucosal tissue was left for closure superior to the mucogingival junction. Then using a small Molt curette, we dissected the nasal apertures, then used a large Molt curette, and then sequentially to a Freer to elevate the nasal mucosa from the bony floor and walls, as well as the nasal  septum. Then a Seldin retractor was  inserted between the nasal mucosa and piriform rim to protect the nasal mucosa and our LeFort I osteotomy was made using a reciprocating saw under irrigation from the zygomaticomaxillary buttress through the piriform rims on the left and the right sides. Once that was completed, using a double beaded osteotome, we separated the septum and the vomer from the maxilla. Then, using a spatula osteotome and mallet, we deepened our osteotomy into the lateral nasal wall and then the down to the pterygoid plates. Then, using a single-beaded osteotome, we expanded our osteotomy in the lateral nasal wall. We then used a curved osteotome directed inferomedially into the pterygomaxillary fissure to complete the osteotomy through the pterygoid plates, palpating the opposing soft palate mucosa to feel mobility of the plates. Once that was completed, using manual digital pressure, we down fractured the maxilla. Once we downfractured the maxilla, we used a side action to ensure that there were no interferences. Using a rongeur, we removed all of the bony interferences in the lateral nasal walls, the nasal septum, as well as the bony nasal floor. At this time, the descending palatine arteries were identified bilaterally and due to the anticipated tension of the posterior soft tissues that will ensue with the advancement, we opted to cauterize the arteries bilaterally using Bovie electrocautery. The maxilla was then seated into the intermediate splint and the vertical dimension was checked with a Boley gauge prior to securing the maxilla with plates and screws. The maxilla was secured to the intermediate splint with elastics and the mandible was held in centric relation with bony contacts at the zygomaticomaxillary buttressed bilaterally. We then reapproximated the tissue, and his nasal tip and projection was appropriate, as planned pre surgically. Stryker fixation plates and screws were bent and used bilaterally to fixate the maxilla  in the planned position. On each of the right and left sides, we used a 5-hole L plate at each piriform rim and at each zygomaticomaxillary buttress for fixation. The right piriform plate was fixated with 1.7x22mm screws x4, the right zygoma plate was fixated with 4 1.7x28mm screws, the left piriform plate was was fixated with 4 1.7x18mm screws, and the left zygoma plate was fixated with 4 1.7x37mm screws. Once this was completed, the patient was released from MMF and the position of the maxilla was rechecked, and it was as planned preoperatively, stable, with occlusion biting passively and reproducibly into the intermediate splint.  At this point, we returned to the mandible to complete our osteotomies, starting on the right side. Using a series of osteotomes in a controlled fashion, we extended through cortex the length of the osteotomy, watching it propagate in a controlled and uniform fashion until the mandible was finally carefully split, simultaneously using a Smith spreader at the superior border and flag elevator at the inferior border cut. With the nerve and teeth in the distal segment with the and the condyle in the proximal segment, the segments were freely mobile. The same exact sequence was repeated on the right side. The patient was put back in maxillomandibular fixation, this time with the final splint in place, and overlap/interference between the proximal and distal segments were observed and addressed, trimming the proximal segments using the piezo handpiece until they seated passively against the distal segments. We seated the condyles by aligning the inferior borders of each segment with a toe-out retractor and pushing posteriorly and superiorly on the proximal segment with a Government social research officer. Then we plated the  mandible on the right side first with 4-hole plate with a gap, fixating it with 2 2.0x16mm screws and 2 2.0x36mm screws. We fixated a 6 hole straight chain plate to the left mandible using  identical screws. At that point, we irrigated the surgical sites with copious amounts of normal saline and secured Stryker bone sponges in the gaps between the maxilla and midface bilaterally. We then began closing the sites with sutures. An alar cinch 0 Vicryl stitch was placed to reapproximate the alar base width, we closed the circumvestibular maxillary incision with running 4-0 Vicryl sutures and an interrupted suture reapproximating the midline. The mandibular incisions were closed with the same type of running sutures.  At the end of the procedure, the mouth was then irrigated again and oropharynx suctioned, and the moistened 4 x 4 throat pack was removed, and an orogastric tube was then passed to suction the contents of the stomach. Once the procedure was completed. The patient was handed over to the anesthesia team for extubation and postoperative recovery.

## 2021-06-11 ENCOUNTER — Inpatient Hospital Stay (HOSPITAL_COMMUNITY): Payer: BC Managed Care – PPO

## 2021-06-11 DIAGNOSIS — M2602 Maxillary hypoplasia: Secondary | ICD-10-CM | POA: Diagnosis not present

## 2021-06-11 DIAGNOSIS — S02609A Fracture of mandible, unspecified, initial encounter for closed fracture: Secondary | ICD-10-CM | POA: Diagnosis not present

## 2021-06-11 DIAGNOSIS — E291 Testicular hypofunction: Secondary | ICD-10-CM | POA: Diagnosis not present

## 2021-06-11 DIAGNOSIS — I10 Essential (primary) hypertension: Secondary | ICD-10-CM | POA: Diagnosis not present

## 2021-06-11 DIAGNOSIS — M2603 Mandibular hyperplasia: Secondary | ICD-10-CM | POA: Diagnosis not present

## 2021-06-11 DIAGNOSIS — Z79899 Other long term (current) drug therapy: Secondary | ICD-10-CM | POA: Diagnosis not present

## 2021-06-11 LAB — BASIC METABOLIC PANEL
Anion gap: 6 (ref 5–15)
BUN: 8 mg/dL (ref 6–20)
CO2: 24 mmol/L (ref 22–32)
Calcium: 8.7 mg/dL — ABNORMAL LOW (ref 8.9–10.3)
Chloride: 105 mmol/L (ref 98–111)
Creatinine, Ser: 0.78 mg/dL (ref 0.61–1.24)
GFR, Estimated: >60 mL/min (ref >60)
Glucose, Bld: 161 mg/dL — ABNORMAL HIGH (ref 70–99)
Potassium: 4.3 mmol/L (ref 3.5–5.1)
Sodium: 135 mmol/L (ref 135–145)

## 2021-06-11 LAB — CBC
HCT: 39 % (ref 39.0–52.0)
Hemoglobin: 13.2 g/dL (ref 13.0–17.0)
MCH: 31.4 pg (ref 26.0–34.0)
MCHC: 33.8 g/dL (ref 30.0–36.0)
MCV: 92.9 fL (ref 80.0–100.0)
Platelets: 151 10*3/uL (ref 150–400)
RBC: 4.2 MIL/uL — ABNORMAL LOW (ref 4.22–5.81)
RDW: 12.1 % (ref 11.5–15.5)
WBC: 16.7 10*3/uL — ABNORMAL HIGH (ref 4.0–10.5)
nRBC: 0 % (ref 0.0–0.2)

## 2021-06-11 MED ORDER — IBUPROFEN 100 MG/5ML PO SUSP: 600.0000 mg | Freq: Four times a day (QID) | ORAL | 0 refills | Status: AC | PRN

## 2021-06-11 MED ORDER — ACETAMINOPHEN 160 MG/5ML PO SOLN: 650.0000 mg | Freq: Four times a day (QID) | ORAL | Status: DC | PRN

## 2021-06-11 MED ORDER — PANTOPRAZOLE SODIUM 40 MG PO TBEC: 40.0000 mg | DELAYED_RELEASE_TABLET | Freq: Every day | ORAL | Status: DC

## 2021-06-11 MED ORDER — DOCUSATE SODIUM 50 MG/5ML PO LIQD
100.0000 mg | Freq: Two times a day (BID) | ORAL | Status: DC
  Administered 2021-06-11: 09:00:00 100 mg via ORAL
  Filled 2021-06-11: qty 10

## 2021-06-11 MED ORDER — CHLORHEXIDINE GLUCONATE 0.12 % MT SOLN: 15.0000 mL | Freq: Two times a day (BID) | OROMUCOSAL | 0 refills | Status: DC

## 2021-06-11 MED ORDER — AMOXICILLIN 250 MG/5ML PO SUSR: 500.0000 mg | Freq: Three times a day (TID) | ORAL | 0 refills | Status: AC

## 2021-06-11 MED ORDER — CHLORHEXIDINE GLUCONATE 0.12 % MT SOLN
15.0000 mL | Freq: Two times a day (BID) | OROMUCOSAL | Status: DC
  Administered 2021-06-11: 11:00:00 15 mL via OROMUCOSAL
  Filled 2021-06-11: qty 15

## 2021-06-11 MED ORDER — OXYCODONE HCL 5 MG/5ML PO SOLN: 5.0000 mg | Freq: Four times a day (QID) | ORAL | 0 refills | Status: AC | PRN

## 2021-06-11 MED ORDER — OXYCODONE HCL 5 MG/5ML PO SOLN: 5.0000 mg | Freq: Four times a day (QID) | ORAL | Status: DC | PRN

## 2021-06-11 MED ORDER — PANTOPRAZOLE SODIUM 40 MG PO PACK: 40.0000 mg | PACK | Freq: Every day | ORAL | 0 refills | Status: DC

## 2021-06-11 MED ORDER — DOCUSATE SODIUM 50 MG/5ML PO LIQD: 100.0000 mg | Freq: Two times a day (BID) | ORAL | 0 refills | Status: DC | PRN

## 2021-06-11 MED ORDER — PANTOPRAZOLE 2 MG/ML SUSPENSION
40.0000 mg | Freq: Every day | ORAL | Status: DC
  Administered 2021-06-11: 09:00:00 40 mg via ORAL
  Filled 2021-06-11 (×2): qty 20

## 2021-06-11 NOTE — Social Work (Signed)
°  Transition of Care [facility]) Screening Note   Patient Details  Name: John Faulkner Date of Birth: [birth year]   [facility] of [facility]) CM/SW Contact:    Jimmy Picket, LCSW Phone Number: 06/11/2021, 9:17 AM    Transition of Care Department [facility]) has reviewed patient and no TOC needs have been identified at this time. We will continue to monitor patient advancement through interdisciplinary progression rounds. If new patient transition needs arise, please place a TOC consult.

## 2021-06-11 NOTE — Discharge Instructions (Signed)
Maintain a liquid diet for 6 weeks Rinse with chlorhexidine mouthrinse twice daily Avoid contact to the face  Ice to face for next 48 hours Use saline nasal spray as needed for nasal congestion  Follow up with Dr. Ross Marcus in 1 week Contact the office if develop fever > 101

## 2021-06-11 NOTE — Progress Notes (Signed)
1 Day Post-Op   Subjective/Chief Complaint: POD1 s/p Lefort I, bilateral sagittal split osteotomies. Reports pain well controlled, has been ambulating, taking PO fluid and voiding regularly. NAEON.    Objective: Vital signs in last 24 hours: Temp:  [97.6 F (36.4 C)-99.5 F (37.5 C)] 97.9 F (36.6 C) (12/18 0748) Pulse Rate:  [50-86] 59 (12/18 0748) Resp:  [8-19] 19 (12/18 0748) BP: (111-149)/(58-99) 111/58 (12/18 0748) SpO2:  [97 %-100 %] 100 % (12/18 0748)    Intake/Output from previous day: 12/17 0701 - 12/18 0700 In: 3851.1 [I.V.:3451.1; IV Piggyback:400] Out: 900 [Urine:400; Blood:500] Intake/Output this shift: No intake/output data recorded.  Maxillofacial Exam: Moderate edema bilateral middle and lower facial 1/3 Mild ecchymosis left lower 1/3 Bilateral V2, V3 paresthesia as expected Intraorally, incisions c/d/I with sutures in place, no evidence of hematoma Occlusion with slight open bite left posterior Maxillary gingiva pink with good capillary refill  Lab Results:  Recent Labs    06/11/21 0033  WBC 16.7*  HGB 13.2  HCT 39.0  PLT 151   BMET Recent Labs    06/11/21 0033  NA 135  K 4.3  CL 105  CO2 24  GLUCOSE 161*  BUN 8  CREATININE 0.78  CALCIUM 8.7*   PT/INR No results for input(s): LABPROT, INR in the last 72 hours. ABG No results for input(s): PHART, HCO3 in the last 72 hours.  Invalid input(s): PCO2, PO2  Studies/Results: No results found.  Anti-infectives: Anti-infectives (From admission, onward)    Start     Dose/Rate Route Frequency Ordered Stop   06/10/21 1400  ceFAZolin (ANCEF) IVPB 1 g/50 mL premix        1 g 100 mL/hr over 30 Minutes Intravenous Every 8 hours 06/10/21 1358 06/10/21 1609   06/10/21 0615  ceFAZolin (ANCEF) IVPB 2g/100 mL premix        2 g 200 mL/hr over 30 Minutes Intravenous On call to O.R. 06/10/21 0611 06/10/21 1215   06/10/21 0607  ceFAZolin (ANCEF) 2-4 GM/100ML-% IVPB       Note to Pharmacy: Effie Shy: cabinet override      06/10/21 0607 06/10/21 0757       Assessment/Plan: s/p Procedure(s): MANDIBULAR OSTEOTOMY (N/A) LEFORT 1 MAXILLARY OSTEOTOMY (N/A) POD1 s/p Lefort I, BSSO progressing well. Leukocytosis and hyperglycemia likely 2/2 high steroid dosages and surgical stress, no sign of infection currently. Elastics placed this morning to aid in capturing occlusion. He is tolerating PO intake, ambulating and voiding. We discussed utilizing a syringe and red ruber catheter to aid in nutrition if needed. Recommend discharge to home today. Will follow up with me in 1 week in my office. We will contact patient to make appointment.   Discharge recommendations: 1) Amoxicillin 500mg  TID (LIQUID FORMULATION) 2) IBU 600mg  q6h (LIQUID FORMULATION) 3) Oxycodone 5-10mg  prn (LIQUID FORMULATION) 4) Chlorhexidine mouthrinse BID  Will maintain liquid diet for the next 6 weeks. Will change elastics in the office.   LOS: 1 day    06/11/2021

## 2021-06-11 NOTE — Discharge Summary (Signed)
Physician Discharge Summary  John Faulkner PXT:[id number] DOB: [birth year] DOA: 06/10/2021  PCP: Sandford Craze, NP  Admit date: 06/10/2021 Discharge date: 06/11/2021  Admitted From: Home  Disposition:  Home   Recommendations for Outpatient Follow-up:  Follow up with PCP in 1-2 weeks Please obtain BMP/CBC in one week Needs B 12 level.  Follow with Orofacial Surgeon.     Discharge Condition: Stable.  CODE STATUS: Full code Diet recommendation: Heart Healthy   Brief/Interim Summary: 35 year old past medical history significant for depression, anxiety, ADHD, OSA not on CPAP, gastric sleeve, hypertension presenting for mandibular osteotomy.  He was admitted overnight from PACU for pain control.  She received IV Decadron, IV opioids, he was a started on clear diet.  Patient is doing well today, he is tolerating clear, his pain is controlled.  He has been cleared for discharge today, by surgeon.  He will be discharged on amoxicillin, ibuprofen, couple of days of oxycodone.  He will resume his chronic medication.       Discharge Diagnoses:  Principal Problem:   Maxillary hypoplasia Active Problems:   Hypertension   ADHD (attention deficit hyperactivity disorder)   Depression with anxiety   S/P gastric sleeve procedure   Hypogonadism in male    Discharge Instructions  Discharge Instructions     Diet - low sodium heart healthy   Complete by: As directed    Diet clear liquid   Complete by: As directed    Discharge wound care:   Complete by: As directed    See maxillo facial recommendation   Increase activity slowly   Complete by: As directed       Allergies as of 06/11/2021   No Known Allergies      Medication List     TAKE these medications    amoxicillin 250 MG/5ML suspension Commonly known as: AMOXIL Take 10 mLs (500 mg total) by mouth 3 (three) times daily for 7 days.   amphetamine-dextroamphetamine 5 MG tablet Commonly known as:  Adderall Take 1 tab by mouth once daily in the afternoon   amphetamine-dextroamphetamine 10 MG 24 hr capsule Commonly known as: Adderall XR Take 1 capsule (10 mg total) by mouth daily.   anastrozole 1 MG tablet Commonly known as: ARIMIDEX Take 1 mg by mouth every Monday.   chlorhexidine 0.12 % solution Commonly known as: PERIDEX 15 mLs by Mouth Rinse route 2 (two) times daily.   clomiPHENE 50 MG tablet Commonly known as: CLOMID Take 25 mg by mouth daily.   DHEA 50 MG Caps Take 50 mg by mouth 3 (three) times a week.   docusate 50 MG/5ML liquid Commonly known as: COLACE Take 10 mLs (100 mg total) by mouth 2 (two) times daily as needed for mild constipation.   escitalopram 10 MG tablet Commonly known as: LEXAPRO TAKE 1 TABLET(10 MG) BY MOUTH DAILY   ibuprofen 100 MG/5ML suspension Commonly known as: ADVIL Take 30 mLs (600 mg total) by mouth every 6 (six) hours as needed for up to 4 days for moderate pain.   multivitamin with minerals tablet Take 1 tablet by mouth daily.   OVER THE COUNTER MEDICATION Take 1 capsule by mouth daily. DIM   oxyCODONE 5 MG/5ML solution Commonly known as: ROXICODONE Take 5 mLs (5 mg total) by mouth every 6 (six) hours as needed for up to 3 days for severe pain.   pantoprazole sodium 40 mg Commonly known as: PROTONIX Take 40 mg by mouth daily.  Discharge Care Instructions  (From admission, onward)           Start     Ordered   06/11/21 0000  Discharge wound care:       Comments: See maxillo facial recommendation   06/11/21 0859            Follow-up Information     Sandford Craze, NP Follow up in 1 week(s).   Specialty: Internal Medicine Contact information: [address] [place] [address] [phone]         Exie Parody, DMD Follow up in 1 week(s).   Specialty: Oral Surgery Contact information: [address] [place] [address] [phone]                 No Known Allergies  Consultations: Dr Ross Marcus.   Procedures/Studies: No results found.   Subjective: He report pain is controlled, tolerating clears  Discharge Exam: Vitals:   06/11/21 0452 06/11/21 0748  BP: (!) 116/58 (!) 111/58  Pulse: (!) 52 (!) 59  Resp: 16 19  Temp: 97.6 F (36.4 C) 97.9 F (36.6 C)  SpO2: 99% 100%     General: Pt is alert, awake, not in acute distress Head: facial swelling Cardiovascular: RRR, S1/S2 +, no rubs, no gallops Respiratory: CTA bilaterally, no wheezing, no rhonchi Abdominal: Soft, NT, ND, bowel sounds + Extremities: no edema, no cyanosis    The results of significant diagnostics from this hospitalization (including imaging, microbiology, ancillary and laboratory) are listed below for reference.     Microbiology: Recent Results (from the past 240 hour(s))  SARS CORONAVIRUS 2 (TAT 6-24 HRS) Nasopharyngeal Nasopharyngeal Swab     Status: None   Collection Time: 06/07/21  9:00 AM   Specimen: Nasopharyngeal Swab  Result Value Ref Range Status   SARS Coronavirus 2 NEGATIVE NEGATIVE Final    Comment: (NOTE) SARS-CoV-2 target nucleic acids are NOT DETECTED.  The SARS-CoV-2 RNA is generally detectable in upper and lower respiratory specimens during the acute phase of infection. Negative results do not preclude SARS-CoV-2 infection, do not rule out co-infections with other pathogens, and should not be used as the sole basis for treatment or other patient management decisions. Negative results must be combined with clinical observations, patient history, and epidemiological information. The expected result is Negative.  Fact Sheet for Patients: HairSlick.no  Fact Sheet for Healthcare Providers: quierodirigir.com  This test is not yet approved or cleared by the [place] FDA and  has been authorized for detection and/or diagnosis of SARS-CoV-2 by FDA under  an Emergency Use Authorization (EUA). This EUA will remain  in effect (meaning this test can be used) for the duration of the COVID-19 declaration under Se ction 564(b)(1) of the Act, 21 U.S.C. section 360bbb-3(b)(1), unless the authorization is terminated or revoked sooner.  Performed at [facility], [address] [place], [place] [address]      Labs: BNP (last 3 results) No results for input(s): BNP in the last 8760 hours. Basic Metabolic Panel: Recent Labs  Lab 06/07/21 0900 06/11/21 0033  NA 140 135  K 3.6 4.3  CL 107 105  CO2 27 24  GLUCOSE 55* 161*  BUN 6 8  CREATININE 0.85 0.78  CALCIUM 9.2 8.7*   Liver Function Tests: No results for input(s): AST, ALT, ALKPHOS, BILITOT, PROT, ALBUMIN in the last 168 hours. No results for input(s): LIPASE, AMYLASE in the last 168 hours. No results for input(s):  AMMONIA in the last 168 hours. CBC: Recent Labs  Lab 06/07/21 0900 06/11/21 0033  WBC 4.3 16.7*  NEUTROABS 2.0  --   HGB 14.3 13.2  HCT 43.3 39.0  MCV 95.2 92.9  PLT 172 151   Cardiac Enzymes: No results for input(s): CKTOTAL, CKMB, CKMBINDEX, TROPONINI in the last 168 hours. BNP: Invalid input(s): POCBNP CBG: No results for input(s): GLUCAP in the last 168 hours. D-Dimer No results for input(s): DDIMER in the last 72 hours. Hgb A1c No results for input(s): HGBA1C in the last 72 hours. Lipid Profile No results for input(s): CHOL, HDL, LDLCALC, TRIG, CHOLHDL, LDLDIRECT in the last 72 hours. Thyroid function studies No results for input(s): TSH, T4TOTAL, T3FREE, THYROIDAB in the last 72 hours.  Invalid input(s): FREET3 Anemia work up No results for input(s): VITAMINB12, FOLATE, FERRITIN, TIBC, IRON, RETICCTPCT in the last 72 hours. Urinalysis No results found for: COLORURINE, APPEARANCEUR, LABSPEC, PHURINE, GLUCOSEU, HGBUR, BILIRUBINUR, KETONESUR, PROTEINUR, UROBILINOGEN, NITRITE, LEUKOCYTESUR Sepsis Labs Invalid input(s): PROCALCITONIN,  WBC,   LACTICIDVEN Microbiology Recent Results (from the past 240 hour(s))  SARS CORONAVIRUS 2 (TAT 6-24 HRS) Nasopharyngeal Nasopharyngeal Swab     Status: None   Collection Time: 06/07/21  9:00 AM   Specimen: Nasopharyngeal Swab  Result Value Ref Range Status   SARS Coronavirus 2 NEGATIVE NEGATIVE Final    Comment: (NOTE) SARS-CoV-2 target nucleic acids are NOT DETECTED.  The SARS-CoV-2 RNA is generally detectable in upper and lower respiratory specimens during the acute phase of infection. Negative results do not preclude SARS-CoV-2 infection, do not rule out co-infections with other pathogens, and should not be used as the sole basis for treatment or other patient management decisions. Negative results must be combined with clinical observations, patient history, and epidemiological information. The expected result is Negative.  Fact Sheet for Patients: HairSlick.no  Fact Sheet for Healthcare Providers: quierodirigir.com  This test is not yet approved or cleared by the [place] FDA and  has been authorized for detection and/or diagnosis of SARS-CoV-2 by FDA under an Emergency Use Authorization (EUA). This EUA will remain  in effect (meaning this test can be used) for the duration of the COVID-19 declaration under Se ction 564(b)(1) of the Act, 21 U.S.C. section 360bbb-3(b)(1), unless the authorization is terminated or revoked sooner.  Performed at [facility], [address], [place], [place] [address]      Time coordinating discharge: 40 minutes  SIGNED:   Alba Cory, MD  [facility]

## 2021-06-12 NOTE — Anesthesia Postprocedure Evaluation (Signed)
Anesthesia Post Note  Patient: John Faulkner  Procedure(s) Performed: MANDIBULAR OSTEOTOMY LEFORT 1 MAXILLARY OSTEOTOMY     Patient location during evaluation: PACU Anesthesia Type: General Level of consciousness: awake and alert Pain management: pain level controlled Vital Signs Assessment: post-procedure vital signs reviewed and stable Respiratory status: spontaneous breathing, nonlabored ventilation, respiratory function stable and patient connected to nasal cannula oxygen Cardiovascular status: blood pressure returned to baseline and stable Postop Assessment: no apparent nausea or vomiting Anesthetic complications: no   No notable events documented.  Last Vitals:  Vitals:   06/11/21 0452 06/11/21 0748  BP: (!) 116/58 (!) 111/58  Pulse: (!) 52 (!) 59  Resp: 16 19  Temp: 36.4 C 36.6 C  SpO2: 99% 100%    Last Pain:  Vitals:   06/11/21 1240  TempSrc:   PainSc: 0-No pain                 Mekenzie Modeste S

## 2021-06-13 ENCOUNTER — Encounter (HOSPITAL_COMMUNITY): Payer: Self-pay | Admitting: Oral Surgery

## 2021-06-19 DIAGNOSIS — G4733 Obstructive sleep apnea (adult) (pediatric): Secondary | ICD-10-CM | POA: Diagnosis not present

## 2021-06-27 ENCOUNTER — Ambulatory Visit: Payer: BC Managed Care – PPO | Admitting: Family

## 2021-06-27 DIAGNOSIS — G4733 Obstructive sleep apnea (adult) (pediatric): Secondary | ICD-10-CM | POA: Diagnosis not present

## 2021-06-28 ENCOUNTER — Ambulatory Visit (INDEPENDENT_AMBULATORY_CARE_PROVIDER_SITE_OTHER): Payer: BC Managed Care – PPO | Admitting: Gastroenterology

## 2021-06-28 ENCOUNTER — Encounter: Payer: Self-pay | Admitting: Gastroenterology

## 2021-06-28 VITALS — BP 128/80 | HR 60 | Ht 72.0 in | Wt 193.4 lb

## 2021-06-28 DIAGNOSIS — K600 Acute anal fissure: Secondary | ICD-10-CM

## 2021-06-28 MED ORDER — AMBULATORY NON FORMULARY MEDICATION: 1 refills | Status: DC

## 2021-06-28 NOTE — Patient Instructions (Signed)
We have sent a prescription for nitroglycerin 0.125% gel to [facility]. You should apply a pea size amount to your rectum three times daily x 6-8 weeks.  [facility]'s information is below: Address: [address], [place], [place] [address]  [phone]  *Please DO NOT go directly from our office to pick up this medication! Give the pharmacy 1 day to process the prescription as this is compounded and takes time to make.  Please purchase the following medications over the counter and take as directed: Metamucil daily  If you are age 36 or older, your body mass index should be between 23-30. Your Body mass index is 26.23 kg/m. If this is out of the aforementioned range listed, please consider follow up with your Primary Care Provider.  If you are age 36 or younger, your body mass index should be between 19-25. Your Body mass index is 26.23 kg/m. If this is out of the aformentioned range listed, please consider follow up with your Primary Care Provider.   ________________________________________________________  The  GI providers would like to encourage you to use [facility] to communicate with providers for non-urgent requests or questions.  Due to long hold times on the telephone, sending your provider a message by [facility] may be a faster and more efficient way to get a response.  Please allow 48 business hours for a response.  Please remember that this is for non-urgent requests.  _______________________________________________________  Due to recent changes in healthcare laws, you may see the results of your imaging and laboratory studies on MyChart before your provider has had a chance to review them.  We understand that in some cases there may be results that are confusing or concerning to you. Not all laboratory results come back in the same time frame and the provider may be waiting for multiple results in order to interpret others.  Please give [place] 48 hours  in order for your provider to thoroughly review all the results before contacting the office for clarification of your results.

## 2021-06-28 NOTE — Progress Notes (Signed)
HPI : John Faulkner is a very pleasant 36 year old male with a history of anxiety and depression who is referred to [place] by Debbrah Alar, NP for further evaluation of pain with defecation.  Patient started having pain with passage of stool and late October of this year.  This pain was persistent over 2 months, but seems to have resolved as of about 2 weeks ago.  Patient had a jaw surgery a little over 2 weeks ago and has been on liquid diet which has resulted in a change in his stool caliber and consistency, which he associates with the improvement in his pain with defecation.  He denies seeing any blood in the stool on the toilet paper.  He denies feeling any protrusions, lumps or bumps the perianal area.  No drainage or seepage of fluid from the anal area.  Patient describes the pain associated with bowel movements as severe and similar to passing broken glass per rectum.  Pain with persist sometimes for hours after bowel movement.  Occasionally, he would get pain in the absence of a bowel movement. He does struggle with chronic constipation manifested by small hard stools and infrequent bowel movements.   Past Medical History:  Diagnosis Date   Acute calculous cholecystitis 08/13/2011   Anxiety    Depression    Elevated blood pressure    Hypertension    Nephrotic syndrome 1989   Sleep apnea      Past Surgical History:  Procedure Laterality Date   CARPAL TUNNEL RELEASE Left    CHOLECYSTECTOMY  08/13/2011   Procedure: LAPAROSCOPIC CHOLECYSTECTOMY WITH INTRAOPERATIVE CHOLANGIOGRAM;  Surgeon: Judieth Keens, DO;  Location: [place];  Service: General;  Laterality: N/A;      LAPAROSCOPIC GASTRIC SLEEVE RESECTION N/A 10/10/2020   Procedure: LAPAROSCOPIC GASTRIC SLEEVE RESECTION;  Surgeon: Clovis Riley, MD;  Location: [facility];  Service: General;  Laterality: N/A;   MANDIBLE OSTEOTOMY N/A 06/10/2021   Procedure: MANDIBULAR OSTEOTOMY;  Surgeon: Gardner Candle, DMD;  Location: [place];  Service: Oral Surgery;  Laterality: N/A;   MAXILLA OSTEOTOMY N/A 06/10/2021   Procedure: LEFORT 1 MAXILLARY OSTEOTOMY;  Surgeon: Gardner Candle, DMD;  Location: [place];  Service: Oral Surgery;  Laterality: N/A;   TONSILLECTOMY  06/26/1999   UPPER GI ENDOSCOPY N/A 10/10/2020   Procedure: UPPER GI ENDOSCOPY;  Surgeon: Clovis Riley, MD;  Location: [facility];  Service: General;  Laterality: N/A;   Family History  Problem Relation Age of Onset   Pneumonia Mother        covid 3 pneumonia   COPD Father    Diabetes Father    Heart disease Father 47   Stroke Father    Colon polyps Father        in his late 21s   Diabetes Maternal Grandfather    Diabetes Paternal Grandfather    Colon cancer Neg Hx    Stomach cancer Neg Hx    Esophageal cancer Neg Hx    Pancreatic cancer Neg Hx    Social History   Tobacco Use   Smoking status: Never   Smokeless tobacco: Never  Vaping Use   Vaping Use: Never used  Substance Use Topics   Alcohol use: No   Drug use: No   Current Outpatient Medications  Medication Sig Dispense Refill   amphetamine-dextroamphetamine (ADDERALL XR) 10 MG 24 hr capsule Take 1 capsule (10 mg total) by mouth daily. 30 capsule 0   amphetamine-dextroamphetamine (ADDERALL) 5 MG tablet Take  1 tab by mouth once daily in the afternoon 30 tablet 0   anastrozole (ARIMIDEX) 1 MG tablet Take 1 mg by mouth every Monday.     chlorhexidine (PERIDEX) 0.12 % solution 15 mLs by Mouth Rinse route 2 (two) times daily. 120 mL 0   DHEA 50 MG CAPS Take 50 mg by mouth 3 (three) times a week.     escitalopram (LEXAPRO) 10 MG tablet TAKE 1 TABLET(10 MG) BY MOUTH DAILY 90 tablet 1   Multiple Vitamins-Minerals (MULTIVITAMIN WITH MINERALS) tablet Take 1 tablet by mouth daily.     OVER THE COUNTER MEDICATION Take 1 capsule by mouth daily. DIM     docusate (COLACE) 50 MG/5ML liquid Take 10 mLs (100 mg total) by mouth 2 (two) times daily as needed for mild constipation. 100 mL 0   No current  facility-administered medications for this visit.   No Known Allergies   Review of Systems: All systems reviewed and negative except where noted in HPI.    DG Orthopantogram  Result Date: 06/11/2021 CLINICAL DATA:  Status post mandibular osteotomy. EXAM: ORTHOPANTOGRAM/PANORAMIC COMPARISON:  Maxillofacial CT, 05/05/2021. FINDINGS: Mandibular have been performed at the level of the mandibular molars, 31 and 18. Fixation plates and small screws span the osteotomy defects, which are well aligned. Additional fixation plates and small screws are noted along the maxilla bilaterally IMPRESSION: Status post mandibular osteotomies and maxilla and mandibular ORIF. Mandibular osteotomies are well aligned. Electronically Signed   By: Lajean Manes M.D.   On: 06/11/2021 10:03    Physical Exam: BP 128/80    Pulse 60    Ht 6' (1.829 m)    Wt 193 lb 6.4 oz (87.7 kg)    BMI 26.23 kg/m  Constitutional: Pleasant,well-developed, Caucasian male in no acute distress. HEENT: Normocephalic and atraumatic. Conjunctivae are normal. No scleral icterus. Neck supple.  Cardiovascular: Normal rate, regular rhythm.  Pulmonary/chest: Effort normal and breath sounds normal. No wheezing, rales or rhonchi. Abdominal: Soft, nondistended, nontender. Bowel sounds active throughout. There are no masses palpable. No hepatomegaly. Extremities: no edema Lymphadenopathy: No cervical adenopathy noted. Rectal: Chaperone deferred by patient;  No skin tag, external hemorrhoid present, a small posterior midline anal fissure is visible.  Digital exam deferred Skin: Skin is warm and dry. No rashes noted. Psychiatric: Normal mood and affect. Behavior is normal.  CBC    Component Value Date/Time   WBC 16.7 (H) 06/11/2021 0033   RBC 4.20 (L) 06/11/2021 0033   HGB 13.2 06/11/2021 0033   HCT 39.0 06/11/2021 0033   PLT 151 06/11/2021 0033   MCV 92.9 06/11/2021 0033   MCH 31.4 06/11/2021 0033   MCHC 33.8 06/11/2021 0033   RDW 12.1  06/11/2021 0033   LYMPHSABS 1.7 06/07/2021 0900   MONOABS 0.5 06/07/2021 0900   EOSABS 0.1 06/07/2021 0900   BASOSABS 0.0 06/07/2021 0900    CMP     Component Value Date/Time   NA 135 06/11/2021 0033   K 4.3 06/11/2021 0033   CL 105 06/11/2021 0033   CO2 24 06/11/2021 0033   GLUCOSE 161 (H) 06/11/2021 0033   BUN 8 06/11/2021 0033   CREATININE 0.78 06/11/2021 0033   CREATININE 0.73 04/29/2020 0927   CALCIUM 8.7 (L) 06/11/2021 0033   PROT 6.5 05/29/2021 0849   ALBUMIN 4.3 05/29/2021 0849   AST 18 05/29/2021 0849   ALT 18 05/29/2021 0849   ALKPHOS 58 05/29/2021 0849   BILITOT 0.5 05/29/2021 0849   GFRNONAA >60 06/11/2021 0033  GFRAA >90 08/14/2011 0552     ASSESSMENT AND PLAN: 36 year old male with 2+ months of painful defecation without hematochezia, recently improved in the last 2 weeks with change in diet and stool consistency.  Physical exam notable for small posterior midline anal fissure.  Although atypical the patient did not have any bleeding with the fissure, this is the most likely etiology for his pain.  No need for endoscopic evaluation at this time.  We discussed the pathophysiology and management of anal fissures.  Namely, we discussed the importance of avoiding hard stools and straining and optimizing the stool consistency.  I recommended he start taking intra-anal nitroglycerin twice a day for the next 6 weeks, even though his pain is currently resolved.  I recommend he start taking Metamucil on a daily basis.  Consider nightly sitz baths.  If symptoms not responding despite medical therapy and improvement in stool consistency, then consider Botox injection.  Anal fissure - Intra-anal nitroglycerin 0.125% BID x 6 weeks - Daily Metamucil  Amilyah Nack E. Candis Schatz, MD [facility]  CC:  Debbrah Alar, NP

## 2021-06-30 ENCOUNTER — Telehealth: Payer: Self-pay | Admitting: Family

## 2021-06-30 ENCOUNTER — Ambulatory Visit (INDEPENDENT_AMBULATORY_CARE_PROVIDER_SITE_OTHER): Payer: BC Managed Care – PPO | Admitting: Family

## 2021-06-30 VITALS — BP 134/79 | HR 63 | Temp 97.9°F | Resp 16 | Wt 192.0 lb

## 2021-06-30 DIAGNOSIS — Z9884 Bariatric surgery status: Secondary | ICD-10-CM

## 2021-06-30 DIAGNOSIS — F909 Attention-deficit hyperactivity disorder, unspecified type: Secondary | ICD-10-CM

## 2021-06-30 DIAGNOSIS — I10 Essential (primary) hypertension: Secondary | ICD-10-CM | POA: Diagnosis not present

## 2021-06-30 LAB — COMPREHENSIVE METABOLIC PANEL
ALT: 17 U/L (ref 0–53)
AST: 14 U/L (ref 0–37)
Albumin: 4.3 g/dL (ref 3.5–5.2)
Alkaline Phosphatase: 76 U/L (ref 39–117)
BUN: 11 mg/dL (ref 6–23)
CO2: 32 mEq/L (ref 19–32)
Calcium: 9.5 mg/dL (ref 8.4–10.5)
Chloride: 104 mEq/L (ref 96–112)
Creatinine, Ser: 0.87 mg/dL (ref 0.40–1.50)
GFR: 111.82 mL/min (ref >60.00)
Glucose, Bld: 70 mg/dL (ref 70–99)
Potassium: 5 mEq/L (ref 3.5–5.1)
Sodium: 141 mEq/L (ref 135–145)
Total Bilirubin: 0.6 mg/dL (ref 0.2–1.2)
Total Protein: 6.7 g/dL (ref 6.0–8.3)

## 2021-06-30 LAB — CBC WITH DIFFERENTIAL/PLATELET
Basophils Absolute: 0.1 10*3/uL (ref 0.0–0.1)
Basophils Relative: 1.4 % (ref 0.0–3.0)
Eosinophils Absolute: 0.2 10*3/uL (ref 0.0–0.7)
Eosinophils Relative: 4 % (ref 0.0–5.0)
HCT: 40.9 % (ref 39.0–52.0)
Hemoglobin: 13.5 g/dL (ref 13.0–17.0)
Lymphocytes Relative: 32.9 % (ref 12.0–46.0)
Lymphs Abs: 1.6 10*3/uL (ref 0.7–4.0)
MCHC: 33.1 g/dL (ref 30.0–36.0)
MCV: 93.2 fl (ref 78.0–100.0)
Monocytes Absolute: 0.5 10*3/uL (ref 0.1–1.0)
Monocytes Relative: 10.3 % (ref 3.0–12.0)
Neutro Abs: 2.4 10*3/uL (ref 1.4–7.7)
Neutrophils Relative %: 51.4 % (ref 43.0–77.0)
Platelets: 177 10*3/uL (ref 150.0–400.0)
RBC: 4.39 Mil/uL (ref 4.22–5.81)
RDW: 13 % (ref 11.5–15.5)
WBC: 4.8 10*3/uL (ref 4.0–10.5)

## 2021-06-30 LAB — VITAMIN B12: Vitamin B-12: 423 pg/mL (ref 211–911)

## 2021-06-30 MED ORDER — AMPHETAMINE-DEXTROAMPHETAMINE 5 MG PO TABS: ORAL_TABLET | ORAL | 0 refills | Status: DC

## 2021-06-30 MED ORDER — CYANOCOBALAMIN 500 MCG PO TABS: 500.0000 ug | ORAL_TABLET | Freq: Every day | ORAL | Status: DC

## 2021-06-30 MED ORDER — AMPHETAMINE-DEXTROAMPHET ER 10 MG PO CP24: 10.0000 mg | ORAL_CAPSULE | Freq: Every day | ORAL | 0 refills | Status: DC

## 2021-06-30 NOTE — Telephone Encounter (Signed)
Please call [facility] in [place] and cancel both rx's for Adderall sent last month. They are out of stock.

## 2021-06-30 NOTE — Telephone Encounter (Signed)
Called pharmacy and left voice mail to cancell rx

## 2021-06-30 NOTE — Telephone Encounter (Signed)
See mychart.  

## 2021-06-30 NOTE — Patient Instructions (Addendum)
Please continue high protein diet.  Start new dosing of Adderall.

## 2021-06-30 NOTE — Assessment & Plan Note (Signed)
New Rx's sent for adderall to new pharmacy.

## 2021-06-30 NOTE — Progress Notes (Signed)
Subjective:     Patient ID: John Faulkner, male    DOB: 1986-02-02, 36 y.o.   MRN: [id number]  Chief Complaint  Patient presents with   ADHD    Patient reports he did not start new dose yet   Dizziness    Patient complains of dizziness when standing up    Dizziness  Patient is a 36 yr old male who presents today for for follow up. Since his last visit he underwent mandibular/maxillary osteotomies. His post op course has been fairly unremarkable. He was having trouble getting more than 500 cal a day the first week but now he is up to about 2000 calories a day- mostly protein shakes and mashed potatoes.  He does report that he has had some dizziness with standing the last few weeks.  He did not change his adderall dosing as we discussed last visit because his pharmacy was out of stock.  Health Maintenance Due  Topic Date Due   Hepatitis C Screening  Never done   COVID-19 Vaccine (4 - Booster for Pfizer series) 07/25/2020    Past Medical History:  Diagnosis Date   Acute calculous cholecystitis 08/13/2011   Anxiety    Depression    Elevated blood pressure    Hypertension    Nephrotic syndrome 1989   Sleep apnea     Past Surgical History:  Procedure Laterality Date   CARPAL TUNNEL RELEASE Left    CHOLECYSTECTOMY  08/13/2011   Procedure: LAPAROSCOPIC CHOLECYSTECTOMY WITH INTRAOPERATIVE CHOLANGIOGRAM;  Surgeon: Judieth Keens, DO;  Location: [place];  Service: General;  Laterality: N/A;      LAPAROSCOPIC GASTRIC SLEEVE RESECTION N/A 10/10/2020   Procedure: LAPAROSCOPIC GASTRIC SLEEVE RESECTION;  Surgeon: Clovis Riley, MD;  Location: [facility];  Service: General;  Laterality: N/A;   MANDIBLE OSTEOTOMY N/A 06/10/2021   Procedure: MANDIBULAR OSTEOTOMY;  Surgeon: Gardner Candle, DMD;  Location: [place];  Service: Oral Surgery;  Laterality: N/A;   MAXILLA OSTEOTOMY N/A 06/10/2021   Procedure: LEFORT 1 MAXILLARY OSTEOTOMY;  Surgeon: Gardner Candle, DMD;  Location: [place];  Service: Oral Surgery;  Laterality: N/A;   TONSILLECTOMY  06/26/1999   UPPER GI ENDOSCOPY N/A 10/10/2020   Procedure: UPPER GI ENDOSCOPY;  Surgeon: Clovis Riley, MD;  Location: [facility];  Service: General;  Laterality: N/A;    Family History  Problem Relation Age of Onset   Pneumonia Mother        covid 29 pneumonia   COPD Father    Diabetes Father    Heart disease Father 55   Stroke Father    Colon polyps Father        in his late 44s   Diabetes Maternal Grandfather    Diabetes Paternal Grandfather    Colon cancer Neg Hx    Stomach cancer Neg Hx    Esophageal cancer Neg Hx    Pancreatic cancer Neg Hx     Social History   Socioeconomic History   Marital status: Married    Spouse name: Not on file   Number of children: Not on file   Years of education: Not on file   Highest education level: Not on file  Occupational History   Not on file  Tobacco Use   Smoking status: Never   Smokeless tobacco: Never  Vaping Use   Vaping Use: Never used  Substance and Sexual Activity   Alcohol use: No   Drug use: No   Sexual activity:  Not on file  Other Topics Concern   Not on file  Social History Narrative   Work or School:       Home Situation: lives with wife, 2 children   Daughter 2014   Son 2018      Has a cat a Nurse, mental health      Spiritual Beliefs: none      Lifestyle: no regular exercise; poor      Works for a Tax inspector      Social Determinants of Corporate investment banker Strain: Not on Ship broker Insecurity: Not on file  Transportation Needs: Not on file  Physical Activity: Not on file  Stress: Not on file  Social Connections: Not on file  Intimate Partner Violence: Not on file    Outpatient Medications Prior to Visit  Medication Sig Dispense Refill   AMBULATORY NON FORMULARY MEDICATION nitroglycerin 0.125% gel- Apply a pea size amount to the rectum three times daily x 6-8 weeks. 30 g 1   anastrozole (ARIMIDEX) 1 MG tablet Take 1  mg by mouth every Monday.     chlorhexidine (PERIDEX) 0.12 % solution 15 mLs by Mouth Rinse route 2 (two) times daily. 120 mL 0   DHEA 50 MG CAPS Take 50 mg by mouth 3 (three) times a week.     escitalopram (LEXAPRO) 10 MG tablet TAKE 1 TABLET(10 MG) BY MOUTH DAILY 90 tablet 1   Multiple Vitamins-Minerals (MULTIVITAMIN WITH MINERALS) tablet Take 1 tablet by mouth daily.     OVER THE COUNTER MEDICATION Take 1 capsule by mouth daily. DIM     amphetamine-dextroamphetamine (ADDERALL) 5 MG tablet Take 1 tab by mouth once daily in the afternoon 30 tablet 0   amphetamine-dextroamphetamine (ADDERALL XR) 10 MG 24 hr capsule Take 1 capsule (10 mg total) by mouth daily. (Patient not taking: Reported on 06/30/2021) 30 capsule 0   No facility-administered medications prior to visit.    No Known Allergies  Review of Systems  Neurological:  Positive for dizziness.     See HPI  Objective:    Physical Exam Constitutional:      General: He is not in acute distress.    Appearance: He is well-developed.  HENT:     Head: Normocephalic and atraumatic.  Cardiovascular:     Rate and Rhythm: Normal rate and regular rhythm.     Heart sounds: No murmur heard. Pulmonary:     Effort: Pulmonary effort is normal. No respiratory distress.     Breath sounds: Normal breath sounds. No wheezing or rales.  Skin:    General: Skin is warm and dry.  Neurological:     Mental Status: He is alert and oriented to person, place, and time.  Psychiatric:        Behavior: Behavior normal.        Thought Content: Thought content normal.    BP 134/79 (BP Location: Right Arm, Patient Position: Sitting, Cuff Size: Small)    Pulse 63    Temp 97.9 F (36.6 C) (Oral)    Resp 16    Wt 192 lb (87.1 kg)    SpO2 100%    BMI 26.04 kg/m  Wt Readings from Last 3 Encounters:  06/30/21 192 lb (87.1 kg)  06/28/21 193 lb 6.4 oz (87.7 kg)  06/10/21 205 lb (93 kg)       Assessment & Plan:   Problem List Items Addressed This Visit        Unprioritized  Hypertension    Not on antihypertensives.  BP Readings from Last 3 Encounters:  06/30/21 134/79  06/28/21 128/80  06/11/21 (!) 111/58  We checked orthostatics today due to his c/o dizziness with standing. Only minor change. Continue hydration, high protein diet, stand up slowly. Labs as ordered.        ADHD (attention deficit hyperactivity disorder)    New Rx's sent for adderall to new pharmacy.        Other Visit Diagnoses     Bariatric surgery status    -  Primary   Relevant Orders   CBC with Differential/Platelet   B12   Comp Met (CMET)       I am having Lyncoln P. Earlene Plater "Don" maintain his escitalopram, anastrozole, OVER THE COUNTER MEDICATION, DHEA, multivitamin with minerals, chlorhexidine, AMBULATORY NON FORMULARY MEDICATION, amphetamine-dextroamphetamine, and amphetamine-dextroamphetamine.  Meds ordered this encounter  Medications   amphetamine-dextroamphetamine (ADDERALL XR) 10 MG 24 hr capsule    Sig: Take 1 capsule (10 mg total) by mouth daily.    Dispense:  30 capsule    Refill:  0    Order Specific Question:   Supervising Provider    Answer:   Penni Homans A [[id number]]   amphetamine-dextroamphetamine (ADDERALL) 5 MG tablet    Sig: Take 1 tab by mouth once daily in the afternoon    Dispense:  30 tablet    Refill:  0    Order Specific Question:   Supervising Provider    Answer:   Penni Homans A [[id number]]

## 2021-06-30 NOTE — Assessment & Plan Note (Signed)
Not on antihypertensives.  BP Readings from Last 3 Encounters:  06/30/21 134/79  06/28/21 128/80  06/11/21 (!) 111/58   We checked orthostatics today due to his c/o dizziness with standing. Only minor change. Continue hydration, high protein diet, stand up slowly. Labs as ordered.

## 2021-07-10 ENCOUNTER — Telehealth: Payer: Self-pay | Admitting: *Deleted

## 2021-07-10 NOTE — Telephone Encounter (Signed)
Transition Care Management Unsuccessful Follow-up Telephone Call  Date of discharge and from where: [facility] 06/11/2021  Attempts:  1st Attempt  Reason for unsuccessful TCM follow-up call:  No answer/busy  Irving Shows [facility], BSN RN Case Manager [phone]

## 2021-07-12 ENCOUNTER — Telehealth: Payer: Self-pay

## 2021-07-12 DIAGNOSIS — R7989 Other specified abnormal findings of blood chemistry: Secondary | ICD-10-CM | POA: Diagnosis not present

## 2021-07-12 DIAGNOSIS — Z125 Encounter for screening for malignant neoplasm of prostate: Secondary | ICD-10-CM | POA: Diagnosis not present

## 2021-07-12 DIAGNOSIS — R5383 Other fatigue: Secondary | ICD-10-CM | POA: Diagnosis not present

## 2021-07-12 DIAGNOSIS — E291 Testicular hypofunction: Secondary | ICD-10-CM | POA: Diagnosis not present

## 2021-07-12 NOTE — Telephone Encounter (Signed)
Transition Care Management Unsuccessful Follow-up Telephone Call  Date of discharge and from where:  06/11/2021   [facility]  Attempts:  2nd Attempt  Reason for unsuccessful TCM follow-up call:  No answer/busy  Tomasa Rand, RN, BSN, CEN [place] Coordinator [phone]

## 2021-07-14 DIAGNOSIS — R6882 Decreased libido: Secondary | ICD-10-CM | POA: Diagnosis not present

## 2021-07-14 DIAGNOSIS — E291 Testicular hypofunction: Secondary | ICD-10-CM | POA: Diagnosis not present

## 2021-07-14 DIAGNOSIS — M6281 Muscle weakness (generalized): Secondary | ICD-10-CM | POA: Diagnosis not present

## 2021-07-20 DIAGNOSIS — G4733 Obstructive sleep apnea (adult) (pediatric): Secondary | ICD-10-CM | POA: Diagnosis not present

## 2021-07-27 ENCOUNTER — Other Ambulatory Visit: Payer: Self-pay | Admitting: Family

## 2021-07-31 ENCOUNTER — Ambulatory Visit (INDEPENDENT_AMBULATORY_CARE_PROVIDER_SITE_OTHER): Payer: BC Managed Care – PPO | Admitting: Family

## 2021-07-31 DIAGNOSIS — F909 Attention-deficit hyperactivity disorder, unspecified type: Secondary | ICD-10-CM | POA: Diagnosis not present

## 2021-07-31 MED ORDER — AMPHETAMINE-DEXTROAMPHETAMINE 5 MG PO TABS: ORAL_TABLET | ORAL | 0 refills | Status: DC

## 2021-07-31 MED ORDER — AMPHETAMINE-DEXTROAMPHET ER 10 MG PO CP24: 10.0000 mg | ORAL_CAPSULE | Freq: Every day | ORAL | 0 refills | Status: DC

## 2021-07-31 NOTE — Assessment & Plan Note (Signed)
Improved focus on new regimen. Will continue same.

## 2021-07-31 NOTE — Progress Notes (Signed)
Subjective:     Patient ID: John Faulkner, male    DOB: [birth year], 36 y.o.   MRN: [id number]  Chief Complaint  Patient presents with   ADHD    Follow up    HPI Patient is in today for follow up.   ADHD- due to uncontrolled symptoms, the following recommendations were made:  Change adderall from 5mg  bid to 10mg  xr in the am and 5mg  in the PM.  He states that he has been using the above regimen.  Focus is improved in the AM. Definitely feels a boost of focus in the PM when he takes the 5mg  dose.   He continues to progress well from his osteotomy and is following with his oral surgeon.   Health Maintenance Due  Topic Date Due   Hepatitis C Screening  Never done   COVID-19 Vaccine (4 - Booster for Pfizer series) 07/25/2020   BP Readings from Last 3 Encounters:  07/31/21 137/74  06/30/21 134/79  06/28/21 128/80   Wt Readings from Last 3 Encounters:  07/31/21 192 lb (87.1 kg)  06/30/21 192 lb (87.1 kg)  06/28/21 193 lb 6.4 oz (87.7 kg)     Past Medical History:  Diagnosis Date   Acute calculous cholecystitis 08/13/2011   Anxiety    Depression    Elevated blood pressure    Hypertension    Nephrotic syndrome 1989   Sleep apnea     Past Surgical History:  Procedure Laterality Date   CARPAL TUNNEL RELEASE Left    CHOLECYSTECTOMY  08/13/2011   Procedure: LAPAROSCOPIC CHOLECYSTECTOMY WITH INTRAOPERATIVE CHOLANGIOGRAM;  Surgeon: 08/28/21, DO;  Location: [facility];  Service: General;  Laterality: N/A;      LAPAROSCOPIC GASTRIC SLEEVE RESECTION N/A 10/10/2020   Procedure: LAPAROSCOPIC GASTRIC SLEEVE RESECTION;  Surgeon: 08/15/2011, MD;  Location: [facility];  Service: General;  Laterality: N/A;   MANDIBLE OSTEOTOMY N/A 06/10/2021   Procedure: MANDIBULAR OSTEOTOMY;  Surgeon: [facility], DMD;  Location: [facility];  Service: Oral Surgery;  Laterality: N/A;   MAXILLA OSTEOTOMY N/A 06/10/2021   Procedure: LEFORT 1 MAXILLARY OSTEOTOMY;  Surgeon: Berna Bue, DMD;  Location: [facility];  Service: Oral Surgery;  Laterality: N/A;   TONSILLECTOMY  06/26/1999   UPPER GI ENDOSCOPY N/A 10/10/2020   Procedure: UPPER GI ENDOSCOPY;  Surgeon: 06/12/2021, MD;  Location: [facility];  Service: General;  Laterality: N/A;    Family History  Problem Relation Age of Onset   Pneumonia Mother        covid 52 pneumonia   COPD Father    Diabetes Father    Heart disease Father 20   Stroke Father    Colon polyps Father        in his late 30s   Diabetes Maternal Grandfather    Diabetes Paternal Grandfather    Colon cancer Neg Hx    Stomach cancer Neg Hx    Esophageal cancer Neg Hx    Pancreatic cancer Neg Hx     Social History   Socioeconomic History   Marital status: Married    Spouse name: Not on file   Number of children: Not on file   Years of education: Not on file   Highest education level: Not on file  Occupational History   Not on file  Tobacco Use   Smoking status: Never   Smokeless tobacco: Never  Vaping Use   Vaping Use: Never used  Substance and Sexual  Activity   Alcohol use: No   Drug use: No   Sexual activity: Not on file  Other Topics Concern   Not on file  Social History Narrative   Work or School:       Home Situation: lives with wife, 2 children   Daughter 2014   Son 2018      Has a cat a Nurse, mental health      Spiritual Beliefs: none      Lifestyle: no regular exercise; poor      Works for a Tax inspector      Social Determinants of Corporate investment banker Strain: Not on file  Food Insecurity: Not on file  Transportation Needs: Not on file  Physical Activity: Not on file  Stress: Not on file  Social Connections: Not on file  Intimate Partner Violence: Not on file    Outpatient Medications Prior to Visit  Medication Sig Dispense Refill   AMBULATORY NON FORMULARY MEDICATION nitroglycerin 0.125% gel- Apply a pea size amount to the rectum three times daily x 6-8 weeks. 30 g 1   anastrozole  (ARIMIDEX) 1 MG tablet Take 1 mg by mouth every Monday.     DHEA 50 MG CAPS Take 50 mg by mouth 3 (three) times a week.     escitalopram (LEXAPRO) 10 MG tablet TAKE 1 TABLET DAILY 90 tablet 3   Multiple Vitamins-Minerals (MULTIVITAMIN WITH MINERALS) tablet Take 1 tablet by mouth daily.     OVER THE COUNTER MEDICATION Take 1 capsule by mouth daily. DIM     vitamin B-12 (CYANOCOBALAMIN) 500 MCG tablet Take 1 tablet (500 mcg total) by mouth daily.     amphetamine-dextroamphetamine (ADDERALL XR) 10 MG 24 hr capsule Take 1 capsule (10 mg total) by mouth daily. 30 capsule 0   amphetamine-dextroamphetamine (ADDERALL) 5 MG tablet Take 1 tab by mouth once daily in the afternoon 30 tablet 0   chlorhexidine (PERIDEX) 0.12 % solution 15 mLs by Mouth Rinse route 2 (two) times daily. 120 mL 0   No facility-administered medications prior to visit.    No Known Allergies  ROS    See HPI Objective:    Physical Exam Constitutional:      Appearance: Normal appearance.  HENT:     Head: Normocephalic and atraumatic.  Neurological:     Mental Status: He is alert and oriented to person, place, and time.  Psychiatric:        Mood and Affect: Mood normal.        Behavior: Behavior normal.        Thought Content: Thought content normal.        Judgment: Judgment normal.    BP 137/74 (BP Location: Right Arm, Patient Position: Sitting, Cuff Size: Small)    Pulse (!) 56    Temp 98.2 F (36.8 C) (Oral)    Resp 16    Wt 192 lb (87.1 kg)    SpO2 100%    BMI 26.04 kg/m  Wt Readings from Last 3 Encounters:  07/31/21 192 lb (87.1 kg)  06/30/21 192 lb (87.1 kg)  06/28/21 193 lb 6.4 oz (87.7 kg)       Assessment & Plan:   Problem List Items Addressed This Visit       Unprioritized   ADHD (attention deficit hyperactivity disorder)    Improved focus on new regimen. Will continue same.        I have discontinued Roylene Reason. Gaubert "Don"'s chlorhexidine. I am  also having him maintain his anastrozole,  OVER THE COUNTER MEDICATION, DHEA, multivitamin with minerals, AMBULATORY NON FORMULARY MEDICATION, vitamin B-12, escitalopram, amphetamine-dextroamphetamine, and amphetamine-dextroamphetamine.  Meds ordered this encounter  Medications   amphetamine-dextroamphetamine (ADDERALL XR) 10 MG 24 hr capsule    Sig: Take 1 capsule (10 mg total) by mouth daily.    Dispense:  30 capsule    Refill:  0    Order Specific Question:   Supervising Provider    Answer:   Danise Edge A [[id number]]   amphetamine-dextroamphetamine (ADDERALL) 5 MG tablet    Sig: Take 1 tab by mouth once daily in the afternoon    Dispense:  30 tablet    Refill:  0    Order Specific Question:   Supervising Provider    Answer:   Danise Edge A [[id number]]

## 2021-07-31 NOTE — Patient Instructions (Signed)
Please complete lab work prior to leaving.   

## 2021-08-03 ENCOUNTER — Other Ambulatory Visit (HOSPITAL_BASED_OUTPATIENT_CLINIC_OR_DEPARTMENT_OTHER): Payer: Self-pay

## 2021-08-03 ENCOUNTER — Encounter: Payer: Self-pay | Admitting: Family

## 2021-08-03 MED ORDER — AMPHETAMINE-DEXTROAMPHET ER 10 MG PO CP24
10.0000 mg | ORAL_CAPSULE | Freq: Every day | ORAL | 0 refills | Status: DC
  Filled 2021-08-03: qty 30, 30d supply, fill #0

## 2021-08-03 NOTE — Telephone Encounter (Signed)
Rx sent downstairs. Please cancel Adderall xr at [facility].

## 2021-08-03 NOTE — Telephone Encounter (Signed)
Can you send the Adderall XR 10mg  downstairs please? I have verified that they have it in stock.

## 2021-08-30 ENCOUNTER — Other Ambulatory Visit: Payer: Self-pay | Admitting: Family

## 2021-08-30 MED ORDER — AMPHETAMINE-DEXTROAMPHETAMINE 5 MG PO TABS: ORAL_TABLET | ORAL | 0 refills | Status: DC

## 2021-08-30 NOTE — Telephone Encounter (Signed)
Requesting: Adderall 5mg   ?Contract: 02/20/21 ?UDS: 02/20/21 ?Last Visit: 07/31/21 ?Next Visit: 01/29/22 ?Last Refill: 07/31/21 ? ?Please Advise ? ?

## 2021-09-04 ENCOUNTER — Other Ambulatory Visit: Payer: Self-pay | Admitting: Family

## 2021-09-04 ENCOUNTER — Other Ambulatory Visit (HOSPITAL_BASED_OUTPATIENT_CLINIC_OR_DEPARTMENT_OTHER): Payer: Self-pay

## 2021-09-04 MED ORDER — AMPHETAMINE-DEXTROAMPHET ER 10 MG PO CP24
10.0000 mg | ORAL_CAPSULE | Freq: Every day | ORAL | 0 refills | Status: DC
  Filled 2021-09-04: qty 30, 30d supply, fill #0

## 2021-09-04 NOTE — Telephone Encounter (Signed)
Requesting: Adderall XR 10mg   ?Contract: 02/20/2021 ?UDS: 02/20/2021 ?Last Visit: 07/31/2021 ?Next Visit: 01/29/2022 ?Last Refill: 08/03/2021 #30 and 0RF ? ?Please Advise ? ?

## 2021-09-12 DIAGNOSIS — T8544XA Capsular contracture of breast implant, initial encounter: Secondary | ICD-10-CM | POA: Diagnosis not present

## 2021-09-12 DIAGNOSIS — N62 Hypertrophy of breast: Secondary | ICD-10-CM | POA: Diagnosis not present

## 2021-09-30 ENCOUNTER — Encounter: Payer: Self-pay | Admitting: Family

## 2021-09-30 ENCOUNTER — Other Ambulatory Visit: Payer: Self-pay | Admitting: Family

## 2021-10-02 MED ORDER — AMPHETAMINE-DEXTROAMPHETAMINE 5 MG PO TABS: ORAL_TABLET | ORAL | 0 refills | Status: DC

## 2021-10-02 NOTE — Telephone Encounter (Signed)
Requesting: Adderall 5mg  ?Contract: 01/31/21 ?UDS: 01/31/21 ?Last Visit: 07/31/21 ?Next Visit: 01/29/22 ?Last Refill: 08/30/21 ? ?Please Advise ? ?

## 2021-10-11 DIAGNOSIS — R7989 Other specified abnormal findings of blood chemistry: Secondary | ICD-10-CM | POA: Diagnosis not present

## 2021-10-11 DIAGNOSIS — Z125 Encounter for screening for malignant neoplasm of prostate: Secondary | ICD-10-CM | POA: Diagnosis not present

## 2021-10-11 DIAGNOSIS — E291 Testicular hypofunction: Secondary | ICD-10-CM | POA: Diagnosis not present

## 2021-10-11 DIAGNOSIS — R5383 Other fatigue: Secondary | ICD-10-CM | POA: Diagnosis not present

## 2021-10-12 ENCOUNTER — Other Ambulatory Visit (HOSPITAL_BASED_OUTPATIENT_CLINIC_OR_DEPARTMENT_OTHER): Payer: Self-pay

## 2021-10-12 ENCOUNTER — Other Ambulatory Visit: Payer: Self-pay | Admitting: Family

## 2021-10-12 MED ORDER — AMPHETAMINE-DEXTROAMPHET ER 10 MG PO CP24
10.0000 mg | ORAL_CAPSULE | Freq: Every day | ORAL | 0 refills | Status: DC
  Filled 2021-10-12: qty 30, 30d supply, fill #0

## 2021-10-12 NOTE — Telephone Encounter (Signed)
Requesting: Adderall XR 10mg  ?Contract: 02/20/21 ?UDS: 02/20/21  ?Last Visit: 07/31/21 ?Next Visit: 01/29/22 ?Last Refill: 09/04/21 ? ?Please Advise ? ?

## 2021-10-13 DIAGNOSIS — E291 Testicular hypofunction: Secondary | ICD-10-CM | POA: Diagnosis not present

## 2021-10-13 DIAGNOSIS — Z6825 Body mass index (BMI) 25.0-25.9, adult: Secondary | ICD-10-CM | POA: Diagnosis not present

## 2021-10-13 DIAGNOSIS — R7989 Other specified abnormal findings of blood chemistry: Secondary | ICD-10-CM | POA: Diagnosis not present

## 2021-10-13 DIAGNOSIS — R6882 Decreased libido: Secondary | ICD-10-CM | POA: Diagnosis not present

## 2021-10-16 ENCOUNTER — Ambulatory Visit: Payer: BC Managed Care – PPO | Admitting: Skilled Nursing Facility1

## 2021-11-10 ENCOUNTER — Other Ambulatory Visit: Payer: Self-pay | Admitting: Family

## 2021-11-10 ENCOUNTER — Other Ambulatory Visit: Payer: Self-pay | Admitting: Family Medicine

## 2021-11-10 MED ORDER — AMPHETAMINE-DEXTROAMPHET ER 10 MG PO CP24: 10.0000 mg | ORAL_CAPSULE | Freq: Every day | ORAL | 0 refills | Status: DC

## 2021-11-10 MED ORDER — AMPHETAMINE-DEXTROAMPHETAMINE 5 MG PO TABS: ORAL_TABLET | ORAL | 0 refills | Status: DC

## 2021-11-10 NOTE — Telephone Encounter (Signed)
Requesting: adderall 5mg  Contract: 02/20/21 UDS: 02/20/21 Last Visit: 07/31/21 Next Visit: 01/29/22 Last Refill: 10/02/21  Please Advise

## 2021-11-10 NOTE — Telephone Encounter (Signed)
Requesting: adderall xr 10mg  Contract: 02/20/21 UDS:  02/20/21 Last Visit: 07/31/21 Next Visit: 01/29/22 Last Refill: 10/12/21  Please Advise

## 2021-11-13 ENCOUNTER — Other Ambulatory Visit (HOSPITAL_BASED_OUTPATIENT_CLINIC_OR_DEPARTMENT_OTHER): Payer: Self-pay

## 2021-11-13 ENCOUNTER — Encounter: Payer: Self-pay | Admitting: Family

## 2021-11-13 MED ORDER — AMPHETAMINE-DEXTROAMPHET ER 10 MG PO CP24
10.0000 mg | ORAL_CAPSULE | Freq: Every day | ORAL | 0 refills | Status: DC
  Filled 2021-11-13 – 2021-11-16 (×2): qty 30, 30d supply, fill #0

## 2021-11-13 NOTE — Telephone Encounter (Signed)
Rx cancelled at [facility]

## 2021-11-13 NOTE — Telephone Encounter (Signed)
Please cancel previous 10mg  dose sent. See mychart.

## 2021-11-14 ENCOUNTER — Encounter: Payer: BC Managed Care – PPO | Attending: Surgery | Admitting: Skilled Nursing Facility1

## 2021-11-14 ENCOUNTER — Encounter: Payer: Self-pay | Admitting: Skilled Nursing Facility1

## 2021-11-14 DIAGNOSIS — E669 Obesity, unspecified: Secondary | ICD-10-CM | POA: Insufficient documentation

## 2021-11-14 NOTE — Progress Notes (Unsigned)
Bariatric Nutrition Follow-Up Visit Medical Nutrition Therapy   NUTRITION ASSESSMENT  Anthropometrics  Surgery date: 10/10/2020 Surgery type: sleeve Start weight at [facility]: 304.3 pounds Weight today: 190.1 pounds  Body Composition Scale 12/07/2020 01/16/2021 04/10/2021 11/14/2021  Weight  lbs 256.1 236.3 211.6 190.1  Total Body Fat  % 28.6 25.6 21.7 16.8     Visceral Fat 17 15 12 10   Fat-Free Mass  % 71.3 74.3 78.2 83.1     Total Body Water  % 52.3 55.3 59.2 64.1     Muscle-Mass  lbs 48.2 44.5 39.8 35.7  BMI 34 31.3 28.7 25.7  Body Fat Displacement ---           Torso  lbs 45.4 37.5 28.5 19.7        Left Leg  lbs 9 7.5 5.7 3.9        Right Leg  lbs 9 7.5 5.7 3.9        Left Arm  lbs 4.5 3.7 2.8 1.9        Right Arm  lbs 4.5 3.7 2.8 1.9    Clinical  Medical hx: HTN Medications: see list  Labs: testosterone 189, Vit D 36 Notable signs/symptoms: N/A Any previous deficiencies? Vitamin D   Lifestyle & Dietary Hx  Pt presents with some clavicle waisting as well as temple waisting as well as protruding scapula: Pt is very aware of this and can see the spots of waisting himself. Pt states his family and friends have made comments asking him if he is sick.  Pt states he is emotionally okay with gaining wt. Pt is aware his extreme fatigue at the end of the day is related to his under consumption and weight for his body type. Pt states his energy level is low, stating when he gets home he is zapped: feeling like an old man.Pt recognizes that he is too exhausted. Pt states he does not exercise (does have a very active job). 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 States he is off Mondays and Fridays. Pt states he has an 8 and 36 yr old. Pt states he takes an extra Vit D supplement 5,000, in addition to a multi vitamin (vitamin D 36). Pt states he does not take calcium supplement. Dietitian advised him to take the calcium and the recommended multivitamin. Pt  states his blood sugars have been low: stating he noticed his blood sugars were 70 at 2 different times before some procedures so the next time he went for labs he made sure to have a 12 ounce real sugar coke to see how that affected his blood sugar and it was still below 70. Pt states he had surgery on his jaw to correct his under bite having lost weight during that time due to his jaws needing to be rubber banded.  Pt states he had some GI issues from a sickness after jaw surgery and lost more weight not having gained the weight he lost with the jaw surgery.  Pt states his current diet of excess simple sugars is not an attempt to gain back weight, stating it is just how he eats. Pt states he wants to get back to 200 lbs (210 might be more appropriate for his frame). Pt states his original goal wt was 220 or 230 pounds.  Pt states he does not drink alcohol.   Test blood sugars at different times of the day, fasting and 2 hours after meals. Pt states he has  had some constipation. Pt states he went to gastroenterologist and states he has a fistula back in January: Pt states currently he is having painful bowel movements daily: Dietitian advised he go back to his gastroenterologist, especially due to the new diet plan including so much more fiber than he is used to. Pt states he has logged all his food and beverages since day one after surgery.    Estimated daily fluid intake: 60-80 oz Estimated daily protein intake: unknown g Supplements: multi and calcium (inconsistent) Current average weekly physical activity: active job at [employer], 2 times a week 30 minutes resistance, bench free weights  24-Hr Dietary Recall First Meal: two french toast stick w/ syrup, stale protein drink and fiber one bar Snack:  14 oz lemonade (full sugar) Second Meal: egg roll, one breaded chicken tender, cinnamon roll, 7.5 oz dr pepper Snack: peach rings 20 Third Meal: cabbage salad (sliced almonds, sunflower  seed, cabbage, raman) Snack: dr pepper, 12 oz, full sugar, 6 oreo cookies. Beverages: diet coke, gatorade zero, gatorade g2, orange juice, water, loaw sugar coconut water  3140 calories (estimated)     Post-Op Goals/ Signs/ Symptoms Using straws: no Drinking while eating: no Chewing/swallowing difficulties: no Changes in vision: no Changes to mood/headaches: no Hair loss/changes to skin/nails: hair thinning  Difficulty focusing/concentrating: no Sweating: no Dizziness/lightheadedness: no Palpitations: no  Carbonated/caffeinated beverages: no N/V/D/C/Gas: no Abdominal pain: no Dumping syndrome: no    NUTRITION DIAGNOSIS  Overweight/obesity ([place]-3.3) related to past poor dietary habits and physical inactivity as evidenced by completed bariatric surgery and following dietary guidelines for continued weight loss and healthy nutrition status.     NUTRITION INTERVENTION Nutrition counseling (C-1) and education (E-2) to facilitate bariatric surgery goals, including: The importance of consuming adequate calories as well as certain nutrients daily due to the body's need for essential vitamins, minerals, and fats The importance of daily physical activity and to reach a goal of at least 150 minutes of moderate to vigorous physical activity weekly (or as directed by their physician) due to benefits such as increased musculature and improved lab values The importance of intuitive eating specifically learning hunger-satiety cues and understanding the importance of learning a new body: The importance of mindful eating to avoid grazing behaviors  The importance of eating with a focus on health not weight  Purpose of hydration: Water makes up over 50% of your total body water, and is part of many organs throughout the body. Water is essential to transport digested nutrients, regulate body temperature, rid the body of waste products, and protects joints and the spinal cord. When not properly  hydrated you will begin to experience headaches, cramps and dizziness. Further dehydration can result in rapid heart rate, shock, oliguria, and may cause seizures.  https://www.merckmanuals.com/home/hormonal-and-metabolic-disordehttps://www.usgs.gov/special-topic/water-science-school/science/water-you-water-and-human-body?qt-science_center_objects=0#qt-science_center_objectsrs/water-balance/about-body-water FriendLock.ithttps://www.cdc.gov/healthywater/drinking/nutrition/index.html InvestmentInstructor.com.cyhttps://medlineplus.gov/ency/article/000982.htm FurEliminator.eshttps://www.heart.org/en/healthy-living/fitness/fitness-basics/staying-hydrated-staying-healthy https://www.health.BasicFM.nohar[employer].edu/staying-healthy/the-importance-of-staying-hydrated Why you need complex carbohydrates: Whole grains and other complex carbohydrates are required to have a healthy diet. Whole grains provide fiber which can help with blood glucose levels and help keep you satiated. Fruits and starchy vegetables provide essential vitamins and minerals required for immune function, eyesight support, brain support, bone density, wound healing and many other functions within the body. According to the current evidenced based 2020-2025 Dietary Guidelines for Americans, complex carbohydrates are part of a healthy eating pattern which is associated with a decreased risk for type 2 diabetes, cancers, and cardiovascular disease.  When should you have a bowel movement?: Everyone has their own bowel regimen, some people have a few bowel movements a day  whereas other may go every 2-3 days. According to the [employer] for Gastrointestinal Disorders, most people have 3 bowel movements a week and anything less can be alerting. When referring to the [facility] Stool Chart; type 1-2 can indicate constipation, 3-4 are ideal, and 5-7 may indicate diarrhea.  https://aboutconstipation.org/stool-form-guide.html,  AdvisorRank.be.pdf  Fiber recommendations: Adults need anywhere from 20-30 grams of fiber daily to ensure adequate intake. Both kinds of fiber, insoluble and soluble, provide health benefits to the body. Insoluble fiber does not dissolve in water and thus will help move digested food through the GI tract, promoting regular bowel movements and preventing constipation. Soluble fiber does dissolve in water and will help lower blood glucose and cholesterol levels. An increased intake of fiber may decrease the risk for cardiovascular disease and will help people with type 2 diabetes control their blood glucose levels.  CropWizard.com.pt, SkincareIndustry.com.pt   Educated patient on calorie dense foods, high in protein and fiber (nuts, dried fruit, granola)  Educated patient on healthy fats, discussing plant based fats vs. Animal fats, as well as a need for essential fatty acids, offering several food options with these nutrients.  Goals -Contact you Gastroenterologist about your bowel pain. -Contact your surgeon for your one year follow-up. -Consume fats including essential fatty acids and protein. -Increase caloric intake by 500 calories to start. -Refer to list of calorically dense inclusive of healthy fats, protein, and complex carbohydrates   Handouts Previously Provided Include  Phase 7  Learning Style & Readiness for Change Teaching method utilized: Visual & Auditory  Demonstrated degree of understanding via: Teach Back  Readiness Level: action Barriers to learning/adherence to lifestyle change: none identified  RD's Notes for Next Visit Assess adherence to pt chosen goals    MONITORING & EVALUATION Dietary intake, weekly physical activity, body weight, Next Steps Patient is to follow-up in 6 weeks

## 2021-11-16 ENCOUNTER — Other Ambulatory Visit (HOSPITAL_BASED_OUTPATIENT_CLINIC_OR_DEPARTMENT_OTHER): Payer: Self-pay

## 2021-11-27 ENCOUNTER — Encounter: Payer: Self-pay | Admitting: Gastroenterology

## 2021-12-01 ENCOUNTER — Ambulatory Visit: Payer: BC Managed Care – PPO | Admitting: Family

## 2021-12-01 ENCOUNTER — Other Ambulatory Visit (HOSPITAL_BASED_OUTPATIENT_CLINIC_OR_DEPARTMENT_OTHER): Payer: Self-pay

## 2021-12-01 VITALS — BP 151/81 | HR 64 | Temp 98.4°F | Resp 16 | Wt 189.0 lb

## 2021-12-01 DIAGNOSIS — K601 Chronic anal fissure: Secondary | ICD-10-CM | POA: Diagnosis not present

## 2021-12-01 DIAGNOSIS — R030 Elevated blood-pressure reading, without diagnosis of hypertension: Secondary | ICD-10-CM

## 2021-12-01 MED ORDER — HYDROCORT-PRAMOXINE (PERIANAL) 1-1 % EX FOAM
1.0000 | Freq: Two times a day (BID) | CUTANEOUS | 1 refills | Status: DC
  Filled 2021-12-01: qty 10, 19d supply, fill #0

## 2021-12-01 NOTE — Progress Notes (Addendum)
Subjective:   By signing my name below, I, Cassell Clement, attest that this documentation has been prepared under the direction and in the presence of Sandford Craze NP, 12/01/2021   Patient ID: John Faulkner, male    DOB: 1985-07-12, 36 y.o.   MRN: [id number]  Chief Complaint  Patient presents with   Fissure pain    "Pain last for hours after bowel movement and is severe"    HPI Patient is in today for an office visit.  Anal Fissure Pain - He complains of persistent rectum pain caused by a possible anal fissure that begun on 06/2021. He saw Dr. Milana Kidney on 06/28/2021 for the pain and was prescribed 0.25% of Nitroglycerin. Prior to last week, he would have pain during bowel movements but pain usually subsided after some minutes. As of today's visit, his pain has elevated. He states that the pain is so intense that he would cry. He currently consumes about 30 g of fiber and produces normal bowel movements. He still continues to use the Nitroglycerin but states that the pain is only dulled. He also states that he has oxycodone, hydrocodone and Tridol at home. He is currently taking stool softeners. He denies of any blood or pus in the stool. Denies fever.  Health Maintenance Due  Topic Date Due   Hepatitis C Screening  Never done   COVID-19 Vaccine (4 - Pfizer series) 07/25/2020    Past Medical History:  Diagnosis Date   Acute calculous cholecystitis 08/13/2011   Anxiety    Depression    Elevated blood pressure    Hypertension    Nephrotic syndrome 1989   Sleep apnea     Past Surgical History:  Procedure Laterality Date   CARPAL TUNNEL RELEASE Left    CHOLECYSTECTOMY  08/13/2011   Procedure: LAPAROSCOPIC CHOLECYSTECTOMY WITH INTRAOPERATIVE CHOLANGIOGRAM;  Surgeon: Rulon Abide, DO;  Location: [facility] OR;  Service: General;  Laterality: N/A;      LAPAROSCOPIC GASTRIC SLEEVE RESECTION N/A 10/10/2020   Procedure: LAPAROSCOPIC GASTRIC SLEEVE RESECTION;  Surgeon: Berna Bue, MD;  Location: [facility] ORS;  Service: General;  Laterality: N/A;   MANDIBLE OSTEOTOMY N/A 06/10/2021   Procedure: MANDIBULAR OSTEOTOMY;  Surgeon: Exie Parody, DMD;  Location: [facility] OR;  Service: Oral Surgery;  Laterality: N/A;   MAXILLA OSTEOTOMY N/A 06/10/2021   Procedure: LEFORT 1 MAXILLARY OSTEOTOMY;  Surgeon: Exie Parody, DMD;  Location: [facility] OR;  Service: Oral Surgery;  Laterality: N/A;   TONSILLECTOMY  06/26/1999   UPPER GI ENDOSCOPY N/A 10/10/2020   Procedure: UPPER GI ENDOSCOPY;  Surgeon: Berna Bue, MD;  Location: [facility];  Service: General;  Laterality: N/A;    Family History  Problem Relation Age of Onset   Pneumonia Mother        covid 1 pneumonia   COPD Father    Diabetes Father    Heart disease Father 42   Stroke Father    Colon polyps Father        in his late 41s   Diabetes Maternal Grandfather    Diabetes Paternal Grandfather    Colon cancer Neg Hx    Stomach cancer Neg Hx    Esophageal cancer Neg Hx    Pancreatic cancer Neg Hx     Social History   Socioeconomic History   Marital status: Married    Spouse name: Not on file   Number of children: Not on file   Years of education: Not on file  Highest education level: Not on file  Occupational History   Not on file  Tobacco Use   Smoking status: Never   Smokeless tobacco: Never  Vaping Use   Vaping Use: Never used  Substance and Sexual Activity   Alcohol use: No   Drug use: No   Sexual activity: Not on file  Other Topics Concern   Not on file  Social History Narrative   Work or School:       Home Situation: lives with wife, 2 children   Daughter 2014   Son 2018      Has a Medical laboratory scientific officer a Nurse, mental health      Spiritual Beliefs: none      Lifestyle: no regular exercise; poor      Works for a Tax inspector      Social Determinants of Corporate investment banker Strain: Not on file  Food Insecurity: Not on file  Transportation Needs: Not on file  Physical Activity: Not on  file  Stress: Not on file  Social Connections: Not on file  Intimate Partner Violence: Not on file    Outpatient Medications Prior to Visit  Medication Sig Dispense Refill   AMBULATORY NON FORMULARY MEDICATION nitroglycerin 0.125% gel- Apply a pea size amount to the rectum three times daily x 6-8 weeks. 30 g 1   amphetamine-dextroamphetamine (ADDERALL XR) 10 MG 24 hr capsule Take 1 capsule (10 mg total) by mouth daily. 30 capsule 0   amphetamine-dextroamphetamine (ADDERALL) 5 MG tablet Take 1 tab by mouth once daily in the afternoon 30 tablet 0   anastrozole (ARIMIDEX) 1 MG tablet Take 1 mg by mouth every Monday.     DHEA 50 MG CAPS Take 50 mg by mouth 3 (three) times a week.     escitalopram (LEXAPRO) 10 MG tablet TAKE 1 TABLET DAILY 90 tablet 3   Multiple Vitamins-Minerals (MULTIVITAMIN WITH MINERALS) tablet Take 1 tablet by mouth daily.     OVER THE COUNTER MEDICATION Take 1 capsule by mouth daily. DIM     vitamin B-12 (CYANOCOBALAMIN) 500 MCG tablet Take 1 tablet (500 mcg total) by mouth daily.     No facility-administered medications prior to visit.    No Known Allergies  Review of Systems  Gastrointestinal:  Negative for blood in stool.       (+) Rectum Pain       Objective:    Physical Exam Constitutional:      General: He is not in acute distress.    Appearance: Normal appearance. He is not ill-appearing.  HENT:     Head: Normocephalic and atraumatic.     Right Ear: External ear normal.     Left Ear: External ear normal.  Eyes:     Extraocular Movements: Extraocular movements intact.     Pupils: Pupils are equal, round, and reactive to light.  Cardiovascular:     Rate and Rhythm: Normal rate.  Pulmonary:     Effort: Pulmonary effort is normal.  Genitourinary:    Rectum: Anal fissure (Posterior, Tender) present.  Skin:    General: Skin is warm and dry.  Neurological:     Mental Status: He is alert and oriented to person, place, and time.  Psychiatric:         Mood and Affect: Mood normal.        Behavior: Behavior normal.        Judgment: Judgment normal.     BP (!) 151/81 (BP Location: Right Arm,  Patient Position: Sitting, Cuff Size: Small)   Pulse 64   Temp 98.4 F (36.9 C) (Oral)   Resp 16   Wt 189 lb (85.7 kg)   SpO2 100%   BMI 25.63 kg/m  Wt Readings from Last 3 Encounters:  12/01/21 189 lb (85.7 kg)  11/14/21 190 lb 1.6 oz (86.2 kg)  07/31/21 192 lb (87.1 kg)       Assessment & Plan:   Problem List Items Addressed This Visit       Unprioritized   Chronic rectal fissure - Primary    Uncontrolled pain.  He is doing well from a constipation standpoint with fiber and stool softeners.  He has upcoming appointment with Dr. Tomasa Randunningham (GI) in a few weeks but is quite miserable. I recommended addition of intrarectal proctofoam-HC.  Continue topical lidocaine and nitroglycerine.   Addendum: reviewed case with Dr. Tomasa Randunningham. He recommends surgical referral to discuss options including botox injections and sphincterotomy.       Relevant Orders   Ambulatory referral to General Surgery   Other Visit Diagnoses     Elevated blood pressure reading          BP is elevated today- likely due to pain. Monitor. BP Readings from Last 3 Encounters:  12/01/21 (!) 151/81  07/31/21 137/74  06/30/21 134/79    30 minutes spent on today's visit. Time was spent reviewing records and reviewing treatment options with pharmacist.   Meds ordered this encounter  Medications   hydrocortisone-pramoxine (PROCTOFOAM-HC) rectal foam    Sig: Place 1 applicator rectally 2 (two) times daily.    Dispense:  10 g    Refill:  1    Order Specific Question:   Supervising Provider    Answer:   Danise EdgeBLYTH, STACEY A [[id number]]    I, Lemont FillersMelissa S O'Sullivan, NP, personally preformed the services described in this documentation.  All medical record entries made by the scribe were at my direction and in my presence.  I have reviewed the chart and discharge  instructions (if applicable) and agree that the record reflects my personal performance and is accurate and complete. 12/01/2021   I,Amber Collins,acting as a scribe for Lemont FillersMelissa S O'Sullivan, NP.,have documented all relevant documentation on the behalf of Lemont FillersMelissa S O'Sullivan, NP,as directed by  Lemont FillersMelissa S O'Sullivan, NP while in the presence of Lemont FillersMelissa S O'Sullivan, NP.   Lemont FillersMelissa S O'Sullivan, NP

## 2021-12-01 NOTE — Assessment & Plan Note (Addendum)
Uncontrolled pain.  He is doing well from a constipation standpoint with fiber and stool softeners.  He has upcoming appointment with Dr. Tomasa Rand (GI) in a few weeks but is quite miserable. I recommended addition of intrarectal proctofoam-HC.  Continue topical lidocaine and nitroglycerine.   Addendum: reviewed case with Dr. Tomasa Rand. He recommends surgical referral to discuss options including botox injections and sphincterotomy.

## 2021-12-01 NOTE — Addendum Note (Signed)
Addended by: Sandford Craze on: 12/01/2021 02:20 PM   Modules accepted: Orders

## 2021-12-11 ENCOUNTER — Other Ambulatory Visit (HOSPITAL_BASED_OUTPATIENT_CLINIC_OR_DEPARTMENT_OTHER): Payer: Self-pay

## 2021-12-11 ENCOUNTER — Other Ambulatory Visit: Payer: Self-pay | Admitting: Family

## 2021-12-11 MED ORDER — AMPHETAMINE-DEXTROAMPHET ER 10 MG PO CP24
10.0000 mg | ORAL_CAPSULE | Freq: Every day | ORAL | 0 refills | Status: DC
  Filled 2021-12-11 – 2021-12-15 (×2): qty 30, 30d supply, fill #0

## 2021-12-11 NOTE — Telephone Encounter (Signed)
Requesting: Adderall XR 10mg  Contract: 02/20/21 UDS: 02/20/21 Last Visit: 12/01/21 Next Visit: 01/29/22 Last Refill: 11/13/21  Please Advise

## 2021-12-13 ENCOUNTER — Other Ambulatory Visit: Payer: Self-pay | Admitting: Family

## 2021-12-13 MED ORDER — AMPHETAMINE-DEXTROAMPHETAMINE 5 MG PO TABS: ORAL_TABLET | ORAL | 0 refills | Status: DC

## 2021-12-13 NOTE — Telephone Encounter (Signed)
Requesting: Adderall 5mg   Contract:02/20/21 UDS:02/20/21 Last Visit: 12/01/21 Next Visit: 01/29/22 Last Refill: 11/10/21 #30 and 0RF  Please Advise

## 2021-12-15 ENCOUNTER — Other Ambulatory Visit (HOSPITAL_BASED_OUTPATIENT_CLINIC_OR_DEPARTMENT_OTHER): Payer: Self-pay

## 2021-12-18 ENCOUNTER — Ambulatory Visit: Payer: BC Managed Care – PPO | Admitting: Gastroenterology

## 2021-12-25 DIAGNOSIS — K601 Chronic anal fissure: Secondary | ICD-10-CM | POA: Diagnosis not present

## 2021-12-25 DIAGNOSIS — Z903 Acquired absence of stomach [part of]: Secondary | ICD-10-CM | POA: Diagnosis not present

## 2021-12-25 DIAGNOSIS — K5900 Constipation, unspecified: Secondary | ICD-10-CM | POA: Diagnosis not present

## 2022-01-01 ENCOUNTER — Ambulatory Visit: Payer: BC Managed Care – PPO | Admitting: Skilled Nursing Facility1

## 2022-01-10 ENCOUNTER — Other Ambulatory Visit: Payer: Self-pay | Admitting: Family

## 2022-01-10 ENCOUNTER — Other Ambulatory Visit (HOSPITAL_BASED_OUTPATIENT_CLINIC_OR_DEPARTMENT_OTHER): Payer: Self-pay

## 2022-01-10 MED ORDER — AMPHETAMINE-DEXTROAMPHET ER 10 MG PO CP24
10.0000 mg | ORAL_CAPSULE | Freq: Every day | ORAL | 0 refills | Status: DC
  Filled 2022-01-10 – 2022-01-18 (×2): qty 30, 30d supply, fill #0

## 2022-01-16 ENCOUNTER — Other Ambulatory Visit: Payer: Self-pay | Admitting: Family

## 2022-01-17 DIAGNOSIS — Z125 Encounter for screening for malignant neoplasm of prostate: Secondary | ICD-10-CM | POA: Diagnosis not present

## 2022-01-17 DIAGNOSIS — R5383 Other fatigue: Secondary | ICD-10-CM | POA: Diagnosis not present

## 2022-01-17 DIAGNOSIS — R7989 Other specified abnormal findings of blood chemistry: Secondary | ICD-10-CM | POA: Diagnosis not present

## 2022-01-17 DIAGNOSIS — E291 Testicular hypofunction: Secondary | ICD-10-CM | POA: Diagnosis not present

## 2022-01-17 MED ORDER — AMPHETAMINE-DEXTROAMPHETAMINE 5 MG PO TABS: ORAL_TABLET | ORAL | 0 refills | Status: DC

## 2022-01-17 NOTE — Telephone Encounter (Signed)
Requesting: Adderall 5mg   Contract: 02/20/21 UDS: 02/20/21 Last Visit: 12/01/21 Next Visit: 01/29/22 Last Refill: 12/13/21 #30 and 0RF  Please Advise

## 2022-01-18 ENCOUNTER — Other Ambulatory Visit (HOSPITAL_BASED_OUTPATIENT_CLINIC_OR_DEPARTMENT_OTHER): Payer: Self-pay

## 2022-01-24 DIAGNOSIS — E291 Testicular hypofunction: Secondary | ICD-10-CM | POA: Diagnosis not present

## 2022-01-24 DIAGNOSIS — R6882 Decreased libido: Secondary | ICD-10-CM | POA: Diagnosis not present

## 2022-01-24 DIAGNOSIS — N529 Male erectile dysfunction, unspecified: Secondary | ICD-10-CM | POA: Diagnosis not present

## 2022-01-24 DIAGNOSIS — Z6825 Body mass index (BMI) 25.0-25.9, adult: Secondary | ICD-10-CM | POA: Diagnosis not present

## 2022-01-29 ENCOUNTER — Encounter: Payer: Self-pay | Admitting: Family

## 2022-01-29 ENCOUNTER — Ambulatory Visit (INDEPENDENT_AMBULATORY_CARE_PROVIDER_SITE_OTHER): Payer: BC Managed Care – PPO | Admitting: Family

## 2022-01-29 DIAGNOSIS — F909 Attention-deficit hyperactivity disorder, unspecified type: Secondary | ICD-10-CM | POA: Diagnosis not present

## 2022-01-29 DIAGNOSIS — K601 Chronic anal fissure: Secondary | ICD-10-CM

## 2022-01-29 DIAGNOSIS — E663 Overweight: Secondary | ICD-10-CM | POA: Diagnosis not present

## 2022-01-29 DIAGNOSIS — F418 Other specified anxiety disorders: Secondary | ICD-10-CM

## 2022-01-29 DIAGNOSIS — I10 Essential (primary) hypertension: Secondary | ICD-10-CM | POA: Diagnosis not present

## 2022-01-29 DIAGNOSIS — E291 Testicular hypofunction: Secondary | ICD-10-CM

## 2022-01-29 MED ORDER — CLOMID 50 MG PO TABS: 25.0000 mg | ORAL_TABLET | Freq: Every day | ORAL | Status: DC

## 2022-01-29 NOTE — Assessment & Plan Note (Signed)
This is being managed by Delrae Rend MD with Anastrazole and clomid.

## 2022-01-29 NOTE — Assessment & Plan Note (Addendum)
Stable on Adderall.  Will continue current regimin of 10mg  xr in am and 5mg  in PM. Controlled substance contract is updated and UDS will be obtained.

## 2022-01-29 NOTE — Progress Notes (Signed)
Subjective:   By signing my name below, I, [name], attest that this documentation has been prepared under the direction and in the presence of Sandford Craze, NP 01/29/2022      Patient ID: John Faulkner, male    DOB: 1985-12-16, 36 y.o.   MRN: [id number]  Chief Complaint  Patient presents with   Hypertension    Here for follow up   ADHD    Here for follow up    HPI Patient is in today for a follow up visit.   Insomnia- He complains of mild difficulty falling asleep. He thinks its from his kids giving him trouble.  Fissure pain- He has an upcomming procedure this month to manage his symptoms. His pain has improved since he first started experiencing it but he still finds the pain to be too much.  Constipation- He continues experiencing constipation. 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  Adderall- He continues taking adderall and reports doing well while taking it. He notices occasional difficulty focusing while at work, but overall feels his dose is effective.  Mood- He continues taking 10 mg lexapro and reports doing well while taking it.  Weight- He is no longer morbidly obese. He continues managing his weight well at this time.  Anastrozole- He continues taking 1 mg anastrozole weekly PO.  Supplements- He continues taking 50 mg DHEA supplements 3x weekly PO, multivitamins daily and reports no new issues while taking them.    Health Maintenance Due  Topic Date Due   Hepatitis C Screening  Never done   COVID-19 Vaccine (4 - Pfizer series) 07/25/2020   INFLUENZA VACCINE  01/23/2022    Past Medical History:  Diagnosis Date   Acute calculous cholecystitis 08/13/2011   Anxiety    Depression    Elevated blood pressure    Hypertension    Nephrotic syndrome 1989   Sleep apnea     Past Surgical History:  Procedure Laterality Date   CARPAL TUNNEL RELEASE Left    CHOLECYSTECTOMY  08/13/2011   Procedure: LAPAROSCOPIC  CHOLECYSTECTOMY WITH INTRAOPERATIVE CHOLANGIOGRAM;  Surgeon: Rulon Abide, DO;  Location: [facility] OR;  Service: General;  Laterality: N/A;      LAPAROSCOPIC GASTRIC SLEEVE RESECTION N/A 10/10/2020   Procedure: LAPAROSCOPIC GASTRIC SLEEVE RESECTION;  Surgeon: Berna Bue, MD;  Location: [facility] ORS;  Service: General;  Laterality: N/A;   MANDIBLE OSTEOTOMY N/A 06/10/2021   Procedure: MANDIBULAR OSTEOTOMY;  Surgeon: Exie Parody, DMD;  Location: [facility] OR;  Service: Oral Surgery;  Laterality: N/A;   MAXILLA OSTEOTOMY N/A 06/10/2021   Procedure: LEFORT 1 MAXILLARY OSTEOTOMY;  Surgeon: Exie Parody, DMD;  Location: [facility] OR;  Service: Oral Surgery;  Laterality: N/A;   TONSILLECTOMY  06/26/1999   UPPER GI ENDOSCOPY N/A 10/10/2020   Procedure: UPPER GI ENDOSCOPY;  Surgeon: Berna Bue, MD;  Location: [facility] ORS;  Service: General;  Laterality: N/A;    Family History  Problem Relation Age of Onset   Pneumonia Mother        covid 41 pneumonia   COPD Father    Diabetes Father    Heart disease Father 59   Stroke Father    Colon polyps Father        in his late 105s   Diabetes Maternal Grandfather    Diabetes Paternal Grandfather    Colon cancer Neg Hx    Stomach cancer Neg Hx    Esophageal cancer Neg Hx  Pancreatic cancer Neg Hx     Social History   Socioeconomic History   Marital status: Married    Spouse name: Not on file   Number of children: Not on file   Years of education: Not on file   Highest education level: Not on file  Occupational History   Not on file  Tobacco Use   Smoking status: Never   Smokeless tobacco: Never  Vaping Use   Vaping Use: Never used  Substance and Sexual Activity   Alcohol use: No   Drug use: No   Sexual activity: Not on file  Other Topics Concern   Not on file  Social History Narrative   Work or School:       Home Situation: lives with wife, 2 children   Daughter 2014   Son 2018      Has a cat a Nurse, mental health      Spiritual Beliefs:  none      Lifestyle: no regular exercise; poor      Works for a Tax inspector      Social Determinants of Corporate investment banker Strain: Not on file  Food Insecurity: Not on file  Transportation Needs: Not on file  Physical Activity: Not on file  Stress: Not on file  Social Connections: Not on file  Intimate Partner Violence: Not on file    Outpatient Medications Prior to Visit  Medication Sig Dispense Refill   amphetamine-dextroamphetamine (ADDERALL XR) 10 MG 24 hr capsule Take 1 capsule (10 mg total) by mouth daily. 30 capsule 0   amphetamine-dextroamphetamine (ADDERALL) 5 MG tablet Take 1 tab by mouth once daily in the afternoon 30 tablet 0   anastrozole (ARIMIDEX) 1 MG tablet Take 1 mg by mouth every Monday.     DHEA 50 MG CAPS Take 50 mg by mouth 3 (three) times a week.     escitalopram (LEXAPRO) 10 MG tablet TAKE 1 TABLET DAILY 90 tablet 3   Multiple Vitamins-Minerals (MULTIVITAMIN WITH MINERALS) tablet Take 1 tablet by mouth daily.     OVER THE COUNTER MEDICATION Take 1 capsule by mouth daily. DIM     vitamin B-12 (CYANOCOBALAMIN) 500 MCG tablet Take 1 tablet (500 mcg total) by mouth daily.     AMBULATORY NON FORMULARY MEDICATION nitroglycerin 0.125% gel- Apply a pea size amount to the rectum three times daily x 6-8 weeks. 30 g 1   hydrocortisone-pramoxine (PROCTOFOAM-HC) rectal foam Place 1 applicator rectally 2 (two) times daily. 10 g 1   No facility-administered medications prior to visit.    No Known Allergies  Review of Systems  Psychiatric/Behavioral:  The patient has insomnia (mild).        Objective:    Physical Exam Constitutional:      General: He is not in acute distress.    Appearance: Normal appearance. He is not ill-appearing.  HENT:     Head: Normocephalic and atraumatic.     Right Ear: External ear normal.     Left Ear: External ear normal.  Eyes:     Extraocular Movements: Extraocular movements intact.     Pupils:  Pupils are equal, round, and reactive to light.  Cardiovascular:     Rate and Rhythm: Normal rate and regular rhythm.     Heart sounds: Normal heart sounds. No murmur heard.    No gallop.  Pulmonary:     Effort: Pulmonary effort is normal. No respiratory distress.     Breath sounds: Normal breath  sounds. No wheezing or rales.  Skin:    General: Skin is warm and dry.  Neurological:     Mental Status: He is alert and oriented to person, place, and time.  Psychiatric:        Judgment: Judgment normal.     BP 134/70 (BP Location: Right Arm, Patient Position: Sitting, Cuff Size: Small)   Pulse 62   Temp 98.4 F (36.9 C) (Oral)   Resp 16   Wt 195 lb (88.5 kg)   SpO2 100%   BMI 26.45 kg/m  Wt Readings from Last 3 Encounters:  01/29/22 195 lb (88.5 kg)  12/01/21 189 lb (85.7 kg)  11/14/21 190 lb 1.6 oz (86.2 kg)       Assessment & Plan:   Problem List Items Addressed This Visit       Unprioritized   Hypogonadism in male (Chronic)    This is being managed by Delrae Rend MD with Anastrazole and clomid.       Overweight (BMI 25.0-29.9)    Wt Readings from Last 3 Encounters:  01/29/22 195 lb (88.5 kg)  12/01/21 189 lb (85.7 kg)  11/14/21 190 lb 1.6 oz (86.2 kg)        Hypertension    BP Readings from Last 3 Encounters:  01/29/22 134/70  12/01/21 (!) 151/81  07/31/21 137/74  Stable without medication.       Depression with anxiety    Stable mood on lexapro 10mg . Continue same.       Chronic rectal fissure    He is scheduled for surgery at the end of the month.       ADHD (attention deficit hyperactivity disorder)    Stable on Adderall.  Will continue current regimin of 10mg  xr in am and 5mg  in PM. Controlled substance contract is updated and UDS will be obtained.       Relevant Orders   Drug Monitoring Panel , Urine     Meds ordered this encounter  Medications   clomiPHENE (CLOMID) 50 MG tablet    Sig: Take 0.5 tablets (25 mg total) by mouth daily.     Order Specific Question:   Supervising Provider    Answer:   [[id number]]    I, , NP, personally preformed the services described in this documentation.  All medical record entries made by the scribe were at my direction and in my presence.  I have reviewed the chart and discharge instructions (if applicable) and agree that the record reflects my personal performance and is accurate and complete. 088/12/2021   I,[name],acting as a scribe for Bradd Canary, NP.,have documented all relevant documentation on the behalf of Lemont Fillers, NP,as directed by  03/31/2022, NP while in the presence of Lemont Fillers, NP.   Lemont Fillers, NP

## 2022-01-29 NOTE — Assessment & Plan Note (Signed)
He is scheduled for surgery at the end of the month.

## 2022-01-29 NOTE — Assessment & Plan Note (Signed)
BP Readings from Last 3 Encounters:  01/29/22 134/70  12/01/21 (!) 151/81  07/31/21 137/74   Stable without medication.

## 2022-01-29 NOTE — Assessment & Plan Note (Signed)
>>  ASSESSMENT AND PLAN FOR DEPRESSION WITH ANXIETY WRITTEN ON 01/29/2022  8:54 AM BY O'SULLIVAN, MELISSA, NP  Stable mood on lexapro  10mg . Continue same.

## 2022-01-29 NOTE — Assessment & Plan Note (Signed)
Wt Readings from Last 3 Encounters:  01/29/22 195 lb (88.5 kg)  12/01/21 189 lb (85.7 kg)  11/14/21 190 lb 1.6 oz (86.2 kg)

## 2022-01-29 NOTE — Assessment & Plan Note (Signed)
Stable mood on lexapro 10mg . Continue same.

## 2022-01-31 LAB — DRUG MONITORING PANEL 375977 , URINE
Alcohol Metabolites: NEGATIVE ng/mL (ref <500)
Amphetamine: 791 ng/mL — ABNORMAL HIGH (ref <250)
Amphetamines: POSITIVE ng/mL — AB (ref <500)
Barbiturates: NEGATIVE ng/mL (ref <300)
Benzodiazepines: NEGATIVE ng/mL (ref <100)
Cocaine Metabolite: NEGATIVE ng/mL (ref <150)
Desmethyltramadol: NEGATIVE ng/mL (ref <100)
Marijuana Metabolite: NEGATIVE ng/mL (ref <20)
Methamphetamine: NEGATIVE ng/mL (ref <250)
Opiates: NEGATIVE ng/mL (ref <100)
Oxycodone: NEGATIVE ng/mL (ref <100)
Tramadol: NEGATIVE ng/mL (ref <100)

## 2022-01-31 LAB — DM TEMPLATE

## 2022-02-05 ENCOUNTER — Ambulatory Visit: Payer: Self-pay | Admitting: Surgery

## 2022-02-05 ENCOUNTER — Encounter (HOSPITAL_BASED_OUTPATIENT_CLINIC_OR_DEPARTMENT_OTHER): Payer: Self-pay | Admitting: Surgery

## 2022-02-06 ENCOUNTER — Other Ambulatory Visit: Payer: Self-pay

## 2022-02-06 ENCOUNTER — Encounter (HOSPITAL_BASED_OUTPATIENT_CLINIC_OR_DEPARTMENT_OTHER): Payer: Self-pay | Admitting: Surgery

## 2022-02-06 NOTE — Progress Notes (Signed)
Spoke w/ via phone for pre-op interview---pt Lab needs dos----   none            Lab results------ COVID test -----patient states asymptomatic no test needed Arrive at -------530 am 02-16-2022 NPO after MN NO Solid Food.  Clear liquids from MN until---430 am Med rec completed Medications to take morning of surgery -----Clomid, Escitalopram Diabetic medication -----n/a Patient instructed no nail polish to be worn day of surgery Patient instructed to bring photo id and insurance card day of surgery Patient aware to have Driver (ride ) / caregiver    wife stacy for 24 hours after surgery  Patient Special Instructions -----follow all bowel prep instructions from dr gross Pre-Op special Istructions -----pt wishes to not pick up ensure presurgery drink Patient verbalized understanding of instructions that were given at this phone interview. Patient denies shortness of breath, chest pain, fever, cough at this phone interview.

## 2022-02-15 ENCOUNTER — Other Ambulatory Visit (HOSPITAL_BASED_OUTPATIENT_CLINIC_OR_DEPARTMENT_OTHER): Payer: Self-pay

## 2022-02-15 ENCOUNTER — Other Ambulatory Visit: Payer: Self-pay | Admitting: Family

## 2022-02-15 MED ORDER — AMPHETAMINE-DEXTROAMPHET ER 10 MG PO CP24
10.0000 mg | ORAL_CAPSULE | Freq: Every day | ORAL | 0 refills | Status: DC
  Filled 2022-02-15 – 2022-02-22 (×2): qty 30, 30d supply, fill #0

## 2022-02-15 NOTE — Progress Notes (Signed)
Called and spoke with patient, pt aware surgery time changed,  arrive 530 am, clear liquids from midnight until 430 am then npo.

## 2022-02-15 NOTE — Progress Notes (Signed)
Called patient and left message regarding time change for his surgery on 02/16/22. Patient arrival time changed to 1000 in the morning.

## 2022-02-15 NOTE — Telephone Encounter (Signed)
Requesting: Adderall XR 10mg   Contract:01/29/22 UDS:01/29/22 Last Visit: 01/29/22 Next Visit:07/30/22 Last Refill: 01/10/22 #30 and 0RF  Please Advise

## 2022-02-16 ENCOUNTER — Other Ambulatory Visit: Payer: Self-pay

## 2022-02-16 ENCOUNTER — Encounter (HOSPITAL_BASED_OUTPATIENT_CLINIC_OR_DEPARTMENT_OTHER): Payer: Self-pay | Admitting: Surgery

## 2022-02-16 ENCOUNTER — Ambulatory Visit (HOSPITAL_BASED_OUTPATIENT_CLINIC_OR_DEPARTMENT_OTHER): Payer: BC Managed Care – PPO | Admitting: Anesthesiology

## 2022-02-16 ENCOUNTER — Ambulatory Visit (HOSPITAL_BASED_OUTPATIENT_CLINIC_OR_DEPARTMENT_OTHER)
Admission: RE | Admit: 2022-02-16 | Discharge: 2022-02-16 | Disposition: A | Discharge status: Home / Self Care | Payer: BC Managed Care – PPO | Attending: Surgery | Admitting: Surgery

## 2022-02-16 ENCOUNTER — Encounter (HOSPITAL_BASED_OUTPATIENT_CLINIC_OR_DEPARTMENT_OTHER)
Admission: RE | Disposition: A | Discharge status: Home / Self Care | Payer: Self-pay | Source: Home / Self Care | Attending: Surgery

## 2022-02-16 DIAGNOSIS — K642 Third degree hemorrhoids: Secondary | ICD-10-CM | POA: Diagnosis not present

## 2022-02-16 DIAGNOSIS — N189 Chronic kidney disease, unspecified: Secondary | ICD-10-CM | POA: Diagnosis not present

## 2022-02-16 DIAGNOSIS — K601 Chronic anal fissure: Secondary | ICD-10-CM | POA: Diagnosis not present

## 2022-02-16 DIAGNOSIS — Z9049 Acquired absence of other specified parts of digestive tract: Secondary | ICD-10-CM | POA: Insufficient documentation

## 2022-02-16 DIAGNOSIS — F418 Other specified anxiety disorders: Secondary | ICD-10-CM | POA: Diagnosis not present

## 2022-02-16 DIAGNOSIS — Z9884 Bariatric surgery status: Secondary | ICD-10-CM | POA: Insufficient documentation

## 2022-02-16 DIAGNOSIS — K602 Anal fissure, unspecified: Secondary | ICD-10-CM | POA: Diagnosis not present

## 2022-02-16 DIAGNOSIS — I129 Hypertensive chronic kidney disease with stage 1 through stage 4 chronic kidney disease, or unspecified chronic kidney disease: Secondary | ICD-10-CM | POA: Insufficient documentation

## 2022-02-16 DIAGNOSIS — K5900 Constipation, unspecified: Secondary | ICD-10-CM | POA: Diagnosis not present

## 2022-02-16 DIAGNOSIS — Z01818 Encounter for other preprocedural examination: Secondary | ICD-10-CM

## 2022-02-16 DIAGNOSIS — K641 Second degree hemorrhoids: Secondary | ICD-10-CM | POA: Diagnosis not present

## 2022-02-16 HISTORY — DX: Anal fissure, unspecified: K60.2

## 2022-02-16 HISTORY — DX: Testicular hypofunction: E29.1

## 2022-02-16 HISTORY — PX: EVALUATION UNDER ANESTHESIA WITH HEMORRHOIDECTOMY: SHX5624

## 2022-02-16 HISTORY — DX: Personal history of other diseases of the nervous system and sense organs: Z86.69

## 2022-02-16 HISTORY — DX: Personal history of other diseases of the circulatory system: Z86.79

## 2022-02-16 HISTORY — PX: SPHINCTEROTOMY: SHX5279

## 2022-02-16 HISTORY — DX: Attention-deficit hyperactivity disorder, unspecified type: F90.9

## 2022-02-16 SURGERY — SPHINCTEROTOMY, ANAL
Anesthesia: General | Site: Rectum

## 2022-02-16 MED ORDER — BUPIVACAINE-EPINEPHRINE (PF) 0.25% -1:200000 IJ SOLN
INTRAMUSCULAR | Status: AC
  Filled 2022-02-16: qty 30

## 2022-02-16 MED ORDER — CHLORHEXIDINE GLUCONATE CLOTH 2 % EX PADS: 6.0000 | MEDICATED_PAD | Freq: Once | CUTANEOUS | Status: DC

## 2022-02-16 MED ORDER — CEFTRIAXONE SODIUM 2 G IJ SOLR
INTRAMUSCULAR | Status: AC
  Filled 2022-02-16: qty 20

## 2022-02-16 MED ORDER — PROPOFOL 10 MG/ML IV BOLUS
INTRAVENOUS | Status: DC | PRN
  Administered 2022-02-16: 180 mg via INTRAVENOUS
  Administered 2022-02-16: 20 mg via INTRAVENOUS

## 2022-02-16 MED ORDER — DIBUCAINE 1 % EX OINT
TOPICAL_OINTMENT | CUTANEOUS | Status: DC | PRN
  Administered 2022-02-16: 1

## 2022-02-16 MED ORDER — DEXMEDETOMIDINE (PRECEDEX) IN NS 20 MCG/5ML (4 MCG/ML) IV SYRINGE
PREFILLED_SYRINGE | INTRAVENOUS | Status: DC | PRN
  Administered 2022-02-16: 8 ug via INTRAVENOUS

## 2022-02-16 MED ORDER — CELECOXIB 200 MG PO CAPS
200.0000 mg | ORAL_CAPSULE | ORAL | Status: AC
  Administered 2022-02-16: 200 mg via ORAL

## 2022-02-16 MED ORDER — FENTANYL CITRATE (PF) 250 MCG/5ML IJ SOLN
INTRAMUSCULAR | Status: DC | PRN
  Administered 2022-02-16: 25 ug via INTRAVENOUS
  Administered 2022-02-16 (×2): 50 ug via INTRAVENOUS

## 2022-02-16 MED ORDER — ROCURONIUM BROMIDE 10 MG/ML (PF) SYRINGE
PREFILLED_SYRINGE | INTRAVENOUS | Status: DC | PRN
  Administered 2022-02-16: 60 mg via INTRAVENOUS

## 2022-02-16 MED ORDER — FENTANYL CITRATE (PF) 100 MCG/2ML IJ SOLN: 25.0000 ug | INTRAMUSCULAR | Status: DC | PRN

## 2022-02-16 MED ORDER — DIAZEPAM 5 MG PO TABS: 5.0000 mg | ORAL_TABLET | Freq: Three times a day (TID) | ORAL | 1 refills | Status: DC | PRN

## 2022-02-16 MED ORDER — GABAPENTIN 300 MG PO CAPS
300.0000 mg | ORAL_CAPSULE | ORAL | Status: AC
  Administered 2022-02-16: 300 mg via ORAL

## 2022-02-16 MED ORDER — PROPOFOL 1000 MG/100ML IV EMUL
INTRAVENOUS | Status: AC
  Filled 2022-02-16: qty 100

## 2022-02-16 MED ORDER — ENSURE PRE-SURGERY PO LIQD: 296.0000 mL | Freq: Once | ORAL | Status: DC

## 2022-02-16 MED ORDER — DIBUCAINE (PERIANAL) 1 % EX OINT
TOPICAL_OINTMENT | CUTANEOUS | Status: AC
  Filled 2022-02-16: qty 28

## 2022-02-16 MED ORDER — BUPIVACAINE-EPINEPHRINE 0.25% -1:200000 IJ SOLN
INTRAMUSCULAR | Status: DC | PRN
  Administered 2022-02-16: 30 mL

## 2022-02-16 MED ORDER — ONDANSETRON HCL 4 MG/2ML IJ SOLN
INTRAMUSCULAR | Status: DC | PRN
  Administered 2022-02-16: 4 mg via INTRAVENOUS

## 2022-02-16 MED ORDER — SODIUM CHLORIDE 0.9 % IV SOLN: INTRAVENOUS | Status: DC

## 2022-02-16 MED ORDER — SUGAMMADEX SODIUM 200 MG/2ML IV SOLN
INTRAVENOUS | Status: DC | PRN
  Administered 2022-02-16: 200 mg via INTRAVENOUS

## 2022-02-16 MED ORDER — SODIUM CHLORIDE 0.9 % IV SOLN
2.0000 g | INTRAVENOUS | Status: AC
  Administered 2022-02-16: 2 g via INTRAVENOUS

## 2022-02-16 MED ORDER — OXYCODONE HCL 5 MG PO TABS: 5.0000 mg | ORAL_TABLET | Freq: Four times a day (QID) | ORAL | 0 refills | Status: DC | PRN

## 2022-02-16 MED ORDER — MIDAZOLAM HCL 2 MG/2ML IJ SOLN
INTRAMUSCULAR | Status: DC | PRN
  Administered 2022-02-16: 2 mg via INTRAVENOUS

## 2022-02-16 MED ORDER — PROPOFOL 10 MG/ML IV BOLUS
INTRAVENOUS | Status: AC
  Filled 2022-02-16: qty 20

## 2022-02-16 MED ORDER — DEXAMETHASONE SODIUM PHOSPHATE 10 MG/ML IJ SOLN
INTRAMUSCULAR | Status: DC | PRN
  Administered 2022-02-16: 10 mg via INTRAVENOUS

## 2022-02-16 MED ORDER — BUPIVACAINE LIPOSOME 1.3 % IJ SUSP: 20.0000 mL | Freq: Once | INTRAMUSCULAR | Status: DC

## 2022-02-16 MED ORDER — ACETAMINOPHEN 500 MG PO TABS
1000.0000 mg | ORAL_TABLET | ORAL | Status: AC
  Administered 2022-02-16: 1000 mg via ORAL

## 2022-02-16 MED ORDER — FENTANYL CITRATE (PF) 100 MCG/2ML IJ SOLN
INTRAMUSCULAR | Status: AC
  Filled 2022-02-16: qty 2

## 2022-02-16 MED ORDER — LIDOCAINE 2% (20 MG/ML) 5 ML SYRINGE
INTRAMUSCULAR | Status: DC | PRN
  Administered 2022-02-16: 80 mg via INTRAVENOUS

## 2022-02-16 MED ORDER — CELECOXIB 200 MG PO CAPS
ORAL_CAPSULE | ORAL | Status: AC
  Filled 2022-02-16: qty 1

## 2022-02-16 MED ORDER — MIDAZOLAM HCL 2 MG/2ML IJ SOLN
INTRAMUSCULAR | Status: AC
  Filled 2022-02-16: qty 2

## 2022-02-16 MED ORDER — BUPIVACAINE LIPOSOME 1.3 % IJ SUSP
INTRAMUSCULAR | Status: DC | PRN
  Administered 2022-02-16: 20 mL

## 2022-02-16 MED ORDER — GABAPENTIN 300 MG PO CAPS
ORAL_CAPSULE | ORAL | Status: AC
  Filled 2022-02-16: qty 1

## 2022-02-16 MED ORDER — SODIUM CHLORIDE 0.9 % IV SOLN
INTRAVENOUS | Status: AC
  Filled 2022-02-16: qty 100

## 2022-02-16 MED ORDER — BUPIVACAINE LIPOSOME 1.3 % IJ SUSP
INTRAMUSCULAR | Status: AC
  Filled 2022-02-16: qty 20

## 2022-02-16 MED ORDER — ACETAMINOPHEN 500 MG PO TABS
ORAL_TABLET | ORAL | Status: AC
  Filled 2022-02-16: qty 2

## 2022-02-16 SURGICAL SUPPLY — 54 items
APL SKNCLS STERI-STRIP NONHPOA (GAUZE/BANDAGES/DRESSINGS) ×1
BENZOIN TINCTURE PRP APPL 2/3 (GAUZE/BANDAGES/DRESSINGS) ×1 IMPLANT
BLADE SURG 10 STRL SS (BLADE) IMPLANT
BLADE SURG 15 STRL LF DISP TIS (BLADE) ×1 IMPLANT
BLADE SURG 15 STRL SS (BLADE) ×1
CANISTER SUCT 1200ML W/VALVE (MISCELLANEOUS) ×1 IMPLANT
COVER BACK TABLE 60X90IN (DRAPES) ×1 IMPLANT
COVER MAYO STAND STRL (DRAPES) ×1 IMPLANT
DECANTER SPIKE VIAL GLASS SM (MISCELLANEOUS) ×1 IMPLANT
DRAPE HYSTEROSCOPY (MISCELLANEOUS) IMPLANT
DRAPE LAPAROTOMY 100X72 PEDS (DRAPES) ×1 IMPLANT
DRAPE SHEET LG 3/4 BI-LAMINATE (DRAPES) IMPLANT
DRSG PAD ABDOMINAL 8X10 ST (GAUZE/BANDAGES/DRESSINGS) ×1 IMPLANT
ELECT NDL TIP 2.8 STRL (NEEDLE) IMPLANT
ELECT NEEDLE TIP 2.8 STRL (NEEDLE) IMPLANT
ELECT REM PT RETURN 9FT ADLT (ELECTROSURGICAL) ×1
ELECTRODE REM PT RTRN 9FT ADLT (ELECTROSURGICAL) ×1 IMPLANT
FILTER STRAW (MISCELLANEOUS) ×1 IMPLANT
GAUZE 4X4 16PLY ~~LOC~~+RFID DBL (SPONGE) ×1 IMPLANT
GLOVE BIOGEL PI IND STRL 8 (GLOVE) ×1 IMPLANT
GLOVE BIOGEL PI INDICATOR 8 (GLOVE) ×1
GLOVE ECLIPSE 8.0 STRL XLNG CF (GLOVE) ×1 IMPLANT
GOWN STRL REUS W/TWL XL LVL3 (GOWN DISPOSABLE) ×1 IMPLANT
IV CATH PLACEMENT 20 GA (IV SOLUTION) ×1 IMPLANT
KIT SIGMOIDOSCOPE (SET/KITS/TRAYS/PACK) IMPLANT
KIT TURNOVER CYSTO (KITS) ×1 IMPLANT
LEGGING LITHOTOMY PAIR STRL (DRAPES) IMPLANT
NEEDLE HYPO 22GX1.5 SAFETY (NEEDLE) ×1 IMPLANT
NS IRRIG 500ML POUR BTL (IV SOLUTION) ×1 IMPLANT
PACK BASIN DAY SURGERY FS (CUSTOM PROCEDURE TRAY) ×1 IMPLANT
PAD PREP 24X48 CUFFED NSTRL (MISCELLANEOUS) IMPLANT
PANTS MESH DISP LRG (UNDERPADS AND DIAPERS) ×1 IMPLANT
PENCIL SMOKE EVACUATOR (MISCELLANEOUS) ×1 IMPLANT
SCRUB TECHNI CARE 4 OZ NO DYE (MISCELLANEOUS) ×1 IMPLANT
SHEARS HARMONIC 9CM CVD (BLADE) IMPLANT
SURGILUBE 2OZ TUBE FLIPTOP (MISCELLANEOUS) ×1 IMPLANT
SUT CHROMIC 2 0 SH (SUTURE) IMPLANT
SUT CHROMIC 3 0 SH 27 (SUTURE) IMPLANT
SUT VIC AB 2-0 SH 27 (SUTURE)
SUT VIC AB 2-0 SH 27XBRD (SUTURE) IMPLANT
SUT VIC AB 2-0 UR6 27 (SUTURE) IMPLANT
SUT VICRYL 0 UR6 27IN ABS (SUTURE) IMPLANT
SUT VICRYL AB 2 0 TIE (SUTURE) IMPLANT
SUT VICRYL AB 2 0 TIES (SUTURE)
SYR 20ML LL LF (SYRINGE) ×1 IMPLANT
SYR 27GX1/2 1ML LL SAFETY (SYRINGE) ×1 IMPLANT
SYR BULB IRRIG 60ML STRL (SYRINGE) ×1 IMPLANT
SYR CONTROL 10ML LL (SYRINGE) IMPLANT
TAPE CLOTH 3X10 TAN LF (GAUZE/BANDAGES/DRESSINGS) ×1 IMPLANT
TOWEL OR 17X26 10 PK STRL BLUE (TOWEL DISPOSABLE) ×2 IMPLANT
TRAY DSU PREP LF (CUSTOM PROCEDURE TRAY) ×1 IMPLANT
TUBE CONNECTING 12X1/4 (SUCTIONS) ×1 IMPLANT
UNDERPAD 30X36 HEAVY ABSORB (UNDERPADS AND DIAPERS) ×1 IMPLANT
YANKAUER SUCT BULB TIP NO VENT (SUCTIONS) ×1 IMPLANT

## 2022-02-16 NOTE — Op Note (Signed)
02/16/2022  8:37 AM  PATIENT:  John Faulkner  36 y.o. male  Patient Care Team: Sandford Craze, NP as PCP - General (Internal Medicine) Karie Soda, MD as Consulting Physician (General Surgery) Jenel Lucks, MD as Consulting Physician (Gastroenterology)  PRE-OPERATIVE DIAGNOSIS:  Anal fissure refractory to medical  management  POST-OPERATIVE DIAGNOSIS:   Anal fissure refractory to medical  management Grade 3 and 2 internal hemorrhoids with prolapse and irritation  PROCEDURE:   Anal internal partial left lateral sphincterotomy  Internal hemorrhoidectomy x1 Hemorrhoidal ligation and pexy Anorectal examination under anesthesia  SURGEON:  Ardeth Sportsman, MD  ASSISTANT: none   ANESTHESIA:   General Anorectal & Local field block (0.25% bupivacaine with epinephrine mixed with Liposomal bupivacaine (Experel)   EBL:  Total I/O In: 700 [I.V.:700] Out: 30 [Blood:30]  Delay start of Pharmacological VTE agent (>24hrs) due to surgical blood loss or risk of bleeding:  no  DRAINS: none   SPECIMEN: Left lateral hemorrhoid  DISPOSITION OF SPECIMEN:  PATHOLOGY  COUNTS:  YES  PLAN OF CARE: Discharge to home after PACU  PATIENT DISPOSITION:  PACU - hemodynamically stable.  INDICATION: Patient with probable chronic anal fissure refractory to bowel regimen & medical management.  I recommended examination and surgical treatment:  The anatomy & physiology of the anorectal region was discussed.  The pathophysiology of anal fissure and differential diagnosis was discussed.  Natural history progression  was discussed.   I stressed the importance of a bowel regimen to have daily soft bowel movements to minimize progression of disease.     The patient's condition is not adequately controlled.  Non-operative treatment has not healed the fissure.  Therefore, I recommended examination under anesthesia for better examination to confirm the diagnosis and treat by lateral internal  sphincterotomy to relax the spasm better & allow the fissure to heal.  Technique, benefits, alternatives were discussed.   I noted a good likelihood this will help address the problem.  Risks such as bleeding, pain, incontinence, recurrence, heart attack, death, and other risks were discussed.    Educational handouts further explaining the pathology, treatment options, and bowel regimen were given as well.  The patient expressed understanding & wishes to proceed with surgery.  OR FINDINGS: Patient had a posterior midline chronic anal fissure with a hypertensive thickened sphincter.    Sphincterotomy location:   Left lateral anal canal.  50% distal internal sphincterotomy performed   IDESCRIPTION:   Informed consent was confirmed. Patient underwent general anesthesia without difficulty. Patient was placed into  prone/jacknife positioning.  The perianal region was prepped and draped in sterile fashion. Surgical timeout confirmed or plan.  I did digital rectal examination and then transitioned over to anoscopy to get a sense of the anatomy.  I identified an anal fissure in the posterior midline anal canal.  The sphincter tone was increased.  No stricture.  No abscess located.  No fistula   I went ahead and proceeded with internal sphinterotmy technique.  I placed a 2-0 Vicryl suture on the left lateral distal rectum sick centimeters proximal anal verge and run that and interrupting blocked infection distally.  I then opened up the mucosa of the left lateral anal canal longitudinally.  He had a grade 3 irritated left lateral internal hemorrhoid at this location.  I identified the internal and external sphincters.  Internal sphincter was rather hypertrophic, about 150% the normal size I carefully isolated in and elevated the internal sphincter.  I proceeded with a partial internal  sphincterotomy starting distally and moving proximally using cautery.  This involved the distal 50% thickness.  This provided  improved relaxation of the anal sphincter.  I then continued the 2-0 Vicryl suture to help close the anal canal.  I ended up having to trim the left lateral hemorrhoid to do a partial hemorrhoidectomy and then closed that wound down by continuing the 2-0 Vicryl suture in an interrupted running fashion anal verge to tie that down.  I closed the sphincterotomy wound well providing hemorrhoid ligation and pexy and partial hemorrhoidectomy.  His right anterior hemorrhoid was rather inflamed as well.  Left lateral irritated but more grade 2.  I did hemorrhoidal ligation and pexy in the classic hexagonal pattern doing 2-0 Vicryl sutures in a longitudinal fashion similar to how I had done the left lateral pile.  Did not need to excise any excess tissue.  He had some chronic rectal irritation and inflammation.  Mild proctitis.  No fistula.  No abscess.  I did aggressive block.  The chronic anal fissure wound appeared to be flat and not particularly scarred so I did not aggressively excise it.  Did cauterize on the needle fissure wound, taking care not to injure the sphincters.  Completed an aggressive anorectal block and field block.  His ooziness had calm down markedly.  Did reinspection to confirm no evidence of any arterial nor venous nor hemorrhoidal bleeding.  Placed topical to became ointment fluff and mesh panties.  Patient extubated recovery room.  I discussed operative findings, updated the patient's status, discussed probable steps to recovery, and gave postoperative recommendations to the patient's spouse, John Faulkner  Recommendations were made.  Questions were answered.  She expressed understanding & appreciation.   Ardeth Sportsman, M.D., F.A.C.S. Gastrointestinal and Minimally Invasive Surgery [facility] [address] [place], [place] [address] [phone] Main / Paging

## 2022-02-16 NOTE — Anesthesia Preprocedure Evaluation (Signed)
Anesthesia Evaluation  Patient identified by MRN, date of birth, ID band Patient awake    Reviewed: Allergy & Precautions, NPO status , Patient's Chart, lab work & pertinent test results  Airway Mallampati: II  TM Distance: >3 FB Neck ROM: Full    Dental no notable dental hx.    Pulmonary neg pulmonary ROS,    Pulmonary exam normal        Cardiovascular hypertension,  Rhythm:Regular Rate:Normal     Neuro/Psych Anxiety Depression negative neurological ROS     GI/Hepatic Neg liver ROS, Anal fissure   Endo/Other  negative endocrine ROS  Renal/GU   negative genitourinary   Musculoskeletal negative musculoskeletal ROS (+)   Abdominal Normal abdominal exam  (+)   Peds  Hematology negative hematology ROS (+)   Anesthesia Other Findings   Reproductive/Obstetrics                             Anesthesia Physical Anesthesia Plan  ASA: 2  Anesthesia Plan: General   Post-op Pain Management: Tylenol PO (pre-op)* and Celebrex PO (pre-op)*   Induction: Intravenous  PONV Risk Score and Plan: 2 and Ondansetron, Dexamethasone, Midazolam and Treatment may vary due to age or medical condition  Airway Management Planned: Mask and Oral ETT  Additional Equipment: None  Intra-op Plan:   Post-operative Plan: Extubation in OR  Informed Consent: I have reviewed the patients History and Physical, chart, labs and discussed the procedure including the risks, benefits and alternatives for the proposed anesthesia with the patient or authorized representative who has indicated his/her understanding and acceptance.     Dental advisory given  Plan Discussed with: CRNA  Anesthesia Plan Comments:         Anesthesia Quick Evaluation

## 2022-02-16 NOTE — H&P (Signed)
02/16/2022    REFERRING PHYSICIAN: Lemont Fillers, *  Patient Care Team: Lemont Fillers, NP as PCP - General (Family Medicine) Elease Etienne, MD (Gastroenterology) Karyl Kinnier, MD as Consulting Provider (General Surgery)  PROVIDER: Jarrett Soho, MD  [facility] MRN: [id number] DOB: [birth year]  SUBJECTIVE   Chief Complaint: new problem   History of Present Illness: John Faulkner is a 36 y.o. male who is seen today  as an office consultation at the request of Dr. Peggyann Juba  for evaluation of new problem .   Pleasant gentleman known to our group. Prior cholecystectomy. 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 Patient sounds like he has some occasional hard stools and constipation. Noticed some anal pain last fall. Persistent despite soaks and nonsteroidals. Saw primary care and gastroenterology. Anal fissure suspected. Placed on nitroglycerin cream. That seemed to help somewhat. He does not know if it ever fully went away but it was milder. However last month he started having sharp anal pain again. Tried to use warm soaks and ibuprofen and still struggling. Mention to his primary care physician who called gastrology. They recommended surgical evaluation.  Patient walks several miles difficulty. Does not smoke. No diabetes. No sleep apnea. Aside from the laparoscopically cystectomy and sleeve gastrectomy, he has had no other surgeries. No anorectal issues in the past except for some mild constipation. Denies any severe rectal bleeding. No history of Crohn's or ulcerative colitis.  Medical History:  Past Medical History:  Diagnosis Date  Chronic kidney disease  Hypertension  Sleep apnea   Patient Active Problem List  Diagnosis  Chronic anal fissure  History of sleeve gastrectomy  Constipation   History reviewed. No pertinent surgical history.   No Known Allergies  Current Outpatient  Medications on File Prior to Visit  Medication Sig Dispense Refill  clomiPHENE (CLOMID) 50 mg tablet  dextroamphetamine-amphetamine (ADDERALL) 5 mg tablet  escitalopram oxalate (LEXAPRO) 10 MG tablet Take 10 mg by mouth once daily   No current facility-administered medications on file prior to visit.   History reviewed. No pertinent family history.   Social History   Tobacco Use  Smoking Status Never  Smokeless Tobacco Never    Social History   Socioeconomic History  Marital status: Married  Tobacco Use  Smoking status: Never  Smokeless tobacco: Never  Vaping Use  Vaping Use: Never used  Substance and Sexual Activity  Alcohol use: Not Currently  Drug use: Defer  Sexual activity: Defer   ############################################################  Review of Systems: A complete review of systems (ROS) was obtained from the patient. I have reviewed this information and discussed as appropriate with the patient. See HPI as well for other pertinent ROS.  Constitutional: No fevers, chills, sweats. Weight stable Eyes: No vision changes, No discharge HENT: No sore throats, nasal drainage Lymph: No neck swelling, No bruising easily Pulmonary: No cough, productive sputum CV: No orthopnea, PND . No exertional chest/neck/shoulder/arm pain. Patient can walk 2-3 miles without difficulty.   GI:  No personal nor family history of GI/colon cancer, inflammatory bowel disease, irritable bowel syndrome, allergy such as Celiac Sprue, dietary/dairy problems, colitis, ulcers nor gastritis. No recent sick contacts/gastroenteritis. No travel outside the country. No changes in diet.  Renal: No UTIs, No hematuria Genital: No drainage, bleeding, masses Musculoskeletal: No severe joint pain. Good ROM major joints Skin: No sores or lesions Heme/Lymph: No easy bleeding. No swollen lymph nodes Neuro: No active seizures. No facial  droop Psych: No hallucinations. No agitation  OBJECTIVE    There were no vitals filed for this visit.  There is no height or weight on file to calculate BMI.  PHYSICAL EXAM:  Constitutional: Not cachectic. Hygeine adequate. Vitals signs as above.  Eyes: No glasses. Vision adequate,Pupils reactive, normal extraocular movements. Sclera nonicteric Neuro: CN II-XII intact. No major focal sensory defects. No major motor deficits. Lymph: No head/neck/groin lymphadenopathy Psych: No severe agitation. No severe anxiety. Judgment & insight Adequate, Oriented x4, HENT: Normocephalic, Mucus membranes moist. No thrush. Hearing: adequate Neck: Supple, No tracheal deviation. No obvious thyromegaly Chest: No pain to chest wall compression. Good respiratory excursion. No audible wheezing CV: Pulses intact. regular. No major extremity edema Ext: No obvious deformity or contracture. Edema: Not present. No cyanosis Skin: No major subcutaneous nodules. Warm and dry Musculoskeletal: Severe joint rigidity not present. No obvious clubbing. No digital petechiae. Mobility: no assist device moving easily without restrictions  Abdomen: Flat Soft. Nondistended. Nontender. Hernia: Not present. Diastasis recti: Not present. No hepatomegaly. No splenomegaly.  Genital/Pelvic: Inguinal hernia: Not present. Inguinal lymph nodes: without lymphadenopathy nor hidradenitis.   Rectal:   ##################################  Perianal skin Clean with good hygiene  Pruritis ani: Mild Pilonidal disease: Not present Condyloma / warts: Not present  Anal fissure: Increased finger tone with posterior midline fissure palpated on anal canal. This is a source of pain. Mild sentinel tag.  Perirectal abscess/fistula Not present External hemorrhoids Not present  Digital and anoscopic rectal exam: Not tolerated Sphincter tone Increased   Patient examined with patient in decubitus position .   ###################################    ###################################################################  Labs, Imaging and Diagnostic Testing:  Located in 'Care Everywhere' section of Epic EMR chart  PRIOR [facility] CLINIC NOTES:  Located in 'Care Everywhere' section of Epic EMR chart  SURGERY NOTES:  Located in 'Care Everywhere' section of Epic EMR chart  PATHOLOGY:  Located in 'Care Everywhere' section of Epic EMR chart  Assessment and Plan:  DIAGNOSES:  Diagnoses and all orders for this visit:  Chronic anal fissure  History of sleeve gastrectomy  Constipation, unspecified constipation type    ASSESSMENT/PLAN  Classic story for anal fissure with some mild improvement with nitroglycerin cream with recurrent symptoms. Looks like this has been going on for several months.  The anatomy & physiology of the anorectal region was discussed. The pathophysiology of anal fissure and differential diagnosis was discussed. Natural history progression was discussed. I stressed the importance of a bowel regimen to have daily soft bowel movements to minimize progression of disease. I discussed the use of warm soaks & muscle relaxant, diltiazem, to help the anal sphincter relax, allow the spasming to stop, and help the tear/fissure to heal.  If non-operative treatment does not heal the fissure, I would recommend examination under anesthesia for better examination to confirm the diagnosis and treat by lateral internal sphincterotomy to allow the fissure to heal. Technique, benefits, alternatives discussed. Risks such as bleeding, pain, incontinence, recurrence, heart attack, death, and other risks were discussed.   Educational handouts further explaining the pathology, treatment options, and bowel regimen were given as well.   Symptoms is reasonable to try diltiazem cream as an alternative the nitroglycerin cream but I am skeptical that that will work since he has recurrent symptoms in  partial improvement at best with nitroglycerin cream use for over a month. The anal fissure symptoms have been going on for 6 months. He has some scarring and sentinel tag. I do not think  this will resolve without surgery. I recommended anorectal examination under esthesia with partial internal sphincterotomy. Possibly with some hemorrhoids and excise a sentinel node as well. Outpatient surgery. He is ready to get this done as soon as possible.  Ardeth Sportsman, MD, FACS, MASCRS Esophageal, Gastrointestinal & Colorectal Surgery Robotic and Minimally Invasive Surgery  [facility] [address], [address] [place], [place] [address] [phone] Fax [phone] Main  CONTACT INFORMATION:  Weekday (9AM-5PM): Call [facility] main office at [phone]  Weeknight (5PM-9AM) or Weekend/Holiday: Check [email or web address] (password " [email or web address]") for General Surgery [facility] coverage  (Please, do not use SecureChat as it is not reliable communication to reach operating surgeons for immediate patient care)    02/16/2022

## 2022-02-16 NOTE — Anesthesia Procedure Notes (Signed)
Procedure Name: Intubation Date/Time: 02/16/2022 7:35 AM  Performed by: Clearnce Sorrel, CRNAPre-anesthesia Checklist: Patient identified, Emergency Drugs available, Suction available and Patient being monitored Patient Re-evaluated:Patient Re-evaluated prior to induction Oxygen Delivery Method: Circle System Utilized Preoxygenation: Pre-oxygenation with 100% oxygen Induction Type: IV induction Ventilation: Mask ventilation without difficulty Laryngoscope Size: Mac and 4 Grade View: Grade II Tube type: Oral Tube size: 7.5 mm Number of attempts: 2 Airway Equipment and Method: Stylet and Oral airway Placement Confirmation: ETT inserted through vocal cords under direct vision, positive ETCO2 and breath sounds checked- equal and bilateral Secured at: 24 cm Tube secured with: Tape Dental Injury: Teeth and Oropharynx as per pre-operative assessment

## 2022-02-16 NOTE — Anesthesia Postprocedure Evaluation (Signed)
Anesthesia Post Note  Patient: John Faulkner  Procedure(s) Performed: INTERNAL SPHINCTEROTOMY (Rectum) EXAM UNDER ANESTHESIA WITH HEMORRHOIDECTOMY (Rectum)     Patient location during evaluation: PACU Anesthesia Type: General Level of consciousness: awake and alert Pain management: pain level controlled Vital Signs Assessment: post-procedure vital signs reviewed and stable Respiratory status: spontaneous breathing, nonlabored ventilation, respiratory function stable and patient connected to nasal cannula oxygen Cardiovascular status: blood pressure returned to baseline and stable Postop Assessment: no apparent nausea or vomiting Anesthetic complications: no   No notable events documented.  Last Vitals:  Vitals:   02/16/22 0845 02/16/22 0900  BP: 138/81 138/88  Pulse: (!) 51 (!) 48  Resp: 10 12  Temp:  36.6 C  SpO2: 100% 100%    Last Pain:  Vitals:   02/16/22 0945  TempSrc:   PainSc: 0-No pain                 Earl Lites P Watt Geiler

## 2022-02-16 NOTE — Transfer of Care (Signed)
Immediate Anesthesia Transfer of Care Note  Patient: John Faulkner  Procedure(s) Performed: INTERNAL SPHINCTEROTOMY (Rectum) EXAM UNDER ANESTHESIA WITH HEMORRHOIDECTOMY (Rectum)  Patient Location: PACU  Anesthesia Type:General  Level of Consciousness: drowsy  Airway & Oxygen Therapy: Patient Spontanous Breathing  Post-op Assessment: Report given to RN and Post -op Vital signs reviewed and stable  Post vital signs: Reviewed and stable  Last Vitals:  Vitals Value Taken Time  BP 145/79 02/16/22 0836  Temp    Pulse 64 02/16/22 0837  Resp 15 02/16/22 0837  SpO2 100 % 02/16/22 0837  Vitals shown include unvalidated device data.  Last Pain:  Vitals:   02/16/22 0605  TempSrc: Oral      Patients Stated Pain Goal: 4 (02/16/22 0606)  Complications: No notable events documented.

## 2022-02-16 NOTE — Discharge Instructions (Addendum)
##############################################  ANORECTAL SURGERY:  POST OPERATIVE INSTRUCTIONS  ######################################################################  EAT Start with a pureed / full liquid diet After 24 hours, gradually transition to a high fiber diet.    CONTROL PAIN Control pain so you can tolerate bowel movements,  walk, sleep, tolerate sneezing/coughing, and go up/down stairs.   HAVE A BOWEL MOVEMENT DAILY Keep your bowels regular to avoid problems.   Taking a fiber supplement every day to keep bowels soft.   Try a laxative to override constipation. Use an antidairrheal to slow down diarrhea.   Call if not better after 2 tries  WALK Walk an hour a day.  Control your pain to do that.   CALL IF YOU HAVE PROBLEMS/CONCERNS Call if you are still struggling despite following these instructions. Call if you have concerns not answered by these instructions  ######################################################################    Take your usually prescribed home medications unless otherwise directed.  DIET: Follow a light bland diet & liquids the first 24 hours after arrival home, such as soup, liquids, starches, etc.  Be sure to drink plenty of fluids.  Quickly advance to a usual solid diet within a few days.  Avoid fast food or heavy meals as your are more likely to get nauseated or have irregular bowels.  A low-fat, high-fiber diet for the rest of your life is ideal.  PAIN CONTROL: Expect swelling and discomfort in the anus/rectal area. Pain is best controlled by a usual combination of many methods TOGETHER: Warm baths/soaks or Ice packs Over the counter pain medication Prescription pain medications Topical creams    Warm water baths or ice packs (30-60 minutes up to 8 times a day, especially after bowel meovements) will help. Use ice for the first few days to help decrease swelling and bruising, then switch to heat such as warm towels, sitz baths, warm  baths, warm showers, etc to help relax tight/sore spots and speed recovery.  Some people prefer to use ice alone, heat alone, alternating between ice & heat.  Experiment to what works for you.    It is helpful to take an over-the-counter pain medication continuously for the first few weeks.  Choose one of the following that works best for you: Naproxen (Aleve, etc)  Two 252m tabs twice a day Ibuprofen (Advil, etc) Three 2070mtabs four times a day (every meal & bedtime) Acetaminophen (Tylenol, etc) 500-65044mour times a day (every meal & bedtime)  A  prescription for pain medication (such as oxycodone, hydrocodone, etc) should be given to you upon discharge.  Take your pain medication as prescribed.  If you are having problems/concerns with the prescription medicine (does not control pain, nausea, vomiting, rash, itching, etc), please call us [phone] see if we need to switch you to a different pain medicine that will work better for you and/or control your side effect better. If you need a refill on your pain medication, please contact your pharmacy.  They will contact our office to request authorization. Prescriptions will not be filled after 5 pm or on week-ends.  If can take up to 48 hours for it to be filled & ready so avoid waiting until you are down to thel ast pill.  A topical cream (Dibucaine) or a prescription for a cream (such as diltiazem 2% gel) may be given to you.  Many people find relief with topical creams.  Some people find it burns too much.  Experiment.  If it helps, use it.  If it burns, don't  using it.  You also may receive a prescription for diazepam, a muscle relaxant to help you to be able to urinate and defecate more easily.  It is safe to take a few doses with the other medications as long as you are not planning to drive or do anything intense.  Hopefully this can minimize the chance of needing a Foley catheter into your bladder     KEEP YOUR BOWELS  REGULAR The goal is one soft bowel movement a day Avoid getting constipated.  Between the surgery and the pain medications, it is common to experience some constipation.  Increasing fluid intake and taking a fiber supplement (such as Metamucil, Citrucel, FiberCon, MiraLax, etc) 2-4 times a day regularly will usually help prevent this problem from occurring.  A mild laxative (prune juice, Milk of Magnesia, MiraLax, etc) should be taken according to package directions if there are no bowel movements after 48 hours. Watch out for diarrhea.  If you have many loose bowel movements, simplify your diet to bland foods & liquids for a few days.  Stop any stool softeners and decrease your fiber supplement.  Switching to mild anti-diarrheal medications (Kayopectate, Pepto Bismol) can help.  Can try an imodium/loperamide dose.  If this worsens or does not improve, please call us.  Wound Care   a. You have some fluffed gauze on top of the anus to help catch drainage and bleeding.  Let the gauze fall off with the first bowel movement or shower.  It is okay to reinforce or replace as needed.  Bleeding is common at first and occasionally tapers off   b. Place soft cotton balls on the anus/wounds and use an absorbent pad in your underwear as needed to catch any drainage and help keep the area.  Try to use cotton balls or pads over regular gauze or toilet paper as gauze will stick and pull, causing pain.  Cotton will come off more easily.   c. Keep the area clean and dry.  Bathe / shower every day.  Keep the area clean by showering / bathing over the incision / wound.   It is okay to soak an open wound to help wash it.  Consider using a squeeze bottle filled with warm water to gently wash the anal area.  Wet wipes or showers / gentle washing after bowel movements is often less traumatic than regular toilet paper.  Use a Sitz Bath 4-8 times a day for relief  A sitz bath is a warm water bath taken in the sitting position  that covers only the hips and buttocks. It may be used for either healing or hygiene purposes. Sitz baths are also used to relieve pain, itching, or muscle spasms.  Gently cleaned the area and the heat will help lower spasm and offer better pain control.    Fill the bathtub half full with warm water. Sit in the water and open the drain a little. Turn on the warm water to keep the tub half full. Keep the water running constantly. Soak in the water for 15 to 20 minutes. After the sitz bath, pat the affected area dry first.   d. You will often notice bleeding, especially with bowel movements.  This should slow down by the end of the first week of surgery.  Sitting on an ice pack can help.   e. Expect some drainage.  You often will have some blood or yellow drainage with open wounds.  Sometimes she will get a little  leaking of liquid stool until the incision/wounds have fully close down.  This should slow down by the end of the first week of surgery, but you will have occasional bleeding or drainage up to a few months after surgery.  Wear an absorbent pad or soft cotton gauze in your underwear until the drainage stops.  ACTIVITIES as tolerated:    You may resume regular (light) daily activities beginning the next day--such as daily self-care, walking, climbing stairs--gradually increasing activities as tolerated.  If you can walk 30 minutes without difficulty, it is safe to try more intense activity such as jogging, treadmill, bicycling, low-impact aerobics, swimming, etc. Save the most intensive and strenuous activity for last such as sit-ups, heavy lifting, contact sports, etc  Refrain from any heavy lifting or straining until you are off narcotics for pain control.   DO NOT PUSH THROUGH PAIN.  Let pain be your guide: If it hurts to do something, don't do it.  Pain is your body warning you to avoid that activity for another week until the pain goes down. You may drive when you are no longer taking  prescription pain medication, you can comfortably sit for long periods of time, and you can safely maneuver your car and apply brakes. You may have sexual intercourse when it is comfortable.   FOLLOW UP in our office Please call [facility] at [phone] to set up an appointment to see your surgeon in the office for a follow-up appointment approximately 3 weeks after your surgery. Make sure that you call for this appointment the day you arrive home to ensure a convenient appointment time.  8. IF YOU HAVE DISABILITY OR FAMILY LEAVE FORMS, BRING THEM TO THE OFFICE FOR PROCESSING.  DO NOT GIVE THEM TO YOUR DOCTOR.        WHEN TO CALL us [phone]: Poor pain control Reactions / problems with new medications (rash/itching, nausea, etc)  Fever over 101.5 F (38.5 C) Inability to urinate Nausea and/or vomiting Worsening swelling or bruising Continued bleeding from incision. Increased pain, redness, or drainage from the incision  The clinic staff is available to answer your questions during regular business hours (8:30am-5pm).  Please don't hesitate to call and ask to speak to one of our nurses for clinical concerns.   A surgeon from [facility] is always on call at the hospitals   If you have a medical emergency, go to the nearest emergency room or call 911.    [facility] [address], [place], [place]  [address] ? MAIN: [phone] ? TOLL FREE: [phone] ? FAX [phone] [email or web address]  #####################################################        Post Anesthesia Home Care Instructions  Activity: Get plenty of rest for the remainder of the day. A responsible individual must stay with you for 24 hours following the procedure.  For the next 24 hours, DO NOT: -Drive a car -Advertising copywriter -Drink alcoholic beverages -Take any medication unless instructed by your physician -Make any legal decisions  or sign important papers.  Meals: Start with liquid foods such as gelatin or soup. Progress to regular foods as tolerated. Avoid greasy, spicy, heavy foods. If nausea and/or vomiting occur, drink only clear liquids until the nausea and/or vomiting subsides. Call your physician if vomiting continues.  Special Instructions/Symptoms: Your throat may feel dry or sore from the anesthesia or the breathing tube placed in your throat during surgery. If this causes discomfort, gargle with warm salt water.  The discomfort should disappear within 24 hours.        Information for Discharge Teaching: EXPAREL (bupivacaine liposome injectable suspension)   Your surgeon or anesthesiologist gave you EXPAREL(bupivacaine) to help control your pain after surgery.  EXPAREL is a local anesthetic that provides pain relief by numbing the tissue around the surgical site. EXPAREL is designed to release pain medication over time and can control pain for up to 72 hours. Depending on how you respond to EXPAREL, you may require less pain medication during your recovery.  Possible side effects: Temporary loss of sensation or ability to move in the area where bupivacaine was injected. Nausea, vomiting, constipation Rarely, numbness and tingling in your mouth or lips, lightheadedness, or anxiety may occur. Call your doctor right away if you think you may be experiencing any of these sensations, or if you have other questions regarding possible side effects.  Follow all other discharge instructions given to you by your surgeon or nurse. Eat a healthy diet and drink plenty of water or other fluids.  If you return to the hospital for any reason within 96 hours following the administration of EXPAREL, it is important for health care providers to know that you have received this anesthetic. A teal colored band has been placed on your arm with the date, time and amount of EXPAREL you have received in order to alert and inform  your health care providers. Please leave this armband in place for the full 96 hours following administration, and then you may remove the band.      No ibuprofen, Advil, Aleve, Motrin, ketorolac, meloxicam, naproxen, or other NSAIDS until after 12:15 pm today if needed.   No tylenol products until after 12:15 pm today if needed.

## 2022-02-19 ENCOUNTER — Other Ambulatory Visit: Payer: Self-pay | Admitting: Family

## 2022-02-19 ENCOUNTER — Encounter (HOSPITAL_BASED_OUTPATIENT_CLINIC_OR_DEPARTMENT_OTHER): Payer: Self-pay | Admitting: Surgery

## 2022-02-19 LAB — SURGICAL PATHOLOGY

## 2022-02-19 NOTE — Telephone Encounter (Signed)
Requesting: Adderall 5mg   Contract: 01/29/22 UDS: 01/29/22 Last Visit: 01/29/22 Next Visit: 07/30/22 Last Refill: 01/17/22 #30 and 0RF  Please Advise

## 2022-02-20 MED ORDER — AMPHETAMINE-DEXTROAMPHETAMINE 5 MG PO TABS: ORAL_TABLET | ORAL | 0 refills | Status: DC

## 2022-02-22 ENCOUNTER — Other Ambulatory Visit (HOSPITAL_BASED_OUTPATIENT_CLINIC_OR_DEPARTMENT_OTHER): Payer: Self-pay

## 2022-02-23 ENCOUNTER — Other Ambulatory Visit (HOSPITAL_BASED_OUTPATIENT_CLINIC_OR_DEPARTMENT_OTHER): Payer: Self-pay

## 2022-02-23 HISTORY — PX: ABDOMINOPLASTY: SUR9

## 2022-02-27 ENCOUNTER — Other Ambulatory Visit (HOSPITAL_BASED_OUTPATIENT_CLINIC_OR_DEPARTMENT_OTHER): Payer: Self-pay

## 2022-03-01 ENCOUNTER — Other Ambulatory Visit (HOSPITAL_BASED_OUTPATIENT_CLINIC_OR_DEPARTMENT_OTHER): Payer: Self-pay

## 2022-03-01 ENCOUNTER — Other Ambulatory Visit (HOSPITAL_COMMUNITY): Payer: Self-pay

## 2022-03-01 ENCOUNTER — Encounter: Payer: Self-pay | Admitting: Family

## 2022-03-01 MED ORDER — AMPHETAMINE-DEXTROAMPHET ER 10 MG PO CP24
10.0000 mg | ORAL_CAPSULE | Freq: Every day | ORAL | 0 refills | Status: DC
  Filled 2022-03-01: qty 30, 30d supply, fill #0

## 2022-03-01 NOTE — Telephone Encounter (Signed)
Please cancel adderall xr 10mg  rx at [facility]. tks

## 2022-03-23 ENCOUNTER — Encounter: Payer: Self-pay | Admitting: Family

## 2022-03-23 ENCOUNTER — Other Ambulatory Visit (HOSPITAL_COMMUNITY): Payer: Self-pay

## 2022-03-23 MED ORDER — AMPHETAMINE-DEXTROAMPHETAMINE 5 MG PO TABS
5.0000 mg | ORAL_TABLET | Freq: Every day | ORAL | 0 refills | Status: DC
  Filled 2022-03-23: qty 30, 30d supply, fill #0

## 2022-03-30 ENCOUNTER — Other Ambulatory Visit: Payer: Self-pay | Admitting: Family

## 2022-03-30 ENCOUNTER — Other Ambulatory Visit (HOSPITAL_COMMUNITY): Payer: Self-pay

## 2022-03-30 MED ORDER — AMPHETAMINE-DEXTROAMPHET ER 10 MG PO CP24
10.0000 mg | ORAL_CAPSULE | Freq: Every day | ORAL | 0 refills | Status: DC
  Filled 2022-03-30: qty 30, 30d supply, fill #0

## 2022-04-25 ENCOUNTER — Other Ambulatory Visit: Payer: Self-pay | Admitting: Family

## 2022-04-25 ENCOUNTER — Other Ambulatory Visit (HOSPITAL_COMMUNITY): Payer: Self-pay

## 2022-04-25 MED ORDER — AMPHETAMINE-DEXTROAMPHETAMINE 5 MG PO TABS
5.0000 mg | ORAL_TABLET | Freq: Every day | ORAL | 0 refills | Status: DC
  Filled 2022-04-25 – 2022-04-27 (×2): qty 30, 30d supply, fill #0

## 2022-04-26 ENCOUNTER — Other Ambulatory Visit (HOSPITAL_COMMUNITY): Payer: Self-pay

## 2022-04-27 ENCOUNTER — Other Ambulatory Visit (HOSPITAL_COMMUNITY): Payer: Self-pay

## 2022-05-03 ENCOUNTER — Other Ambulatory Visit (HOSPITAL_COMMUNITY): Payer: Self-pay

## 2022-05-03 ENCOUNTER — Other Ambulatory Visit: Payer: Self-pay | Admitting: Family

## 2022-05-03 DIAGNOSIS — E291 Testicular hypofunction: Secondary | ICD-10-CM | POA: Diagnosis not present

## 2022-05-03 DIAGNOSIS — Z125 Encounter for screening for malignant neoplasm of prostate: Secondary | ICD-10-CM | POA: Diagnosis not present

## 2022-05-03 DIAGNOSIS — R5383 Other fatigue: Secondary | ICD-10-CM | POA: Diagnosis not present

## 2022-05-03 MED ORDER — AMPHETAMINE-DEXTROAMPHET ER 10 MG PO CP24
10.0000 mg | ORAL_CAPSULE | Freq: Every day | ORAL | 0 refills | Status: DC
  Filled 2022-05-03: qty 30, 30d supply, fill #0

## 2022-05-07 ENCOUNTER — Other Ambulatory Visit (HOSPITAL_COMMUNITY): Payer: Self-pay

## 2022-05-08 ENCOUNTER — Other Ambulatory Visit (HOSPITAL_COMMUNITY): Payer: Self-pay

## 2022-05-09 DIAGNOSIS — Z6825 Body mass index (BMI) 25.0-25.9, adult: Secondary | ICD-10-CM | POA: Diagnosis not present

## 2022-05-09 DIAGNOSIS — R6882 Decreased libido: Secondary | ICD-10-CM | POA: Diagnosis not present

## 2022-05-09 DIAGNOSIS — E291 Testicular hypofunction: Secondary | ICD-10-CM | POA: Diagnosis not present

## 2022-05-09 DIAGNOSIS — N529 Male erectile dysfunction, unspecified: Secondary | ICD-10-CM | POA: Diagnosis not present

## 2022-05-22 ENCOUNTER — Ambulatory Visit: Payer: Self-pay | Admitting: Licensed Clinical Social Worker

## 2022-05-22 NOTE — Patient Outreach (Signed)
  Care Coordination   05/22/2022 Name: John Faulkner MRN: [id number] DOB: 20-Aug-1985   Care Coordination Outreach Attempts:  An unsuccessful telephone outreach was attempted today to offer the patient information about available care coordination services as a benefit of their health plan.   Follow Up Plan:  Additional outreach attempts will be made to offer the patient care coordination information and services.   Encounter Outcome:  No Answer   Care Coordination Interventions:  No, not indicated    Kelton Pillar.Zeno Hickel MSW, LCSW Licensed Visual merchandiser [facility] Care Management [phone]

## 2022-05-25 ENCOUNTER — Encounter (HOSPITAL_COMMUNITY): Payer: Self-pay | Admitting: *Deleted

## 2022-06-05 ENCOUNTER — Other Ambulatory Visit: Payer: Self-pay | Admitting: Family

## 2022-06-05 NOTE — Telephone Encounter (Signed)
Requesting: Adderall XR 10mg  and Adderall 5mg   Contract: 01/29/22 UDS: 01/29/22 Last Visit: 01/29/22 Next Visit: 07/30/22 Last Refill on 05/03/22 #30 and 0RF   Please Advise

## 2022-06-06 ENCOUNTER — Other Ambulatory Visit (HOSPITAL_COMMUNITY): Payer: Self-pay

## 2022-06-06 MED ORDER — AMPHETAMINE-DEXTROAMPHET ER 10 MG PO CP24
10.0000 mg | ORAL_CAPSULE | Freq: Every day | ORAL | 0 refills | Status: DC
  Filled 2022-06-06: qty 30, 30d supply, fill #0

## 2022-06-06 MED ORDER — AMPHETAMINE-DEXTROAMPHETAMINE 5 MG PO TABS
5.0000 mg | ORAL_TABLET | Freq: Every day | ORAL | 0 refills | Status: DC
  Filled 2022-06-06: qty 30, 30d supply, fill #0

## 2022-06-07 ENCOUNTER — Other Ambulatory Visit (HOSPITAL_COMMUNITY): Payer: Self-pay

## 2022-06-08 DIAGNOSIS — Z903 Acquired absence of stomach [part of]: Secondary | ICD-10-CM | POA: Diagnosis not present

## 2022-06-26 ENCOUNTER — Encounter: Payer: Self-pay | Admitting: Licensed Clinical Social Worker

## 2022-07-08 ENCOUNTER — Other Ambulatory Visit: Payer: Self-pay | Admitting: Family

## 2022-07-08 MED ORDER — AMPHETAMINE-DEXTROAMPHETAMINE 5 MG PO TABS
5.0000 mg | ORAL_TABLET | Freq: Every day | ORAL | 0 refills | Status: DC
  Filled 2022-07-08: qty 30, 30d supply, fill #0

## 2022-07-08 MED ORDER — AMPHETAMINE-DEXTROAMPHET ER 10 MG PO CP24
10.0000 mg | ORAL_CAPSULE | Freq: Every day | ORAL | 0 refills | Status: DC
  Filled 2022-07-08: qty 30, 30d supply, fill #0

## 2022-07-09 ENCOUNTER — Other Ambulatory Visit (HOSPITAL_BASED_OUTPATIENT_CLINIC_OR_DEPARTMENT_OTHER): Payer: Self-pay

## 2022-07-13 DIAGNOSIS — F330 Major depressive disorder, recurrent, mild: Secondary | ICD-10-CM | POA: Diagnosis not present

## 2022-07-13 DIAGNOSIS — Z630 Problems in relationship with spouse or partner: Secondary | ICD-10-CM | POA: Diagnosis not present

## 2022-07-20 DIAGNOSIS — F330 Major depressive disorder, recurrent, mild: Secondary | ICD-10-CM | POA: Diagnosis not present

## 2022-07-20 DIAGNOSIS — Z630 Problems in relationship with spouse or partner: Secondary | ICD-10-CM | POA: Diagnosis not present

## 2022-07-23 ENCOUNTER — Other Ambulatory Visit: Payer: Self-pay | Admitting: Family

## 2022-07-27 DIAGNOSIS — Z630 Problems in relationship with spouse or partner: Secondary | ICD-10-CM | POA: Diagnosis not present

## 2022-07-27 DIAGNOSIS — F330 Major depressive disorder, recurrent, mild: Secondary | ICD-10-CM | POA: Diagnosis not present

## 2022-07-30 ENCOUNTER — Encounter: Payer: BC Managed Care – PPO | Admitting: Family

## 2022-08-03 DIAGNOSIS — F330 Major depressive disorder, recurrent, mild: Secondary | ICD-10-CM | POA: Diagnosis not present

## 2022-08-03 DIAGNOSIS — Z630 Problems in relationship with spouse or partner: Secondary | ICD-10-CM | POA: Diagnosis not present

## 2022-08-06 ENCOUNTER — Other Ambulatory Visit (HOSPITAL_BASED_OUTPATIENT_CLINIC_OR_DEPARTMENT_OTHER): Payer: Self-pay

## 2022-08-06 ENCOUNTER — Encounter: Payer: Self-pay | Admitting: Family

## 2022-08-06 ENCOUNTER — Ambulatory Visit (INDEPENDENT_AMBULATORY_CARE_PROVIDER_SITE_OTHER): Payer: BC Managed Care – PPO | Admitting: Family

## 2022-08-06 VITALS — BP 136/69 | HR 64 | Temp 98.2°F | Resp 16 | Ht 72.0 in | Wt 190.0 lb

## 2022-08-06 DIAGNOSIS — Z0000 Encounter for general adult medical examination without abnormal findings: Secondary | ICD-10-CM | POA: Diagnosis not present

## 2022-08-06 DIAGNOSIS — Z23 Encounter for immunization: Secondary | ICD-10-CM | POA: Diagnosis not present

## 2022-08-06 DIAGNOSIS — F909 Attention-deficit hyperactivity disorder, unspecified type: Secondary | ICD-10-CM

## 2022-08-06 DIAGNOSIS — F418 Other specified anxiety disorders: Secondary | ICD-10-CM | POA: Diagnosis not present

## 2022-08-06 DIAGNOSIS — E291 Testicular hypofunction: Secondary | ICD-10-CM | POA: Diagnosis not present

## 2022-08-06 LAB — COMPREHENSIVE METABOLIC PANEL
ALT: 14 U/L (ref 0–53)
AST: 19 U/L (ref 0–37)
Albumin: 4.6 g/dL (ref 3.5–5.2)
Alkaline Phosphatase: 61 U/L (ref 39–117)
BUN: 14 mg/dL (ref 6–23)
CO2: 28 mEq/L (ref 19–32)
Calcium: 9.9 mg/dL (ref 8.4–10.5)
Chloride: 104 mEq/L (ref 96–112)
Creatinine, Ser: 1 mg/dL (ref 0.40–1.50)
GFR: 96.78 mL/min (ref >60.00)
Glucose, Bld: 81 mg/dL (ref 70–99)
Potassium: 5.1 mEq/L (ref 3.5–5.1)
Sodium: 140 mEq/L (ref 135–145)
Total Bilirubin: 0.6 mg/dL (ref 0.2–1.2)
Total Protein: 6.8 g/dL (ref 6.0–8.3)

## 2022-08-06 LAB — CBC WITH DIFFERENTIAL/PLATELET
Basophils Absolute: 0.1 K/uL (ref 0.0–0.1)
Basophils Relative: 1 % (ref 0.0–3.0)
Eosinophils Absolute: 0.1 K/uL (ref 0.0–0.7)
Eosinophils Relative: 1.3 % (ref 0.0–5.0)
HCT: 47.4 % (ref 39.0–52.0)
Hemoglobin: 15.8 g/dL (ref 13.0–17.0)
Lymphocytes Relative: 26.5 % (ref 12.0–46.0)
Lymphs Abs: 1.5 K/uL (ref 0.7–4.0)
MCHC: 33.4 g/dL (ref 30.0–36.0)
MCV: 91.5 fl (ref 78.0–100.0)
Monocytes Absolute: 0.6 K/uL (ref 0.1–1.0)
Monocytes Relative: 10.6 % (ref 3.0–12.0)
Neutro Abs: 3.4 K/uL (ref 1.4–7.7)
Neutrophils Relative %: 60.6 % (ref 43.0–77.0)
Platelets: 155 K/uL (ref 150.0–400.0)
RBC: 5.18 Mil/uL (ref 4.22–5.81)
RDW: 13.9 % (ref 11.5–15.5)
WBC: 5.6 K/uL (ref 4.0–10.5)

## 2022-08-06 LAB — LIPID PANEL
Cholesterol: 159 mg/dL (ref 0–200)
HDL: 50 mg/dL (ref >39.00)
LDL Cholesterol: 96 mg/dL (ref 0–99)
NonHDL: 108.98
Total CHOL/HDL Ratio: 3
Triglycerides: 65 mg/dL (ref 0.0–149.0)
VLDL: 13 mg/dL (ref 0.0–40.0)

## 2022-08-06 LAB — TSH: TSH: 1.08 u[IU]/mL (ref 0.35–5.50)

## 2022-08-06 MED ORDER — AMPHETAMINE-DEXTROAMPHET ER 10 MG PO CP24
10.0000 mg | ORAL_CAPSULE | Freq: Every day | ORAL | 0 refills | Status: DC
  Filled 2022-08-06 – 2022-08-10 (×2): qty 30, 30d supply, fill #0

## 2022-08-06 MED ORDER — AMPHETAMINE-DEXTROAMPHETAMINE 5 MG PO TABS
5.0000 mg | ORAL_TABLET | Freq: Every day | ORAL | 0 refills | Status: DC
  Filled 2022-08-06 – 2022-08-10 (×2): qty 30, 30d supply, fill #0

## 2022-08-06 NOTE — Assessment & Plan Note (Signed)
This is being managed by [facility].

## 2022-08-06 NOTE — Patient Instructions (Signed)
Please schedule an eye exam

## 2022-08-06 NOTE — Assessment & Plan Note (Addendum)
Uncontrolled. Notes increased stress in his marriage as he disclosed some hurtful information from his past to his wife. He is doing some counseling and feels that his PHQ-9 score is elevated for this reason. Will continue lexapro 69m once daily and he will let me know if mood worsens or fails to improve in the coming months.

## 2022-08-06 NOTE — Assessment & Plan Note (Signed)
>>  ASSESSMENT AND PLAN FOR DEPRESSION WITH ANXIETY WRITTEN ON 08/06/2022  9:50 AM BY O'SULLIVAN, MELISSA, NP  Uncontrolled. Notes increased stress in his marriage as he disclosed some hurtful information from his past to his wife. He is doing some counseling and feels that his PHQ-9 score is elevated for this reason. Will continue lexapro  10mg  once daily and he will let me know if mood worsens or fails to improve in the coming months.

## 2022-08-06 NOTE — Assessment & Plan Note (Signed)
Stable on adderall.  Continue current regimen/dosing.

## 2022-08-06 NOTE — Assessment & Plan Note (Signed)
Continue healthy diet and regular exercise.  Td today.

## 2022-08-06 NOTE — Progress Notes (Signed)
Subjective:     Patient ID: John Faulkner, male    DOB: 1985/08/09, 37 y.o.   MRN: [id number]  No chief complaint on file.   HPI  Patient presents today for complete physical.  Immunizations: Td is due this year. Had flu shot. Diet: diet is healthy, trying ot cut back on sodas, he watches his caloric intake.  Exercise: does an app at home.  Does "Zing" app 3 x a week.  Vision due Dental: up to date Wt Readings from Last 3 Encounters:  08/06/22 190 lb (86.2 kg)  02/16/22 190 lb 6.4 oz (86.4 kg)  01/29/22 195 lb (88.5 kg)   ADHD- maintained on adderall. Contract up to date.  Focus is "perfect."  Depression/Anxiety- maintained on lexapro 39m.   Hypogonadism- maintained on clomid per [facility] maintained on clomid and arimidex.   Health Maintenance Due  Topic Date Due   Hepatitis C Screening  Never done   COVID-19 Vaccine (4 - 2023-24 season) 02/23/2022    Past Medical History:  Diagnosis Date   Acute calculous cholecystitis 08/13/2011   ADHD (attention deficit hyperactivity disorder)    takes aderall .02/05/2022   Anal fissure    Anxiety    Depression    History of hypertension    none since weight loss 2 yrs ago per pt on 02-06-2022   History of sleep apnea    none since weight loss 1 and 1/2 yrs ago   Hypogonadism in male    Nephrotic syndrome 89  age [age]yrs old resolved    Past Surgical History:  Procedure Laterality Date   abdominoplasty     ABDOMINOPLASTY  02/2022   CARPAL TUNNEL RELEASE Left 2021   CHOLECYSTECTOMY  08/13/2011   Procedure: LAPAROSCOPIC CHOLECYSTECTOMY WITH INTRAOPERATIVE CHOLANGIOGRAM;  Surgeon: BJudieth Keens DO;  [facility]  Service: General;  Laterality: N/A;      circumferential abdominoplasty     Sept 2023   EVALUATION UNDER ANESTHESIA WITH HEMORRHOIDECTOMY N/A 02/16/2022   Procedure: EXAM UNDER ANESTHESIA WITH HEMORRHOIDECTOMY;  Surgeon: GMichael Boston MD;  [facility]  Service: General;   Laterality: N/A;   [place]/A 10/10/2020   Procedure: LAPAROSCOPIC GASTRIC SLEEVE RESECTION;  Surgeon: CClovis Riley MD;  [facility];  Service: General;  Laterality: N/A;   MANDIBLE OSTEOTOMY N/A 06/10/2021   Procedure: MANDIBULAR OSTEOTOMY;  Surgeon: SGardner Candle DMD;  [facility]  Service: Oral Surgery;  Laterality: N/A;   MAXILLA OSTEOTOMY N/A 06/10/2021   Procedure: LEFORT 1 MAXILLARY OSTEOTOMY;  Surgeon: SGardner Candle DMD;  [facility]  Service: Oral Surgery;  Laterality: N/A;   SPHINCTEROTOMY N/A 02/16/2022   Procedure: INTERNAL SPHINCTEROTOMY;  Surgeon: GMichael Boston MD;  [facility]  Service: General;  Laterality: N/A;   TONSILLECTOMY  06/26/1999   UPPER GI ENDOSCOPY N/A 10/10/2020   Procedure: UPPER GI ENDOSCOPY;  Surgeon: CClovis Riley MD;  [facility];  Service: General;  Laterality: N/A;    Family History  Problem Relation Age of Onset   Pneumonia Mother        covid 115pneumonia   COPD Father    Diabetes Father    Heart disease Father 595  Stroke Father    Colon polyps Father        in his late 575s  Diabetes Maternal Grandfather    Diabetes Paternal Grandfather    Colon cancer  Neg Hx    Stomach cancer Neg Hx    Esophageal cancer Neg Hx    Pancreatic cancer Neg Hx     Social History   Socioeconomic History   Marital status: Married    Spouse name: Not on file   Number of children: Not on file   Years of education: Not on file   Highest education level: Not on file  Occupational History   Not on file  Tobacco Use   Smoking status: Never   Smokeless tobacco: Never  Vaping Use   Vaping Use: Never used  Substance and Sexual Activity   Alcohol use: No   Drug use: No   Sexual activity: Not on file  Other Topics Concern   Not on file  Social History Narrative   Work or School:       Home Situation: lives with wife, 2 children   Daughter 2014   Son 2018       Has a cat a Magazine features editor      Spiritual Beliefs: none      Lifestyle: no regular exercise; poor      Works for a Academic librarian      Social Determinants of Radio broadcast assistant Strain: Not on file  Food Insecurity: Not on file  Transportation Needs: Not on file  Physical Activity: Not on file  Stress: Not on file  Social Connections: Not on file  Intimate Partner Violence: Not on file    Outpatient Medications Prior to Visit  Medication Sig Dispense Refill   anastrozole (ARIMIDEX) 1 MG tablet Take 1 mg by mouth every Monday.     clomiPHENE (CLOMID) 50 MG tablet Take 0.5 tablets (25 mg total) by mouth daily.     escitalopram (LEXAPRO) 10 MG tablet TAKE 1 TABLET DAILY 90 tablet 1   OVER THE COUNTER MEDICATION Take 1 capsule by mouth 2 (two) times a week. DIM Tuesday and saturday     amphetamine-dextroamphetamine (ADDERALL XR) 10 MG 24 hr capsule Take 1 capsule (10 mg total) by mouth daily. 30 capsule 0   amphetamine-dextroamphetamine (ADDERALL) 5 MG tablet Take 1 tablet (5 mg total) by mouth daily in the afternoon. 30 tablet 0   DHEA 50 MG CAPS Take 50 mg by mouth 2 (two) times a week. Tuesday and saturday     diazepam (VALIUM) 5 MG tablet Take 1 tablet (5 mg total) by mouth every 8 (eight) hours as needed for muscle spasms (difficulty urinating). 6 tablet 1   oxyCODONE (OXY IR/ROXICODONE) 5 MG immediate release tablet Take 1 tablet (5 mg total) by mouth every 6 (six) hours as needed for severe pain or breakthrough pain. 30 tablet 0   vitamin B-12 (CYANOCOBALAMIN) 500 MCG tablet Take 1 tablet (500 mcg total) by mouth daily.     No facility-administered medications prior to visit.    No Known Allergies  Review of Systems  Constitutional: Negative.   HENT:  Negative for hearing loss.   Eyes:  Negative for blurred vision.  Respiratory:  Negative for cough.   Gastrointestinal:  Negative for constipation and diarrhea.  Genitourinary:  Negative for dysuria and  frequency.  Musculoskeletal:  Negative for joint pain and myalgias.  Neurological:  Negative for headaches.  Psychiatric/Behavioral:  Negative for depression. The patient is not nervous/anxious.        Objective:    Physical Exam  BP 136/69   Pulse 64   Temp 98.2 F (36.8 C) (Oral)  Resp 16   Ht 6' (1.829 m)   Wt 190 lb (86.2 kg)   SpO2 100%   BMI 25.77 kg/m  Wt Readings from Last 3 Encounters:  08/06/22 190 lb (86.2 kg)  02/16/22 190 lb 6.4 oz (86.4 kg)  01/29/22 195 lb (88.5 kg)   Physical Exam  Constitutional: He is oriented to person, place, and time. He appears well-developed and well-nourished. No distress.  HENT:  Head: Normocephalic and atraumatic.  Right Ear: Tympanic membrane and ear canal normal.  Left Ear: Tympanic membrane and ear canal normal.  Mouth/Throat: Oropharynx is clear and moist.  Eyes: Pupils are equal, round, and reactive to light. No scleral icterus.  Neck: Normal range of motion. No thyromegaly present.  Cardiovascular: Normal rate and regular rhythm.   No murmur heard. Pulmonary/Chest: Effort normal and breath sounds normal. No respiratory distress. He has no wheezes. He has no rales. He exhibits no tenderness.  Abdominal: scar is noted on lower abdomen and wraps around lower back- healing well. Musculoskeletal: He exhibits no edema.  Lymphadenopathy:    He has no cervical adenopathy.  Neurological: He is alert and oriented to person, place, and time. He has normal patellar reflexes. He exhibits normal muscle tone. Coordination normal.  Skin: Skin is warm and dry.  Psychiatric: He has a normal mood and affect. His behavior is normal. Judgment and thought content normal.           Assessment & Plan:       Assessment & Plan:   Problem List Items Addressed This Visit       Unprioritized   Hypogonadism in male (Chronic)    This is being managed by [facility].       Preventative health care - Primary    Continue healthy diet and  regular exercise.  Td today.        Relevant Orders   Comp Met (CMET)   TSH   Lipid panel   CBC w/Diff   Depression with anxiety    Notes increased stress in his marriage as he disclosed some hurtful information from his past to his wife. He is doing some counseling and feels that his PHQ-9 score is elevated for this reason. Will continue lexapro 56m once daily and he will let me know if mood worsens or fails to improve in the coming months.       ADHD (attention deficit hyperactivity disorder)    Stable on adderall.  Continue current regimen/dosing.       Other Visit Diagnoses     Need for Td vaccine       Relevant Orders   Td : Tetanus/diphtheria >7yo Preservative  free (Completed)       I have discontinued DRobina Ade Wintle "Don"'s DHEA, cyanocobalamin, oxyCODONE, and diazepam. I am also having him maintain his anastrozole, OVER THE COUNTER MEDICATION, Clomid, escitalopram, amphetamine-dextroamphetamine, and amphetamine-dextroamphetamine.  Meds ordered this encounter  Medications   amphetamine-dextroamphetamine (ADDERALL) 5 MG tablet    Sig: Take 1 tablet (5 mg total) by mouth daily in the afternoon.    Dispense:  30 tablet    Refill:  0    Order Specific Question:   Supervising Provider    Answer:   BPenni HomansA [[id number]]   amphetamine-dextroamphetamine (ADDERALL XR) 10 MG 24 hr capsule    Sig: Take 1 capsule (10 mg total) by mouth daily.    Dispense:  30 capsule    Refill:  0    Order  Specific Question:   Supervising Provider    Answer:   Penni Homans A [id number]

## 2022-08-07 DIAGNOSIS — R5383 Other fatigue: Secondary | ICD-10-CM | POA: Diagnosis not present

## 2022-08-07 DIAGNOSIS — E291 Testicular hypofunction: Secondary | ICD-10-CM | POA: Diagnosis not present

## 2022-08-10 ENCOUNTER — Other Ambulatory Visit (HOSPITAL_BASED_OUTPATIENT_CLINIC_OR_DEPARTMENT_OTHER): Payer: Self-pay

## 2022-08-10 DIAGNOSIS — Z630 Problems in relationship with spouse or partner: Secondary | ICD-10-CM | POA: Diagnosis not present

## 2022-08-10 DIAGNOSIS — F329 Major depressive disorder, single episode, unspecified: Secondary | ICD-10-CM | POA: Diagnosis not present

## 2022-08-10 DIAGNOSIS — E291 Testicular hypofunction: Secondary | ICD-10-CM | POA: Diagnosis not present

## 2022-08-10 DIAGNOSIS — G479 Sleep disorder, unspecified: Secondary | ICD-10-CM | POA: Diagnosis not present

## 2022-08-10 DIAGNOSIS — F330 Major depressive disorder, recurrent, mild: Secondary | ICD-10-CM | POA: Diagnosis not present

## 2022-08-10 DIAGNOSIS — Z6825 Body mass index (BMI) 25.0-25.9, adult: Secondary | ICD-10-CM | POA: Diagnosis not present

## 2022-08-17 DIAGNOSIS — F330 Major depressive disorder, recurrent, mild: Secondary | ICD-10-CM | POA: Diagnosis not present

## 2022-08-17 DIAGNOSIS — Z630 Problems in relationship with spouse or partner: Secondary | ICD-10-CM | POA: Diagnosis not present

## 2022-08-24 DIAGNOSIS — F330 Major depressive disorder, recurrent, mild: Secondary | ICD-10-CM | POA: Diagnosis not present

## 2022-08-24 DIAGNOSIS — Z630 Problems in relationship with spouse or partner: Secondary | ICD-10-CM | POA: Diagnosis not present

## 2022-08-28 IMAGING — RF DG UGI W SINGLE CM
15 of 18 series · 15 of 18 positions shown · non-contrast
Comparison: None.

CLINICAL DATA: Morbid obesity. Pre-op evaluation for bariatric
surgery.

EXAM:
UPPER GI SERIES WITH KUB
TECHNIQUE: After obtaining a scout radiograph a routine upper GI series was
performed using thin barium.
FLUOROSCOPY TIME:  Radiation Exposure Index (if provided by the
fluoroscopic device): 11.6 mGy
Number of Acquired Spot Images: 0

[Series 1: t abdomen supine · 0.15mm/px · 1 of 1 slices shown (1 of 2)]
[im 1/1]
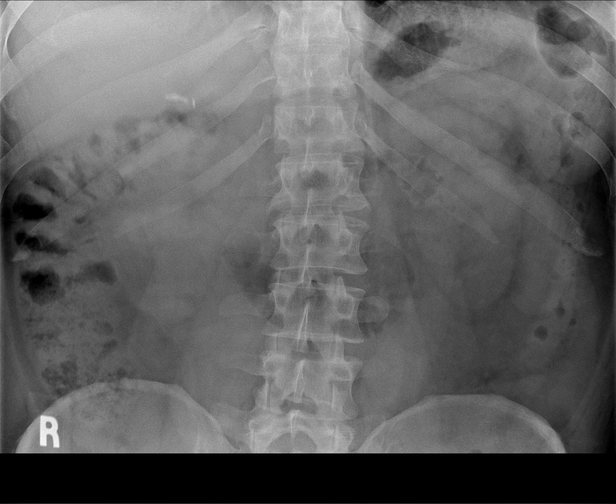

[Series 2: t abdomen supine · 0.15mm/px · 1 of 1 slices shown (2 of 2)]
[im 1/1]
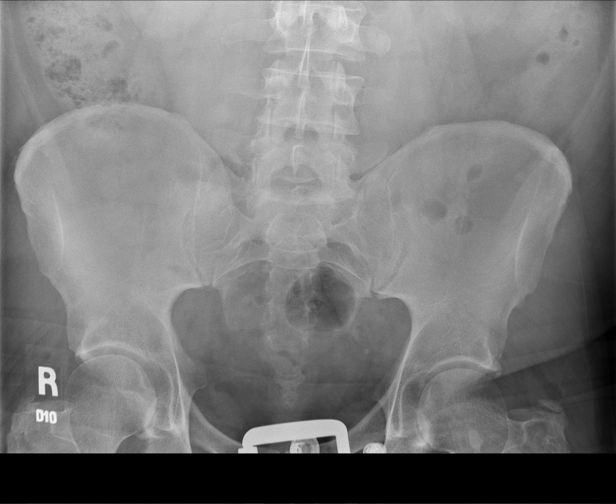

[Series 4: cp_standard · 0.26mm/px · 1 of 1 slices shown (1 of 13)]
[im 1/1]
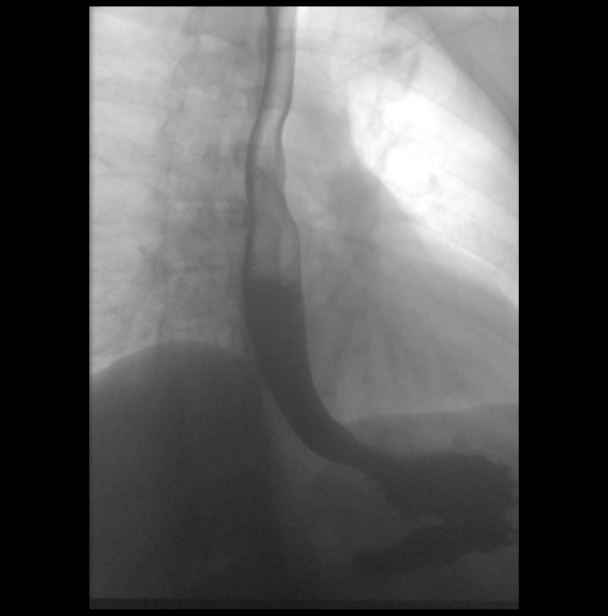

[Series 5: cp_standard · 0.26mm/px · 1 of 1 slices shown (2 of 13)]
[im 1/1]
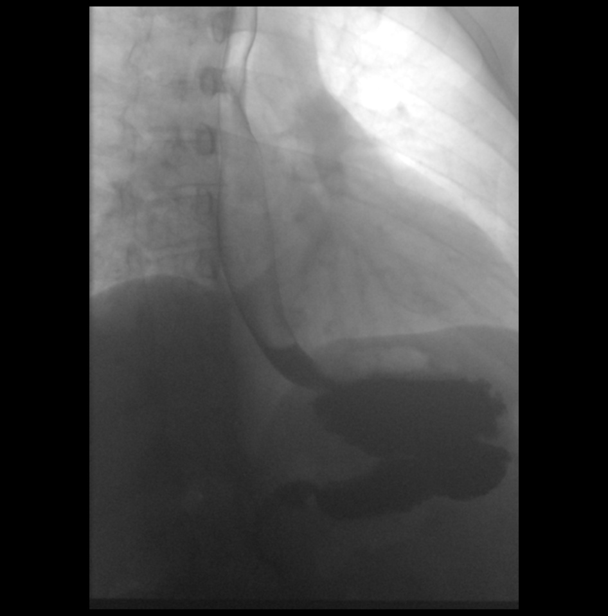

[Series 6: cp_standard · 0.28mm/px · 1 of 1 slices shown (3 of 13)]
[im 1/1]
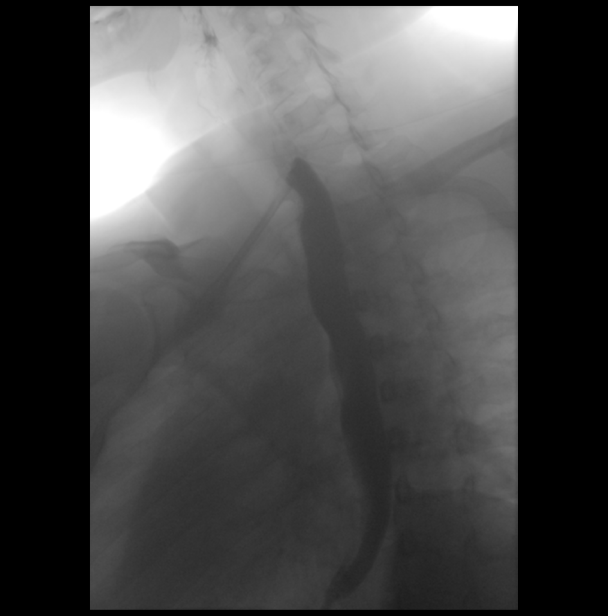

[Series 7: cp_standard · 0.28mm/px · 1 of 1 slices shown (4 of 13)]
[im 1/1]
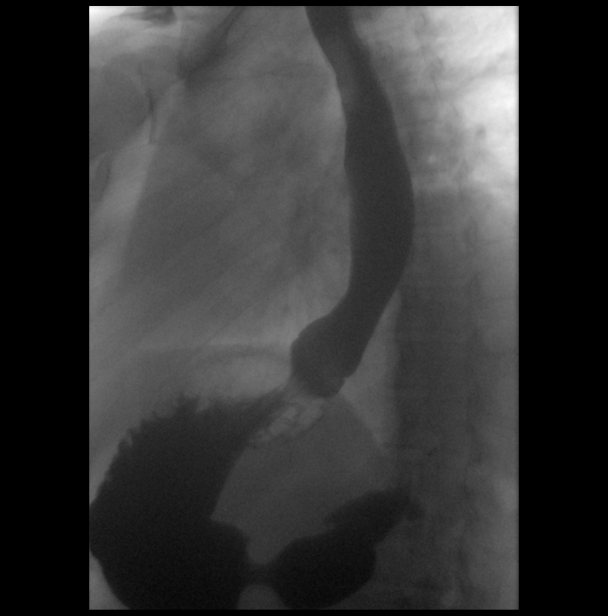

[Series 8: cp_standard · 0.28mm/px · 1 of 1 slices shown (5 of 13)]
[im 1/1]
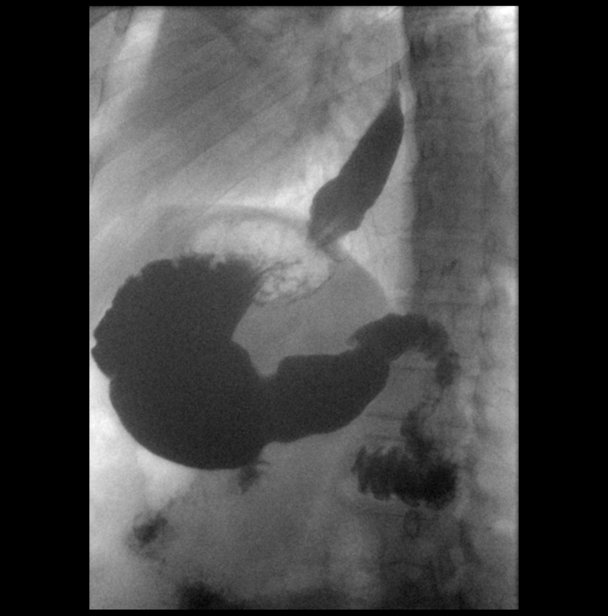

[Series 10: cp_standard · 0.19mm/px · 1 of 1 slices shown (6 of 13)]
[im 1/1]
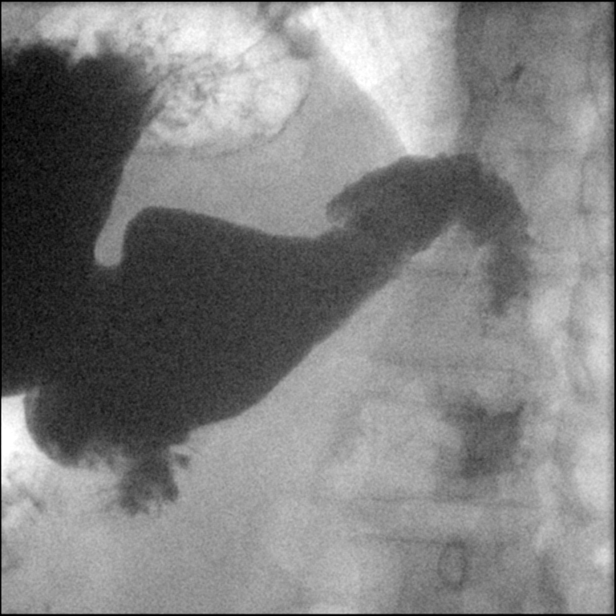

[Series 11: cp_standard · 0.19mm/px · 1 of 1 slices shown (7 of 13)]
[im 1/1]
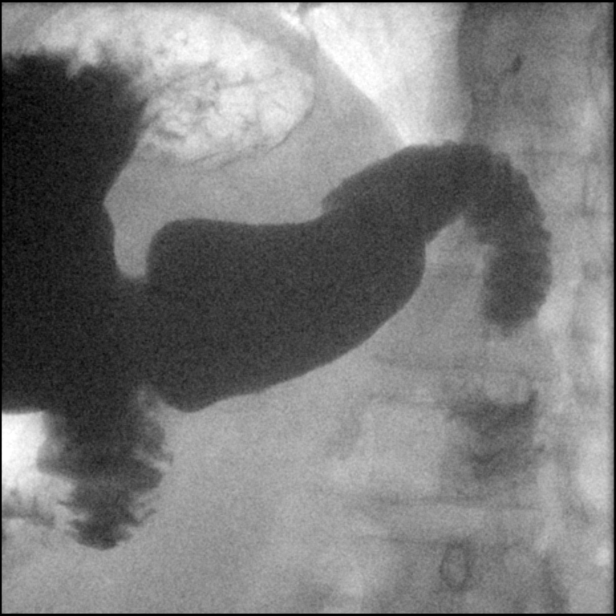

[Series 12: cp_standard · 0.19mm/px · 1 of 1 slices shown (8 of 13)]
[im 1/1]
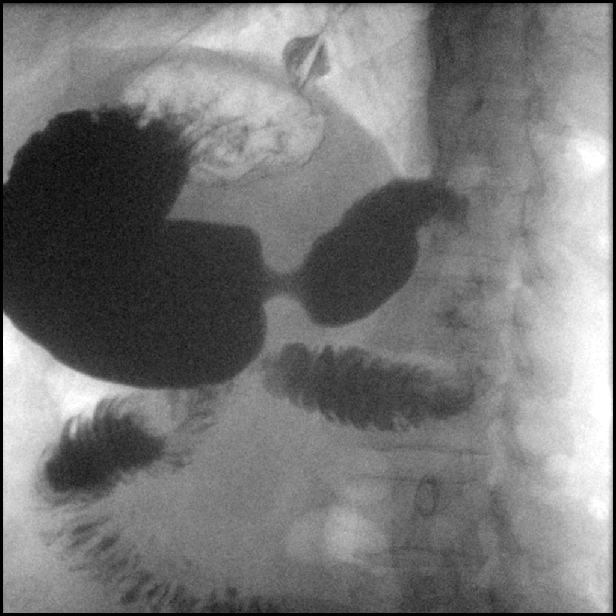

[Series 13: cp_standard · 0.18mm/px · 1 of 1 slices shown (9 of 13)]
[im 1/1]
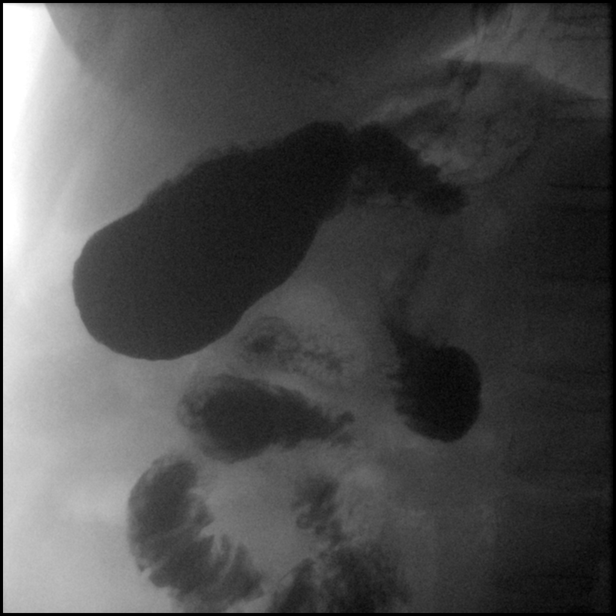

[Series 14: cp_standard · 0.18mm/px · 1 of 1 slices shown (10 of 13)]
[im 1/1]
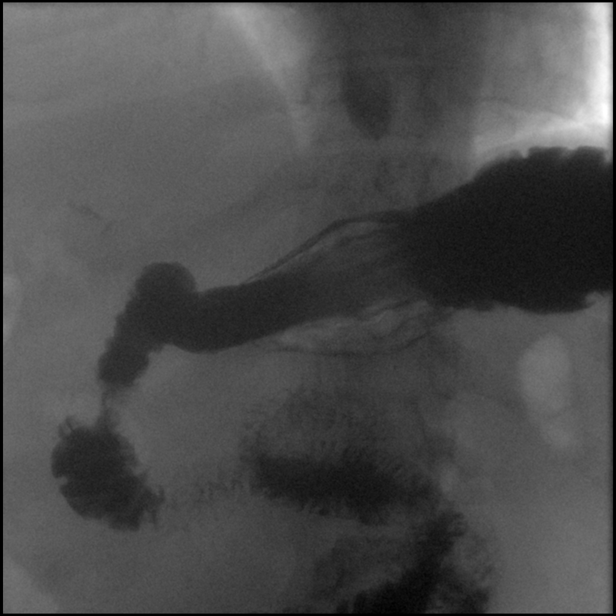

[Series 16: cp_standard · 0.19mm/px · 1 of 1 slices shown (11 of 13)]
[im 1/1]
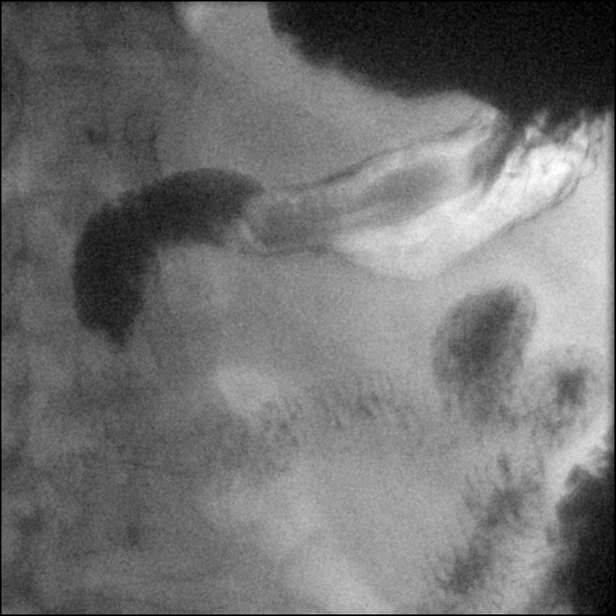

[Series 17: cp_standard · 0.19mm/px · 1 of 1 slices shown (12 of 13)]
[im 1/1]
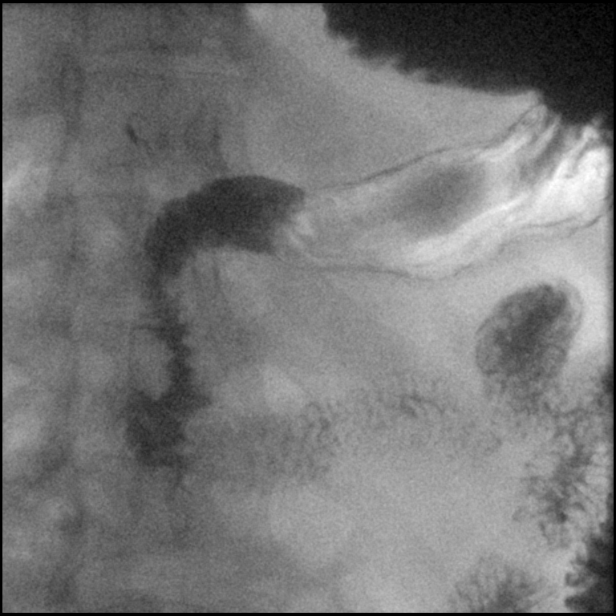

[Series 18: cp_standard · 0.19mm/px · 1 of 1 slices shown (13 of 13)]
[im 1/1]
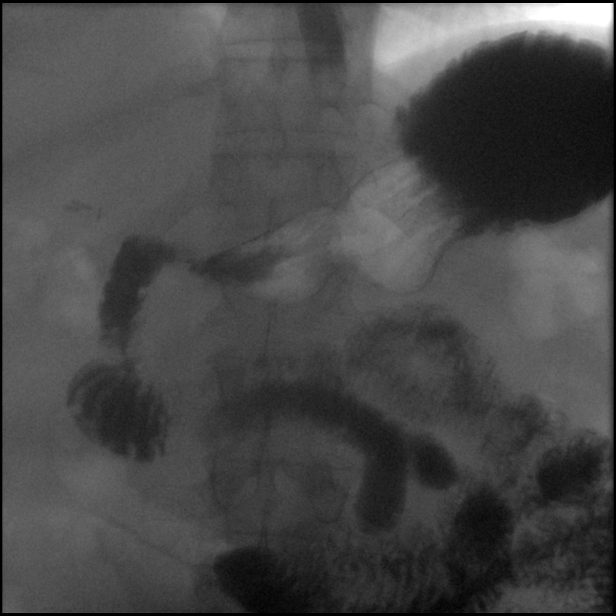

[15 of 18 positions shown; findings below may reference images not displayed]

FINDINGS: Scout radiograph: Unremarkable bowel gas pattern. Right upper
quadrant surgical clips from prior cholecystectomy.

Esophagus: No evidence of esophageal mass or stricture. Motility is
within normal limits. No gastroesophageal reflux observed.

Stomach: No hiatal hernia visualized. Normal size and appearance. No
evidence of gastric mass or ulcer.

Duodenum: Normal appearance of duodenal bulb and sweep. Normal
position of ligament of Treitz. No ulcer, stricture, or other
significant abnormality seen.

Other:  None.
IMPRESSION: Normal upper GI series.

## 2022-08-31 DIAGNOSIS — Z630 Problems in relationship with spouse or partner: Secondary | ICD-10-CM | POA: Diagnosis not present

## 2022-08-31 DIAGNOSIS — F330 Major depressive disorder, recurrent, mild: Secondary | ICD-10-CM | POA: Diagnosis not present

## 2022-09-05 ENCOUNTER — Other Ambulatory Visit (HOSPITAL_BASED_OUTPATIENT_CLINIC_OR_DEPARTMENT_OTHER): Payer: Self-pay

## 2022-09-05 ENCOUNTER — Other Ambulatory Visit: Payer: Self-pay | Admitting: Family

## 2022-09-05 MED ORDER — AMPHETAMINE-DEXTROAMPHETAMINE 5 MG PO TABS
5.0000 mg | ORAL_TABLET | Freq: Every day | ORAL | 0 refills | Status: DC
  Filled 2022-09-05 – 2022-09-06 (×2): qty 30, 30d supply, fill #0

## 2022-09-05 MED ORDER — AMPHETAMINE-DEXTROAMPHET ER 10 MG PO CP24
10.0000 mg | ORAL_CAPSULE | Freq: Every day | ORAL | 0 refills | Status: DC
  Filled 2022-09-05 – 2022-09-06 (×2): qty 30, 30d supply, fill #0

## 2022-09-07 ENCOUNTER — Other Ambulatory Visit (HOSPITAL_BASED_OUTPATIENT_CLINIC_OR_DEPARTMENT_OTHER): Payer: Self-pay

## 2022-09-07 DIAGNOSIS — F330 Major depressive disorder, recurrent, mild: Secondary | ICD-10-CM | POA: Diagnosis not present

## 2022-09-07 DIAGNOSIS — Z630 Problems in relationship with spouse or partner: Secondary | ICD-10-CM | POA: Diagnosis not present

## 2022-10-06 ENCOUNTER — Other Ambulatory Visit: Payer: Self-pay | Admitting: Family

## 2022-10-07 MED ORDER — AMPHETAMINE-DEXTROAMPHET ER 10 MG PO CP24
10.0000 mg | ORAL_CAPSULE | Freq: Every day | ORAL | 0 refills | Status: DC
  Filled 2022-10-07: qty 30, 30d supply, fill #0

## 2022-10-07 MED ORDER — AMPHETAMINE-DEXTROAMPHETAMINE 5 MG PO TABS
5.0000 mg | ORAL_TABLET | Freq: Every day | ORAL | 0 refills | Status: DC
  Filled 2022-10-07: qty 30, 30d supply, fill #0

## 2022-10-08 ENCOUNTER — Other Ambulatory Visit (HOSPITAL_BASED_OUTPATIENT_CLINIC_OR_DEPARTMENT_OTHER): Payer: Self-pay

## 2022-10-08 ENCOUNTER — Other Ambulatory Visit (HOSPITAL_COMMUNITY)
Admission: RE | Admit: 2022-10-08 | Discharge: 2022-10-08 | Disposition: A | Discharge status: Home / Self Care | Payer: BC Managed Care – PPO | Source: Ambulatory Visit | Attending: Family | Admitting: Family

## 2022-10-08 ENCOUNTER — Ambulatory Visit: Payer: BC Managed Care – PPO | Admitting: Family

## 2022-10-08 ENCOUNTER — Encounter: Payer: Self-pay | Admitting: Family

## 2022-10-08 VITALS — BP 130/82 | HR 69 | Temp 98.4°F | Resp 16 | Wt 193.0 lb

## 2022-10-08 DIAGNOSIS — Z113 Encounter for screening for infections with a predominantly sexual mode of transmission: Secondary | ICD-10-CM | POA: Diagnosis not present

## 2022-10-08 DIAGNOSIS — Z9189 Other specified personal risk factors, not elsewhere classified: Secondary | ICD-10-CM

## 2022-10-08 NOTE — Progress Notes (Signed)
Subjective:   By signing my name below, I, [name], attest that this documentation has been prepared under the direction and in the presence of Sandford Craze, NP. 10/08/2022   Patient ID: John Faulkner, male    DOB: [birth year], 37 y.o.   MRN: [id number]  Chief Complaint  Patient presents with   STD testing    Will like to be tested for STD    HPI Patient is in today for a office visit.   STD testing: He is requesting a STD testing. He denies having new symptoms. He reports having a sexual encounter with another male years ago and tested negative for HIV following that encounter. He has no new sexual partners at this time. He disclosed this to his wife and she requested a divorce. He states that he is currently living in the basement and has a down payment on a house but they don't plan to tell the children until the summer.   Blood pressure: His blood pressure is elevated during this visit. He notes feeling more anxious than normal prior to this visit.  BP Readings from Last 3 Encounters:  10/08/22 130/82  08/06/22 136/69  02/16/22 137/89   Pulse Readings from Last 3 Encounters:  10/08/22 69  08/06/22 64  02/16/22 (!) 48    Past Medical History:  Diagnosis Date   Acute calculous cholecystitis 08/13/2011   ADHD (attention deficit hyperactivity disorder)    takes aderall .02/05/2022   Anal fissure    Anxiety    Depression    History of hypertension    none since weight loss 2 yrs ago per pt on 02-06-2022   History of sleep apnea    none since weight loss 1 and 1/2 yrs ago   Hypogonadism in male    Nephrotic syndrome 57   age 63 yrs old resolved    Past Surgical History:  Procedure Laterality Date   abdominoplasty     ABDOMINOPLASTY  02/2022   CARPAL TUNNEL RELEASE Left 2021   CHOLECYSTECTOMY  08/13/2011   Procedure: LAPAROSCOPIC CHOLECYSTECTOMY WITH INTRAOPERATIVE CHOLANGIOGRAM;  Surgeon: Rulon Abide, DO;  Location: [facility];  Service: General;   Laterality: N/A;      circumferential abdominoplasty     Sept 2023   EVALUATION UNDER ANESTHESIA WITH HEMORRHOIDECTOMY N/A 02/16/2022   Procedure: EXAM UNDER ANESTHESIA WITH HEMORRHOIDECTOMY;  Surgeon: Karie Soda, MD;  Location: [facility];  Service: General;  Laterality: N/A;   LAPAROSCOPIC GASTRIC SLEEVE RESECTION N/A 10/10/2020   Procedure: LAPAROSCOPIC GASTRIC SLEEVE RESECTION;  Surgeon: Berna Bue, MD;  [facility];  Service: General;  Laterality: N/A;   MANDIBLE OSTEOTOMY N/A 06/10/2021   Procedure: MANDIBULAR OSTEOTOMY;  Surgeon: Exie Parody, DMD;  Location[facility];  Service: Oral Surgery;  Laterality: N/A;   MAXILLA OSTEOTOMY N/A 06/10/2021   Procedure: LEFORT 1 MAXILLARY OSTEOTOMY;  Surgeon: Exie Parody, DMD;  Location: [facility];  Service: Oral Surgery;  Laterality: N/A;   SPHINCTEROTOMY N/A 02/16/2022   Procedure: INTERNAL SPHINCTEROTOMY;  Surgeon: Karie Soda, MD;  Location[facility];  Service: General;  Laterality: N/A;   TONSILLECTOMY  06/26/1999   UPPER GI ENDOSCOPY N/A 10/10/2020   Procedure: UPPER GI ENDOSCOPY;  Surgeon: Berna Bue, MD;  Location: [facility];  Service: General;  Laterality: N/A;    Family History  Problem Relation Age of Onset   Pneumonia Mother        covid 43 pneumonia  COPD Father    Diabetes Father    Heart disease Father 58   Stroke Father    Colon polyps Father        in his late 39s   Diabetes Maternal Grandfather    Diabetes Paternal Grandfather    Colon cancer Neg Hx    Stomach cancer Neg Hx    Esophageal cancer Neg Hx    Pancreatic cancer Neg Hx     Social History   Socioeconomic History   Marital status: Legally Separated    Spouse name: Not on file   Number of children: Not on file   Years of education: Not on file   Highest education level: Bachelor's degree (e.g., BA, AB, BS)  Occupational History   Not on file  Tobacco Use   Smoking status: Never   Smokeless  tobacco: Never  Vaping Use   Vaping Use: Never used  Substance and Sexual Activity   Alcohol use: No   Drug use: No   Sexual activity: Not on file  Other Topics Concern   Not on file  Social History Narrative   Work or School:       Home Situation: lives with wife, 2 children   Daughter 2014   Son 2018      Has a cat a Nurse, mental health      Spiritual Beliefs: none      Lifestyle: no regular exercise; poor      Works for a Tax inspector      Social Determinants of Health   Financial Resource Strain: Low Risk  (10/06/2022)   Overall Financial Resource Strain (CARDIA)    Difficulty of Paying Living Expenses: Not very hard  Food Insecurity: No Food Insecurity (10/06/2022)   Hunger Vital Sign    Worried About Running Out of Food in the Last Year: Never true    Ran Out of Food in the Last Year: Never true  Transportation Needs: No Transportation Needs (10/06/2022)   PRAPARE - Administrator, Civil Service (Medical): No    Lack of Transportation (Non-Medical): No  Physical Activity: Unknown (10/06/2022)   Exercise Vital Sign    Days of Exercise per Week: 0 days    Minutes of Exercise per Session: Not on file  Stress: Stress Concern Present (10/06/2022)   [employer] of Occupational Health - Occupational Stress Questionnaire    Feeling of Stress : To some extent  Social Connections: Socially Isolated (10/06/2022)   Social Connection and Isolation Panel [NHANES]    Frequency of Communication with Friends and Family: More than three times a week    Frequency of Social Gatherings with Friends and Family: Once a week    Attends Religious Services: Never    Database administrator or Organizations: No    Attends Engineer, structural: Not on file    Marital Status: Separated  Intimate Partner Violence: Not on file    Outpatient Medications Prior to Visit  Medication Sig Dispense Refill   amphetamine-dextroamphetamine (ADDERALL XR) 10 MG 24 hr capsule  Take 1 capsule (10 mg total) by mouth daily. 30 capsule 0   amphetamine-dextroamphetamine (ADDERALL) 5 MG tablet Take 1 tablet (5 mg total) by mouth daily in the afternoon. 30 tablet 0   anastrozole (ARIMIDEX) 1 MG tablet Take 1 mg by mouth every Monday.     clomiPHENE (CLOMID) 50 MG tablet Take 0.5 tablets (25 mg total) by mouth daily.     escitalopram (LEXAPRO)  10 MG tablet TAKE 1 TABLET DAILY 90 tablet 1   OVER THE COUNTER MEDICATION Take 1 capsule by mouth 2 (two) times a week. DIM Tuesday and saturday     No facility-administered medications prior to visit.    No Known Allergies  ROS See HPI    Objective:    Physical Exam Constitutional:      General: He is not in acute distress.    Appearance: Normal appearance. He is not ill-appearing.  HENT:     Head: Normocephalic and atraumatic.     Right Ear: External ear normal.     Left Ear: External ear normal.  Eyes:     Extraocular Movements: Extraocular movements intact.     Pupils: Pupils are equal, round, and reactive to light.  Cardiovascular:     Rate and Rhythm: Normal rate and regular rhythm.     Heart sounds: Normal heart sounds. No murmur heard.    No gallop.     Comments: Blood pressure measured 130/82 during manual recheck Pulmonary:     Effort: Pulmonary effort is normal. No respiratory distress.     Breath sounds: Normal breath sounds. No wheezing or rales.  Skin:    General: Skin is warm and dry.  Neurological:     Mental Status: He is alert and oriented to person, place, and time.  Psychiatric:        Judgment: Judgment normal.     BP 130/82   Pulse 69   Temp 98.4 F (36.9 C) (Oral)   Resp 16   Wt 193 lb (87.5 kg)   SpO2 100%   BMI 26.18 kg/m  Wt Readings from Last 3 Encounters:  10/08/22 193 lb (87.5 kg)  08/06/22 190 lb (86.2 kg)  02/16/22 190 lb 6.4 oz (86.4 kg)       Assessment & Plan:  Screening examination for STD (sexually transmitted disease) -     HIV Antibody (routine testing w  rflx) -     RPR -     Urine cytology ancillary only -     Hepatitis C antibody -     Hepatitis B surface antigen -     Herpes Simplex Virus 2(IgG)w/rflx to HSV2 Inhibition  At risk for HIV due to bisexual contact Assessment & Plan: We discussed HIV prep therapy to decrease his risk of contracting HIV.  He is interested in starting Prep therapy.  Will obtain HIV screening first.      I, Lemont Fillers, NP, personally preformed the services described in this documentation.  All medical record entries made by the scribe were at my direction and in my presence.  I have reviewed the chart and discharge instructions (if applicable) and agree that the record reflects my personal performance and is accurate and complete. 10/08/2022   I,[name],acting as a scribe for Lemont Fillers, NP.,have documented all relevant documentation on the behalf of Lemont Fillers, NP,as directed by  Lemont Fillers, NP while in the presence of Lemont Fillers, NP.   Lemont Fillers, NP

## 2022-10-08 NOTE — Assessment & Plan Note (Signed)
We discussed HIV prep therapy to decrease his risk of contracting HIV.  He is interested in starting Prep therapy.  Will obtain HIV screening first.

## 2022-10-09 LAB — RPR: RPR Ser Ql: NONREACTIVE

## 2022-10-09 LAB — HERPES SIMPLEX VIRUS 2(IGG) W/ REFLEX TO HSV2 INHIBITION: HSV 2 Glycoprotein G Ab, IgG: <0.9 index

## 2022-10-09 LAB — URINE CYTOLOGY ANCILLARY ONLY
Chlamydia: NEGATIVE
Comment: NEGATIVE
Comment: NEGATIVE
Comment: NORMAL
Neisseria Gonorrhea: NEGATIVE
Trichomonas: NEGATIVE

## 2022-10-09 LAB — HIV ANTIBODY (ROUTINE TESTING W REFLEX): HIV 1&2 Ab, 4th Generation: NONREACTIVE

## 2022-10-09 LAB — HEPATITIS C ANTIBODY: Hepatitis C Ab: NONREACTIVE

## 2022-10-09 LAB — HEPATITIS B SURFACE ANTIGEN: Hepatitis B Surface Ag: NONREACTIVE

## 2022-11-04 ENCOUNTER — Other Ambulatory Visit: Payer: Self-pay | Admitting: Family

## 2022-11-04 MED ORDER — AMPHETAMINE-DEXTROAMPHETAMINE 5 MG PO TABS
5.0000 mg | ORAL_TABLET | Freq: Every day | ORAL | 0 refills | Status: DC
  Filled 2022-11-04: qty 30, 30d supply, fill #0

## 2022-11-04 MED ORDER — AMPHETAMINE-DEXTROAMPHET ER 10 MG PO CP24
10.0000 mg | ORAL_CAPSULE | Freq: Every day | ORAL | 0 refills | Status: DC
  Filled 2022-11-04: qty 30, 30d supply, fill #0

## 2022-11-05 ENCOUNTER — Other Ambulatory Visit (HOSPITAL_BASED_OUTPATIENT_CLINIC_OR_DEPARTMENT_OTHER): Payer: Self-pay

## 2022-11-05 ENCOUNTER — Other Ambulatory Visit: Payer: Self-pay

## 2022-11-07 DIAGNOSIS — E291 Testicular hypofunction: Secondary | ICD-10-CM | POA: Diagnosis not present

## 2022-11-09 DIAGNOSIS — Z6826 Body mass index (BMI) 26.0-26.9, adult: Secondary | ICD-10-CM | POA: Diagnosis not present

## 2022-11-09 DIAGNOSIS — N529 Male erectile dysfunction, unspecified: Secondary | ICD-10-CM | POA: Diagnosis not present

## 2022-11-09 DIAGNOSIS — G479 Sleep disorder, unspecified: Secondary | ICD-10-CM | POA: Diagnosis not present

## 2022-11-09 DIAGNOSIS — E291 Testicular hypofunction: Secondary | ICD-10-CM | POA: Diagnosis not present

## 2022-12-03 ENCOUNTER — Other Ambulatory Visit: Payer: Self-pay | Admitting: Family

## 2022-12-03 MED ORDER — AMPHETAMINE-DEXTROAMPHET ER 10 MG PO CP24
10.0000 mg | ORAL_CAPSULE | Freq: Every day | ORAL | 0 refills | Status: DC
  Filled 2022-12-03 – 2022-12-05 (×2): qty 30, 30d supply, fill #0

## 2022-12-03 MED ORDER — AMPHETAMINE-DEXTROAMPHETAMINE 5 MG PO TABS
5.0000 mg | ORAL_TABLET | Freq: Every day | ORAL | 0 refills | Status: DC
  Filled 2022-12-03 – 2022-12-05 (×2): qty 30, 30d supply, fill #0

## 2022-12-04 ENCOUNTER — Other Ambulatory Visit (HOSPITAL_BASED_OUTPATIENT_CLINIC_OR_DEPARTMENT_OTHER): Payer: Self-pay

## 2022-12-05 ENCOUNTER — Other Ambulatory Visit (HOSPITAL_BASED_OUTPATIENT_CLINIC_OR_DEPARTMENT_OTHER): Payer: Self-pay

## 2022-12-06 ENCOUNTER — Other Ambulatory Visit: Payer: Self-pay

## 2023-01-05 ENCOUNTER — Other Ambulatory Visit: Payer: Self-pay | Admitting: Family

## 2023-01-07 ENCOUNTER — Encounter: Payer: Self-pay | Admitting: Family

## 2023-01-08 ENCOUNTER — Other Ambulatory Visit (HOSPITAL_BASED_OUTPATIENT_CLINIC_OR_DEPARTMENT_OTHER): Payer: Self-pay

## 2023-01-08 MED ORDER — AMPHETAMINE-DEXTROAMPHETAMINE 5 MG PO TABS
5.0000 mg | ORAL_TABLET | Freq: Every day | ORAL | 0 refills | Status: DC
  Filled 2023-01-08: qty 30, 30d supply, fill #0

## 2023-01-08 MED ORDER — AMPHETAMINE-DEXTROAMPHET ER 10 MG PO CP24
10.0000 mg | ORAL_CAPSULE | Freq: Every day | ORAL | 0 refills | Status: DC
  Filled 2023-01-08: qty 30, 30d supply, fill #0

## 2023-01-08 NOTE — Telephone Encounter (Signed)
Requesting: Adderall XR 10mg  and 5mg   Contract: 01/29/22 UDS: 01/29/22 Last Visit: 10/08/22 Next Visit: 02/04/23 Last Refill: 12/03/22 #30 and 0RF   Please Advise

## 2023-01-16 ENCOUNTER — Other Ambulatory Visit: Payer: Self-pay | Admitting: Family

## 2023-01-23 ENCOUNTER — Encounter (INDEPENDENT_AMBULATORY_CARE_PROVIDER_SITE_OTHER): Payer: Self-pay

## 2023-02-04 ENCOUNTER — Ambulatory Visit: Payer: BC Managed Care – PPO | Admitting: Family

## 2023-02-06 DIAGNOSIS — E291 Testicular hypofunction: Secondary | ICD-10-CM | POA: Diagnosis not present

## 2023-02-07 ENCOUNTER — Other Ambulatory Visit: Payer: Self-pay | Admitting: Family

## 2023-02-07 ENCOUNTER — Other Ambulatory Visit (HOSPITAL_BASED_OUTPATIENT_CLINIC_OR_DEPARTMENT_OTHER): Payer: Self-pay

## 2023-02-07 MED ORDER — AMPHETAMINE-DEXTROAMPHET ER 10 MG PO CP24
10.0000 mg | ORAL_CAPSULE | Freq: Every day | ORAL | 0 refills | Status: DC
  Filled 2023-02-07: qty 30, 30d supply, fill #0

## 2023-02-07 MED ORDER — AMPHETAMINE-DEXTROAMPHETAMINE 5 MG PO TABS
5.0000 mg | ORAL_TABLET | Freq: Every day | ORAL | 0 refills | Status: DC
  Filled 2023-02-07: qty 30, 30d supply, fill #0

## 2023-02-11 ENCOUNTER — Ambulatory Visit: Payer: BC Managed Care – PPO | Admitting: Family

## 2023-02-13 DIAGNOSIS — R7989 Other specified abnormal findings of blood chemistry: Secondary | ICD-10-CM | POA: Diagnosis not present

## 2023-02-13 DIAGNOSIS — R6882 Decreased libido: Secondary | ICD-10-CM | POA: Diagnosis not present

## 2023-02-13 DIAGNOSIS — Z6827 Body mass index (BMI) 27.0-27.9, adult: Secondary | ICD-10-CM | POA: Diagnosis not present

## 2023-02-13 DIAGNOSIS — G479 Sleep disorder, unspecified: Secondary | ICD-10-CM | POA: Diagnosis not present

## 2023-02-18 ENCOUNTER — Ambulatory Visit: Payer: BC Managed Care – PPO | Admitting: Family

## 2023-02-18 VITALS — BP 136/76 | HR 77 | Temp 98.2°F | Resp 16 | Wt 201.0 lb

## 2023-02-18 DIAGNOSIS — I10 Essential (primary) hypertension: Secondary | ICD-10-CM

## 2023-02-18 DIAGNOSIS — M545 Low back pain, unspecified: Secondary | ICD-10-CM | POA: Diagnosis not present

## 2023-02-18 DIAGNOSIS — F909 Attention-deficit hyperactivity disorder, unspecified type: Secondary | ICD-10-CM | POA: Diagnosis not present

## 2023-02-18 DIAGNOSIS — F418 Other specified anxiety disorders: Secondary | ICD-10-CM | POA: Diagnosis not present

## 2023-02-18 NOTE — Patient Instructions (Signed)
VISIT SUMMARY:  During your visit, we discussed your recent back pain, your ADHD, and your mood. Your back pain, which started after a fall, is improving. Your ADHD is well controlled with your current medication, and your mood is stable with Lexapro. We also discussed your general health maintenance.  YOUR PLAN:  -LOWER BACK PAIN: Your back pain is likely due to your recent fall. It's improving, but continue to manage it with over-the-counter pain relievers. If it doesn't continue to improve, come back for another evaluation.  -ADHD: Your ADHD is well controlled with your current medication, Adderall. Continue taking it as prescribed.  -DEPRESSION: Your mood is stable with your current medication, Lexapro. Continue taking it as prescribed.  -GENERAL HEALTH MAINTENANCE: For your general health, plan to get your flu shot and COVID-19 booster in the fall at the pharmacy. Also, schedule a follow-up visit in six months for a physical exam.  INSTRUCTIONS:  Remember to get your flu shot and COVID-19 booster in the fall at the pharmacy. Also, schedule a follow-up visit in six months for a physical exam.

## 2023-02-18 NOTE — Assessment & Plan Note (Signed)
Stable on lexapro.  Continue same . 

## 2023-02-18 NOTE — Addendum Note (Signed)
Addended by: Wilford Corner on: 02/18/2023 08:55 AM   Modules accepted: Orders

## 2023-02-18 NOTE — Assessment & Plan Note (Signed)
Mild, improving.  Recent fall with subsequent bruising and swelling. Reports occasional sharp pain and aching after prolonged standing or straining. No signs of severe injury such as fracture. Improvement noted. -Continue self-management with over-the-counter analgesics as needed. -Return for evaluation if symptoms do not continue to improve.

## 2023-02-18 NOTE — Progress Notes (Signed)
Subjective:     Patient ID: John Faulkner, male    DOB: 03-20-1986, 37 y.o.   MRN: [id number]  Chief Complaint  Patient presents with   ADHD    Here for follow up   Back Pain    Complains of some back pain since falling 2 weeks ago    Back Pain    Discussed the use of AI scribe software for clinical note transcription with the patient, who gave verbal consent to proceed.  History of Present Illness   The patient, with a history of ADHD and recent separation from his spouse, presents for follow-up of ADHD and new onset back pain. The back pain began after a fall two weeks ago when he was intoxicated. He fell against a wall, landing on his lower back. The next day, he noticed swelling and bruising in the area. The swelling resolved the same day, but the bruising persisted and migrated downward. He has since noticed occasional sharp pain in the area when standing on one leg and moving suddenly. The pain is improving, but he has noticed a new ache in his back after long days at work. He has been managing the pain with ibuprofen.  His ADHD is well controlled on Adderall XR 10mg  in the morning and Adderall 5mg  in the afternoon. He denies any side effects from the medication. He is also taking Lexapro for mood, which is effective.      Wt Readings from Last 3 Encounters:  02/18/23 201 lb (91.2 kg)  10/08/22 193 lb (87.5 kg)  08/06/22 190 lb (86.2 kg)       Health Maintenance Due  Topic Date Due   COVID-19 Vaccine (4 - 2023-24 season) 02/23/2022   INFLUENZA VACCINE  01/24/2023    Past Medical History:  Diagnosis Date   Acute calculous cholecystitis 08/13/2011   ADHD (attention deficit hyperactivity disorder)    takes aderall .02/05/2022   Anal fissure    Anxiety    Depression    History of hypertension    none since weight loss 2 yrs ago per pt on 02-06-2022   History of sleep apnea    none since weight loss 1 and 1/2 yrs ago   Hypogonadism in male    Nephrotic  syndrome 72   age 85 yrs old resolved    Past Surgical History:  Procedure Laterality Date   abdominoplasty     ABDOMINOPLASTY  02/2022   CARPAL TUNNEL RELEASE Left 2021   CHOLECYSTECTOMY  08/13/2011   Procedure: LAPAROSCOPIC CHOLECYSTECTOMY WITH INTRAOPERATIVE CHOLANGIOGRAM;  Surgeon: Rulon Abide, DO;  Location: [facility] OR;  Service: General;  Laterality: N/A;      circumferential abdominoplasty     Sept 2023   EVALUATION UNDER ANESTHESIA WITH HEMORRHOIDECTOMY N/A 02/16/2022   Procedure: EXAM UNDER ANESTHESIA WITH HEMORRHOIDECTOMY;  Surgeon: Karie Soda, MD;  Location: [facility] [place];  Service: General;  Laterality: N/A;   LAPAROSCOPIC GASTRIC SLEEVE RESECTION N/A 10/10/2020   Procedure: LAPAROSCOPIC GASTRIC SLEEVE RESECTION;  Surgeon: Berna Bue, MD;  Location: [facility];  Service: General;  Laterality: N/A;   MANDIBLE OSTEOTOMY N/A 06/10/2021   Procedure: MANDIBULAR OSTEOTOMY;  Surgeon: Exie Parody, DMD;  Location: [facility] OR;  Service: Oral Surgery;  Laterality: N/A;   MAXILLA OSTEOTOMY N/A 06/10/2021   Procedure: LEFORT 1 MAXILLARY OSTEOTOMY;  Surgeon: Exie Parody, DMD;  Location: [facility] OR;  Service: Oral Surgery;  Laterality: N/A;   SPHINCTEROTOMY N/A 02/16/2022   Procedure: INTERNAL  SPHINCTEROTOMY;  Surgeon: Karie Soda, MD;  Location: [facility];  Service: General;  Laterality: N/A;   TONSILLECTOMY  06/26/1999   UPPER GI ENDOSCOPY N/A 10/10/2020   Procedure: UPPER GI ENDOSCOPY;  Surgeon: Berna Bue, MD;  Location: [facility];  Service: General;  Laterality: N/A;    Family History  Problem Relation Age of Onset   Pneumonia Mother        covid 70 pneumonia   COPD Father    Diabetes Father    Heart disease Father 40   Stroke Father    Colon polyps Father        in his late 67s   Diabetes Maternal Grandfather    Diabetes Paternal Grandfather    Colon cancer Neg Hx    Stomach cancer Neg Hx    Esophageal cancer Neg Hx     Pancreatic cancer Neg Hx     Social History   Socioeconomic History   Marital status: Legally Separated    Spouse name: Not on file   Number of children: Not on file   Years of education: Not on file   Highest education level: Bachelor's degree (e.g., BA, AB, BS)  Occupational History   Not on file  Tobacco Use   Smoking status: Never   Smokeless tobacco: Never  Vaping Use   Vaping status: Never Used  Substance and Sexual Activity   Alcohol use: No   Drug use: No   Sexual activity: Not on file  Other Topics Concern   Not on file  Social History Narrative   Work or School:       Home Situation: lives with wife, 2 children   Daughter 2014   Son 2018      Has a cat a Nurse, mental health      Spiritual Beliefs: none      Lifestyle: no regular exercise; poor      Works for a Tax [employer]      Social Determinants of Health   Financial Resource Strain: Low Risk  (10/06/2022)   Overall Financial Resource Strain (CARDIA)    Difficulty of Paying Living Expenses: Not very hard  Food Insecurity: No Food Insecurity (10/06/2022)   Hunger Vital Sign    Worried About Running Out of Food in the Last Year: Never true    Ran Out of Food in the Last Year: Never true  Transportation Needs: No Transportation Needs (10/06/2022)   PRAPARE - Administrator, Civil Service (Medical): No    Lack of Transportation (Non-Medical): No  Physical Activity: Unknown (10/06/2022)   Exercise Vital Sign    Days of Exercise per Week: 0 days    Minutes of Exercise per Session: Not on file  Stress: Stress Concern Present (10/06/2022)   [employer] of Occupational Health - Occupational Stress Questionnaire    Feeling of Stress : To some extent  Social Connections: Socially Isolated (10/06/2022)   Social Connection and Isolation Panel [NHANES]    Frequency of Communication with Friends and Family: More than three times a week    Frequency of Social Gatherings with Friends and Family:  Once a week    Attends Religious Services: Never    Database administrator or Organizations: No    Attends Banker Meetings: Not on file    Marital Status: Separated  Intimate Partner Violence: Unknown (09/26/2021)   Received from [employer], [facility]   HITS    Physically Hurt: Not  on file    Insult or Talk Down To: Not on file    Threaten Physical Harm: Not on file    Scream or Curse: Not on file    Outpatient Medications Prior to Visit  Medication Sig Dispense Refill   amphetamine-dextroamphetamine (ADDERALL XR) 10 MG 24 hr capsule Take 1 capsule (10 mg total) by mouth daily. 30 capsule 0   amphetamine-dextroamphetamine (ADDERALL) 5 MG tablet Take 1 tablet (5 mg total) by mouth daily in the afternoon. 30 tablet 0   clomiPHENE (CLOMID) 50 MG tablet Take 0.5 tablets (25 mg total) by mouth daily.     escitalopram (LEXAPRO) 10 MG tablet Take 1 tablet (10 mg total) by mouth daily. 90 tablet 0   OVER THE COUNTER MEDICATION Take 1 capsule by mouth 2 (two) times a week. DIM Tuesday and saturday     anastrozole (ARIMIDEX) 1 MG tablet Take 1 mg by mouth every Monday.     No facility-administered medications prior to visit.    No Known Allergies  Review of Systems  Musculoskeletal:  Positive for back pain.   See HPI    Objective:    Physical Exam Constitutional:      Appearance: Normal appearance.  HENT:     Head: Normocephalic and atraumatic.  Eyes:     General: No scleral icterus. Pulmonary:     Effort: Pulmonary effort is normal.  Skin:    General: Skin is warm and dry.  Neurological:     Mental Status: He is alert and oriented to person, place, and time.  Psychiatric:        Mood and Affect: Mood normal.        Behavior: Behavior normal.        Thought Content: Thought content normal.        Judgment: Judgment normal.      BP 136/76 (BP Location: Right Arm, Patient Position: Sitting, Cuff Size: Small)   Pulse 77   Temp 98.2 F (36.8 C) (Oral)    Resp 16   Wt 201 lb (91.2 kg)   SpO2 100%   BMI 27.26 kg/m  Wt Readings from Last 3 Encounters:  02/18/23 201 lb (91.2 kg)  10/08/22 193 lb (87.5 kg)  08/06/22 190 lb (86.2 kg)       Assessment & Plan:   Problem List Items Addressed This Visit       Unprioritized   Low back pain without sciatica    Mild, improving.  Recent fall with subsequent bruising and swelling. Reports occasional sharp pain and aching after prolonged standing or straining. No signs of severe injury such as fracture. Improvement noted. -Continue self-management with over-the-counter analgesics as needed. -Return for evaluation if symptoms do not continue to improve.      Hypertension - Primary    BP stable without medications. Monitor.       Depression with anxiety    Stable on lexapro. Continue same.       ADHD (attention deficit hyperactivity disorder)    Stable on current regimen of Adderall XR 10mg  in the morning and Adderall 5mg  in the afternoon. No reported side effects. -Continue current medication regimen. -Updated medication contract. -obtain updated UDS.       I have discontinued Roylene Reason. Posa "Don"'s anastrozole. I am also having him maintain his OVER THE COUNTER MEDICATION, Clomid, escitalopram, amphetamine-dextroamphetamine, and amphetamine-dextroamphetamine.  No orders of the defined types were placed in this encounter.

## 2023-02-18 NOTE — Assessment & Plan Note (Signed)
>>  ASSESSMENT AND PLAN FOR DEPRESSION WITH ANXIETY WRITTEN ON 02/18/2023  8:46 AM BY O'SULLIVAN, MELISSA, NP  Stable on lexapro . Continue same.

## 2023-02-18 NOTE — Assessment & Plan Note (Signed)
BP stable without medications. Monitor.

## 2023-02-18 NOTE — Assessment & Plan Note (Signed)
Stable on current regimen of Adderall XR 10mg  in the morning and Adderall 5mg  in the afternoon. No reported side effects. -Continue current medication regimen. -Updated medication contract. -obtain updated UDS.

## 2023-02-20 LAB — DRUG MONITORING PANEL 376104, URINE
Amphetamine: 9224 ng/mL — ABNORMAL HIGH (ref <250)
Amphetamines: POSITIVE ng/mL — AB (ref <500)
Barbiturates: NEGATIVE ng/mL (ref <300)
Benzodiazepines: NEGATIVE ng/mL (ref <100)
Cocaine Metabolite: NEGATIVE ng/mL (ref <150)
Desmethyltramadol: NEGATIVE ng/mL (ref <100)
Methamphetamine: NEGATIVE ng/mL (ref <250)
Opiates: NEGATIVE ng/mL (ref <100)
Oxycodone: NEGATIVE ng/mL (ref <100)
Tramadol: NEGATIVE ng/mL (ref <100)

## 2023-02-20 LAB — DM TEMPLATE

## 2023-03-12 ENCOUNTER — Other Ambulatory Visit (HOSPITAL_BASED_OUTPATIENT_CLINIC_OR_DEPARTMENT_OTHER): Payer: Self-pay

## 2023-03-12 ENCOUNTER — Other Ambulatory Visit: Payer: Self-pay

## 2023-03-12 ENCOUNTER — Other Ambulatory Visit: Payer: Self-pay | Admitting: Family

## 2023-03-12 MED ORDER — AMPHETAMINE-DEXTROAMPHETAMINE 5 MG PO TABS
5.0000 mg | ORAL_TABLET | Freq: Every day | ORAL | 0 refills | Status: DC
  Filled 2023-03-12: qty 30, 30d supply, fill #0

## 2023-03-12 MED ORDER — AMPHETAMINE-DEXTROAMPHET ER 10 MG PO CP24
10.0000 mg | ORAL_CAPSULE | Freq: Every day | ORAL | 0 refills | Status: DC
Start: 2023-03-12 — End: 2023-04-09
  Filled 2023-03-12: qty 30, 30d supply, fill #0

## 2023-04-09 ENCOUNTER — Other Ambulatory Visit: Payer: Self-pay | Admitting: Family

## 2023-04-09 ENCOUNTER — Other Ambulatory Visit (HOSPITAL_BASED_OUTPATIENT_CLINIC_OR_DEPARTMENT_OTHER): Payer: Self-pay

## 2023-04-09 MED ORDER — AMPHETAMINE-DEXTROAMPHET ER 10 MG PO CP24
10.0000 mg | ORAL_CAPSULE | Freq: Every day | ORAL | 0 refills | Status: DC
  Filled 2023-04-09 – 2023-04-16 (×2): qty 30, 30d supply, fill #0

## 2023-04-09 MED ORDER — AMPHETAMINE-DEXTROAMPHETAMINE 5 MG PO TABS
5.0000 mg | ORAL_TABLET | Freq: Every day | ORAL | 0 refills | Status: DC
  Filled 2023-04-09 – 2023-04-16 (×2): qty 30, 30d supply, fill #0

## 2023-04-09 NOTE — Telephone Encounter (Signed)
Requesting: Adderall XR 10mg  and Adderall 5mg   Contract: 02/18/23 UDS: 02/18/23 Last Visit: 02/18/23 Next Visit: 08/19/23 Last Refill: 03/12/23 #30 and 0RF (x2)  Please Advise

## 2023-04-16 ENCOUNTER — Other Ambulatory Visit: Payer: Self-pay

## 2023-04-16 ENCOUNTER — Other Ambulatory Visit: Payer: Self-pay | Admitting: Family

## 2023-04-16 ENCOUNTER — Other Ambulatory Visit (HOSPITAL_BASED_OUTPATIENT_CLINIC_OR_DEPARTMENT_OTHER): Payer: Self-pay

## 2023-05-01 ENCOUNTER — Encounter: Payer: Self-pay | Admitting: Family

## 2023-05-03 NOTE — Telephone Encounter (Signed)
Patient scheduled for 06/09/2023

## 2023-05-10 ENCOUNTER — Ambulatory Visit: Payer: BC Managed Care – PPO | Admitting: Family

## 2023-05-13 ENCOUNTER — Ambulatory Visit
Admission: RE | Admit: 2023-05-13 | Discharge: 2023-05-13 | Disposition: A | Discharge status: Home / Self Care | Payer: BC Managed Care – PPO | Source: Ambulatory Visit | Attending: Family Medicine | Admitting: Family Medicine

## 2023-05-13 VITALS — BP 130/66 | HR 65 | Temp 98.4°F | Resp 16 | Ht 72.0 in | Wt 200.0 lb

## 2023-05-13 DIAGNOSIS — R6889 Other general symptoms and signs: Secondary | ICD-10-CM | POA: Diagnosis not present

## 2023-05-13 DIAGNOSIS — R197 Diarrhea, unspecified: Secondary | ICD-10-CM

## 2023-05-13 LAB — POCT INFLUENZA A/B
Influenza A, POC: NEGATIVE
Influenza B, POC: NEGATIVE

## 2023-05-13 LAB — POC SARS CORONAVIRUS 2 AG -  ED: SARS Coronavirus 2 Ag: NEGATIVE

## 2023-05-13 MED ORDER — AZITHROMYCIN 250 MG PO TABS: ORAL_TABLET | ORAL | 0 refills | Status: DC

## 2023-05-13 MED ORDER — DIPHENOXYLATE-ATROPINE 2.5-0.025 MG PO TABS: 1.0000 | ORAL_TABLET | Freq: Four times a day (QID) | ORAL | 0 refills | Status: DC | PRN

## 2023-05-13 NOTE — ED Provider Notes (Signed)
[facility]    CSN: [id number] Arrival date & time: 05/13/23  0955      History   Chief Complaint Chief Complaint  Patient presents with   Diarrhea    Appetite normal, no vomiting or nausea, diarrhea since Friday evening, fever and chills started Saturday afternoon along with muscle aches. Extremely lightheaded for much of Sunday. 102.2 fever on Sunday evening. - Entered by patient    HPI John Faulkner is a 37 y.o. male.   HPI  Patient states he has had fever to 102, fever and chills, decreased appetite, weakness and dizziness for the last 2 days.  He is unable to work.  No known exposure to illness no blood or mucus in the stool.  No recent travel.  No recent antibiotics.  No history of colon problems in the past  Past Medical History:  Diagnosis Date   Acute calculous cholecystitis 08/13/2011   ADHD (attention deficit hyperactivity disorder)    takes aderall .02/05/2022   Anal fissure    Anxiety    Depression    History of hypertension    none since weight loss 2 yrs ago per pt on 02-06-2022   History of sleep apnea    none since weight loss 1 and 1/2 yrs ago   Hypogonadism in male    Nephrotic syndrome 51   age 40 yrs old resolved    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   Depression with anxiety 02/20/2021   Overweight (BMI 25.0-29.9) 08/01/2018   ADHD (attention deficit hyperactivity disorder) 08/01/2018   Hypertension 08/13/2011    Past Surgical History:  Procedure Laterality Date   abdominoplasty     ABDOMINOPLASTY  02/2022   CARPAL TUNNEL  RELEASE Left 2021   CHOLECYSTECTOMY  08/13/2011   Procedure: LAPAROSCOPIC CHOLECYSTECTOMY WITH INTRAOPERATIVE CHOLANGIOGRAM;  Surgeon: Rulon Abide, DO;  Location: [facility] OR;  Service: General;  Laterality: N/A;      circumferential abdominoplasty     Sept 2023   EVALUATION UNDER ANESTHESIA WITH HEMORRHOIDECTOMY N/A 02/16/2022   Procedure: EXAM UNDER ANESTHESIA WITH HEMORRHOIDECTOMY;  Surgeon: Karie Soda, MD;  Location: [facility] [place];  Service: General;  Laterality: N/A;   LAPAROSCOPIC GASTRIC SLEEVE RESECTION N/A 10/10/2020   Procedure: LAPAROSCOPIC GASTRIC SLEEVE RESECTION;  Surgeon: Berna Bue, MD;  Location: [facility];  Service: General;  Laterality: N/A;   MANDIBLE OSTEOTOMY N/A 06/10/2021   Procedure: MANDIBULAR OSTEOTOMY;  Surgeon: Exie Parody, DMD;  Location: [facility] OR;  Service: Oral Surgery;  Laterality: N/A;   MAXILLA OSTEOTOMY N/A 06/10/2021   Procedure: LEFORT 1 MAXILLARY OSTEOTOMY;  Surgeon: Exie Parody, DMD;  Location: [facility] OR;  Service: Oral Surgery;  Laterality: N/A;   SPHINCTEROTOMY N/A 02/16/2022   Procedure: INTERNAL SPHINCTEROTOMY;  Surgeon: Karie Soda, MD;  Location[facility];  Service: General;  Laterality: N/A;   TONSILLECTOMY  06/26/1999   UPPER GI ENDOSCOPY N/A 10/10/2020   Procedure: UPPER GI ENDOSCOPY;  Surgeon: Berna Bue, MD;  Location: [facility];  Service: General;  Laterality: N/A;       Home  Medications    Prior to Admission medications   Medication Sig Start Date End Date Taking? Authorizing Provider  amphetamine-dextroamphetamine (ADDERALL XR) 10 MG 24 hr capsule Take 1 capsule (10 mg total) by mouth daily. 04/09/23  Yes Sandford Craze, NP  amphetamine-dextroamphetamine (ADDERALL) 5 MG tablet Take 1 tablet (5 mg total) by mouth daily in the afternoon. 04/09/23  Yes Sandford Craze, NP  azithromycin (ZITHROMAX Z-PAK) 250 MG tablet Take two pills (500 mg) once a day for 3 days 05/13/23  Yes  Eustace Moore, MD  diphenoxylate-atropine (LOMOTIL) 2.5-0.025 MG tablet Take 1 tablet by mouth 4 (four) times daily as needed for diarrhea or loose stools. 05/13/23  Yes Eustace Moore, MD  escitalopram (LEXAPRO) 10 MG tablet TAKE 1 TABLET DAILY 04/16/23  Yes Sandford Craze, NP  clomiPHENE (CLOMID) 50 MG tablet Take 0.5 tablets (25 mg total) by mouth daily. 01/29/22   Sandford Craze, NP  OVER THE COUNTER MEDICATION Take 1 capsule by mouth 2 (two) times a week. DIM Tuesday and saturday    [provider]    Family History Family History  Problem Relation Age of Onset   Pneumonia Mother        covid 11 pneumonia   COPD Father    Diabetes Father    Heart disease Father 70   Stroke Father    Colon polyps Father        in his late 53s   Diabetes Maternal Grandfather    Diabetes Paternal Grandfather    Colon cancer Neg Hx    Stomach cancer Neg Hx    Esophageal cancer Neg Hx    Pancreatic cancer Neg Hx     Social History Social History   Tobacco Use   Smoking status: Never   Smokeless tobacco: Never  Vaping Use   Vaping status: Never Used  Substance Use Topics   Alcohol use: No   Drug use: No     Allergies   Patient has no known allergies.   Review of Systems Review of Systems See HPI  Physical Exam Triage Vital Signs ED Triage Vitals  Encounter Vitals Group     BP 05/13/23 1013 130/66     Systolic BP Percentile --      Diastolic BP Percentile --      Pulse Rate 05/13/23 1013 65     Resp 05/13/23 1013 16     Temp 05/13/23 1013 98.4 F (36.9 C)     Temp Source 05/13/23 1013 Oral     SpO2 05/13/23 1013 98 %     Weight 05/13/23 1015 200 lb (90.7 kg)     Height 05/13/23 1015 6' (1.829 m)     Head Circumference --      Peak Flow --      Pain Score 05/13/23 1014 3     Pain Loc --      Pain Education --      Exclude from Growth Chart --    No data found.  Updated Vital Signs BP 130/66 (BP Location: Right Arm)   Pulse 65   Temp  98.4 F (36.9 C) (Oral)   Resp 16   Ht 6' (1.829 m)   Wt 90.7 kg   SpO2 98%   BMI 27.12 kg/m       Physical Exam Constitutional:      General: He is not in acute distress.    Appearance: He is well-developed. He is ill-appearing.  HENT:  Head: Normocephalic and atraumatic.     Mouth/Throat:     Mouth: Mucous membranes are moist.  Eyes:     Conjunctiva/sclera: Conjunctivae normal.     Pupils: Pupils are equal, round, and reactive to light.  Cardiovascular:     Rate and Rhythm: Normal rate and regular rhythm.     Heart sounds: Normal heart sounds.  Pulmonary:     Effort: Pulmonary effort is normal. No respiratory distress.     Breath sounds: Normal breath sounds.  Abdominal:     General: There is no distension.     Palpations: Abdomen is soft.     Tenderness: There is no abdominal tenderness.     Comments: Active bowel sounds.  No tenderness or mass  Musculoskeletal:        General: Normal range of motion.     Cervical back: Normal range of motion.  Skin:    General: Skin is warm and dry.     Comments: Skin is not tenting  Neurological:     Mental Status: He is alert.      UC Treatments / Results  Labs (all labs ordered are listed, but only abnormal results are displayed) Labs Reviewed  POC SARS CORONAVIRUS 2 AG -  ED  POCT INFLUENZA A/B    EKG   Radiology No results found.  Procedures Procedures (including critical care time)  Medications Ordered in UC Medications - No data to display  Initial Impression / Assessment and Plan / UC Course  I have reviewed the triage vital signs and the nursing notes.  Pertinent labs & imaging results that were available during my care of the patient were reviewed by me and considered in my medical decision making (see chart for details).     Flu testing is negative.  Concern for acute frequent watery diarrhea in patient who has temperature to 102, and is sexually active with men.  He does have a higher risk of  a bacterial infection.  Will treat with azithromycin and follow Final Clinical Impressions(s) / UC Diagnoses   Final diagnoses:  Diarrhea of presumed infectious origin  Flu-like symptoms     Discharge Instructions      Make sure that you are drinking lots of replacement fluids.  Electrolyte drinks are desirable (low sugar).  You may start a bland diet  Take azithromycin 500 mg daily for 3 days  Take a probiotic until symptoms have improved  May take Lomotil to reduce diarrhea.      ED Prescriptions     Medication Sig Dispense Auth. Provider   azithromycin (ZITHROMAX Z-PAK) 250 MG tablet Take two pills (500 mg) once a day for 3 days 6 tablet Eustace Moore, MD   diphenoxylate-atropine (LOMOTIL) 2.5-0.025 MG tablet Take 1 tablet by mouth 4 (four) times daily as needed for diarrhea or loose stools. 30 tablet Eustace Moore, MD      I have reviewed the PDMP during this encounter.   Eustace Moore, MD 05/13/23 1249

## 2023-05-13 NOTE — ED Triage Notes (Signed)
Patient c/o diarrhea x 2 days, dizziness, bodyaches, fever x 2 days, denies any nausea and vomiting, taken Tylenol for fever.  Appetite normal.  Headache  Continued diarrhea.  Unsure if it's something he ate.

## 2023-05-13 NOTE — Discharge Instructions (Addendum)
Make sure that you are drinking lots of replacement fluids.  Electrolyte drinks are desirable (low sugar).  You may start a bland diet  Take azithromycin 500 mg daily for 3 days  Take a probiotic until symptoms have improved  May take Lomotil to reduce diarrhea.

## 2023-05-15 DIAGNOSIS — Z79890 Hormone replacement therapy (postmenopausal): Secondary | ICD-10-CM | POA: Diagnosis not present

## 2023-05-15 DIAGNOSIS — E291 Testicular hypofunction: Secondary | ICD-10-CM | POA: Diagnosis not present

## 2023-05-15 DIAGNOSIS — R5383 Other fatigue: Secondary | ICD-10-CM | POA: Diagnosis not present

## 2023-05-17 DIAGNOSIS — R6882 Decreased libido: Secondary | ICD-10-CM | POA: Diagnosis not present

## 2023-05-17 DIAGNOSIS — R5383 Other fatigue: Secondary | ICD-10-CM | POA: Diagnosis not present

## 2023-05-17 DIAGNOSIS — E291 Testicular hypofunction: Secondary | ICD-10-CM | POA: Diagnosis not present

## 2023-05-17 DIAGNOSIS — M6281 Muscle weakness (generalized): Secondary | ICD-10-CM | POA: Diagnosis not present

## 2023-05-21 ENCOUNTER — Other Ambulatory Visit: Payer: Self-pay | Admitting: Family

## 2023-05-21 MED ORDER — AMPHETAMINE-DEXTROAMPHETAMINE 5 MG PO TABS
5.0000 mg | ORAL_TABLET | Freq: Every day | ORAL | 0 refills | Status: DC
  Filled 2023-05-21: qty 30, 30d supply, fill #0

## 2023-05-21 MED ORDER — AMPHETAMINE-DEXTROAMPHET ER 10 MG PO CP24
10.0000 mg | ORAL_CAPSULE | Freq: Every day | ORAL | 0 refills | Status: DC
  Filled 2023-05-21: qty 30, 30d supply, fill #0

## 2023-05-21 NOTE — Telephone Encounter (Signed)
Requesting: Adderall XR 10mg  and Adderall 5mg   Contract:  02/18/23 UDS: 02/18/23 Last Visit: 02/18/23 Next Visit: 08/19/23 Last Refill: See med list, multiple controlled substances requested   Please Advise

## 2023-05-22 ENCOUNTER — Other Ambulatory Visit (HOSPITAL_BASED_OUTPATIENT_CLINIC_OR_DEPARTMENT_OTHER): Payer: Self-pay

## 2023-06-17 DIAGNOSIS — Z79890 Hormone replacement therapy (postmenopausal): Secondary | ICD-10-CM | POA: Diagnosis not present

## 2023-06-17 DIAGNOSIS — R5383 Other fatigue: Secondary | ICD-10-CM | POA: Diagnosis not present

## 2023-06-17 DIAGNOSIS — E291 Testicular hypofunction: Secondary | ICD-10-CM | POA: Diagnosis not present

## 2023-06-20 ENCOUNTER — Other Ambulatory Visit (HOSPITAL_BASED_OUTPATIENT_CLINIC_OR_DEPARTMENT_OTHER): Payer: Self-pay

## 2023-06-20 ENCOUNTER — Other Ambulatory Visit: Payer: Self-pay | Admitting: Family

## 2023-06-20 MED ORDER — AMPHETAMINE-DEXTROAMPHETAMINE 5 MG PO TABS
5.0000 mg | ORAL_TABLET | Freq: Every day | ORAL | 0 refills | Status: DC
  Filled 2023-06-20: qty 30, 30d supply, fill #0

## 2023-06-20 MED ORDER — AMPHETAMINE-DEXTROAMPHET ER 10 MG PO CP24
10.0000 mg | ORAL_CAPSULE | Freq: Every day | ORAL | 0 refills | Status: DC
  Filled 2023-06-20: qty 30, 30d supply, fill #0

## 2023-06-21 DIAGNOSIS — R5383 Other fatigue: Secondary | ICD-10-CM | POA: Diagnosis not present

## 2023-06-21 DIAGNOSIS — Z6826 Body mass index (BMI) 26.0-26.9, adult: Secondary | ICD-10-CM | POA: Diagnosis not present

## 2023-06-21 DIAGNOSIS — M6281 Muscle weakness (generalized): Secondary | ICD-10-CM | POA: Diagnosis not present

## 2023-06-21 DIAGNOSIS — E291 Testicular hypofunction: Secondary | ICD-10-CM | POA: Diagnosis not present

## 2023-07-19 ENCOUNTER — Other Ambulatory Visit: Payer: Self-pay | Admitting: Family

## 2023-07-23 ENCOUNTER — Other Ambulatory Visit (HOSPITAL_BASED_OUTPATIENT_CLINIC_OR_DEPARTMENT_OTHER): Payer: Self-pay

## 2023-07-23 ENCOUNTER — Other Ambulatory Visit: Payer: Self-pay

## 2023-07-23 ENCOUNTER — Encounter: Payer: Self-pay | Admitting: Family

## 2023-07-23 MED ORDER — AMPHETAMINE-DEXTROAMPHET ER 10 MG PO CP24
10.0000 mg | ORAL_CAPSULE | Freq: Every day | ORAL | 0 refills | Status: DC
  Filled 2023-07-23: qty 30, 30d supply, fill #0

## 2023-07-23 MED ORDER — AMPHETAMINE-DEXTROAMPHETAMINE 5 MG PO TABS
5.0000 mg | ORAL_TABLET | Freq: Every day | ORAL | 0 refills | Status: DC
  Filled 2023-07-23: qty 30, 30d supply, fill #0

## 2023-07-23 NOTE — Telephone Encounter (Signed)
Requesting: Adderall XR 10mg  and Adderall 5mg   Contract: 02/18/23 UDS: 02/18/23 Last Visit: 02/18/23 Next Visit: 08/19/23 Last Refill:see med list   Please Advise

## 2023-08-07 DIAGNOSIS — Z79890 Hormone replacement therapy (postmenopausal): Secondary | ICD-10-CM | POA: Diagnosis not present

## 2023-08-07 DIAGNOSIS — R5383 Other fatigue: Secondary | ICD-10-CM | POA: Diagnosis not present

## 2023-08-07 DIAGNOSIS — E291 Testicular hypofunction: Secondary | ICD-10-CM | POA: Diagnosis not present

## 2023-08-09 DIAGNOSIS — E291 Testicular hypofunction: Secondary | ICD-10-CM | POA: Diagnosis not present

## 2023-08-09 DIAGNOSIS — R5383 Other fatigue: Secondary | ICD-10-CM | POA: Diagnosis not present

## 2023-08-09 DIAGNOSIS — G479 Sleep disorder, unspecified: Secondary | ICD-10-CM | POA: Diagnosis not present

## 2023-08-19 ENCOUNTER — Encounter: Payer: BC Managed Care – PPO | Admitting: Family

## 2023-08-21 ENCOUNTER — Other Ambulatory Visit: Payer: Self-pay | Admitting: Family

## 2023-08-21 MED ORDER — AMPHETAMINE-DEXTROAMPHETAMINE 5 MG PO TABS
5.0000 mg | ORAL_TABLET | Freq: Every day | ORAL | 0 refills | Status: DC
  Filled 2023-08-21: qty 30, 30d supply, fill #0

## 2023-08-21 MED ORDER — AMPHETAMINE-DEXTROAMPHET ER 10 MG PO CP24
10.0000 mg | ORAL_CAPSULE | Freq: Every day | ORAL | 0 refills | Status: DC
  Filled 2023-08-21: qty 30, 30d supply, fill #0

## 2023-08-22 ENCOUNTER — Other Ambulatory Visit (HOSPITAL_BASED_OUTPATIENT_CLINIC_OR_DEPARTMENT_OTHER): Payer: Self-pay

## 2023-08-22 ENCOUNTER — Other Ambulatory Visit: Payer: Self-pay

## 2023-09-04 DIAGNOSIS — F1020 Alcohol dependence, uncomplicated: Secondary | ICD-10-CM | POA: Diagnosis not present

## 2023-09-04 DIAGNOSIS — I498 Other specified cardiac arrhythmias: Secondary | ICD-10-CM | POA: Diagnosis not present

## 2023-09-04 DIAGNOSIS — F1910 Other psychoactive substance abuse, uncomplicated: Secondary | ICD-10-CM | POA: Diagnosis not present

## 2023-09-04 DIAGNOSIS — R451 Restlessness and agitation: Secondary | ICD-10-CM | POA: Diagnosis not present

## 2023-09-04 DIAGNOSIS — Z206 Contact with and (suspected) exposure to human immunodeficiency virus [HIV]: Secondary | ICD-10-CM | POA: Diagnosis not present

## 2023-09-04 DIAGNOSIS — Z635 Disruption of family by separation and divorce: Secondary | ICD-10-CM | POA: Diagnosis not present

## 2023-09-04 DIAGNOSIS — R45851 Suicidal ideations: Secondary | ICD-10-CM | POA: Diagnosis not present

## 2023-09-04 DIAGNOSIS — Z634 Disappearance and death of family member: Secondary | ICD-10-CM | POA: Diagnosis not present

## 2023-09-04 DIAGNOSIS — Z79899 Other long term (current) drug therapy: Secondary | ICD-10-CM | POA: Diagnosis not present

## 2023-09-04 DIAGNOSIS — R634 Abnormal weight loss: Secondary | ICD-10-CM | POA: Diagnosis not present

## 2023-09-04 DIAGNOSIS — R454 Irritability and anger: Secondary | ICD-10-CM | POA: Diagnosis not present

## 2023-09-04 DIAGNOSIS — F10129 Alcohol abuse with intoxication, unspecified: Secondary | ICD-10-CM | POA: Diagnosis not present

## 2023-09-04 DIAGNOSIS — R4587 Impulsiveness: Secondary | ICD-10-CM | POA: Diagnosis not present

## 2023-09-04 DIAGNOSIS — R000 Tachycardia, unspecified: Secondary | ICD-10-CM | POA: Diagnosis not present

## 2023-09-04 DIAGNOSIS — Z6379 Other stressful life events affecting family and household: Secondary | ICD-10-CM | POA: Diagnosis not present

## 2023-09-04 DIAGNOSIS — Z818 Family history of other mental and behavioral disorders: Secondary | ICD-10-CM | POA: Diagnosis not present

## 2023-09-04 DIAGNOSIS — F10239 Alcohol dependence with withdrawal, unspecified: Secondary | ICD-10-CM | POA: Diagnosis not present

## 2023-09-04 DIAGNOSIS — Z630 Problems in relationship with spouse or partner: Secondary | ICD-10-CM | POA: Diagnosis not present

## 2023-09-04 DIAGNOSIS — F419 Anxiety disorder, unspecified: Secondary | ICD-10-CM | POA: Diagnosis not present

## 2023-09-04 DIAGNOSIS — Z62820 Parent-biological child conflict: Secondary | ICD-10-CM | POA: Diagnosis not present

## 2023-09-04 DIAGNOSIS — G4700 Insomnia, unspecified: Secondary | ICD-10-CM | POA: Diagnosis not present

## 2023-09-04 DIAGNOSIS — F909 Attention-deficit hyperactivity disorder, unspecified type: Secondary | ICD-10-CM | POA: Diagnosis not present

## 2023-09-04 DIAGNOSIS — Z6826 Body mass index (BMI) 26.0-26.9, adult: Secondary | ICD-10-CM | POA: Diagnosis not present

## 2023-09-04 DIAGNOSIS — F332 Major depressive disorder, recurrent severe without psychotic features: Secondary | ICD-10-CM | POA: Diagnosis not present

## 2023-09-04 DIAGNOSIS — R41843 Psychomotor deficit: Secondary | ICD-10-CM | POA: Diagnosis not present

## 2023-09-04 DIAGNOSIS — F10229 Alcohol dependence with intoxication, unspecified: Secondary | ICD-10-CM | POA: Diagnosis not present

## 2023-09-10 ENCOUNTER — Ambulatory Visit: Admitting: Family Medicine

## 2023-09-10 VITALS — BP 118/88 | HR 65 | Temp 97.8°F | Resp 18 | Ht 72.0 in | Wt 195.4 lb

## 2023-09-10 DIAGNOSIS — F418 Other specified anxiety disorders: Secondary | ICD-10-CM | POA: Diagnosis not present

## 2023-09-10 MED ORDER — ESCITALOPRAM OXALATE 20 MG PO TABS: 20.0000 mg | ORAL_TABLET | Freq: Every day | ORAL | 3 refills | Status: AC

## 2023-09-10 MED ORDER — ESCITALOPRAM OXALATE 20 MG PO TABS
20.0000 mg | ORAL_TABLET | Freq: Every day | ORAL | 3 refills | Status: DC
Start: 2023-09-10 — End: 2023-09-10

## 2023-09-10 NOTE — Patient Instructions (Signed)

## 2023-09-10 NOTE — Progress Notes (Signed)
 Established Patient Office Visit  Subjective   Patient ID: John Faulkner, male    DOB: 1986-02-22  Age: 38 y.o. MRN: [id number]  Chief Complaint  Patient presents with   Hospitalization Follow-up    Pt states needing letter for work stating he is okay to work. Pt states feel likes he's okay to return     HPI Discussed the use of AI scribe software for clinical note transcription with the patient, who gave verbal consent to proceed.  History of Present Illness   John Faulkner "[name]" is a 38 year old male who presents for follow-up after recent hospitalization for alcohol detoxification.  He was hospitalized for alcohol detoxification from Thursday until yesterday after being found blacked out at home with a blood alcohol level of 0.1248. He had bruises on his back and black eyes from falling. During the incident, he became combative and was sedated with ketamine. He does not recall making suicidal statements but acknowledges having fleeting thoughts of depression. He attributes these events to alcohol consumption, which began after his separation from his wife last year. He has stopped drinking and disposed of all alcohol, feeling disgusted at the thought of it. He feels back to himself and is eager to return to work, though his energy levels are still recovering from his hospital stay.  He has been prescribed Lexapro for depression, which he states has been effective until his recent increase in alcohol use. He is currently taking Lexapro 10 mg and Adderall. He has an appointment scheduled with an online psychiatric service, [facility], on April 2nd.  He describes a significant change in his life circumstances, including coming out as gay, which led to his separation from his wife. He began drinking alcohol for the first time at the age of 85, which he attributes to loneliness and the influence of a new relationship. He has since ended that relationship and is now in a supportive  relationship with a new partner who is against alcohol use.  He lives alone but has his children three days a week. His children, aged 27 and 46, have adjusted well to the new family dynamics with the help of their grandmother, a child psychiatrist. His ex-wife remains a supportive figure in his life.      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   Depression with anxiety 02/20/2021   Overweight (BMI 25.0-29.9) 08/01/2018   ADHD (attention deficit hyperactivity disorder) 08/01/2018   Hypertension 08/13/2011   Past Medical History:  Diagnosis Date   Acute calculous cholecystitis 08/13/2011   ADHD (attention deficit hyperactivity disorder)    takes aderall .02/05/2022   Anal fissure    Anxiety    Depression    History of hypertension    none since weight loss 2 yrs ago per pt on 02-06-2022   History of sleep apnea    none since weight loss 1 and 1/2 yrs ago   Hypogonadism in male    Nephrotic syndrome 80   age 89 yrs old resolved   Past Surgical History:  Procedure Laterality Date   abdominoplasty  ABDOMINOPLASTY  02/2022   CARPAL TUNNEL RELEASE Left 2021   CHOLECYSTECTOMY  08/13/2011   Procedure: LAPAROSCOPIC CHOLECYSTECTOMY WITH INTRAOPERATIVE CHOLANGIOGRAM;  Surgeon: Rulon Abide, DO;  Location: [facility] OR;  Service: General;  Laterality: N/A;      circumferential abdominoplasty     Sept 2023   EVALUATION UNDER ANESTHESIA WITH HEMORRHOIDECTOMY N/A 02/16/2022   Procedure: EXAM UNDER ANESTHESIA WITH HEMORRHOIDECTOMY;  Surgeon: Karie Soda, MD;  Location: [facility];   Service: General;  Laterality: N/A;   LAPAROSCOPIC GASTRIC SLEEVE RESECTION N/A 10/10/2020   Procedure: LAPAROSCOPIC GASTRIC SLEEVE RESECTION;  Surgeon: Berna Bue, MD;  Location: [facility] ORS;  Service: General;  Laterality: N/A;   MANDIBLE OSTEOTOMY N/A 06/10/2021   Procedure: MANDIBULAR OSTEOTOMY;  Surgeon: Exie Parody, DMD;  Location: [facility] OR;  Service: Oral Surgery;  Laterality: N/A;   MAXILLA OSTEOTOMY N/A 06/10/2021   Procedure: LEFORT 1 MAXILLARY OSTEOTOMY;  Surgeon: Exie Parody, DMD;  Location: [facility] OR;  Service: Oral Surgery;  Laterality: N/A;   SPHINCTEROTOMY N/A 02/16/2022   Procedure: INTERNAL SPHINCTEROTOMY;  Surgeon: Karie Soda, MD;  Location: [facility];  Service: General;  Laterality: N/A;   TONSILLECTOMY  06/26/1999   UPPER GI ENDOSCOPY N/A 10/10/2020   Procedure: UPPER GI ENDOSCOPY;  Surgeon: Berna Bue, MD;  Location: [facility];  Service: General;  Laterality: N/A;   Social History   Tobacco Use   Smoking status: Never   Smokeless tobacco: Never  Vaping Use   Vaping status: Never Used  Substance Use Topics   Alcohol use: No   Drug use: No   Social History   Socioeconomic History   Marital status: Legally Separated    Spouse name: Not on file   Number of children: Not on file   Years of education: Not on file   Highest education level: Bachelor's degree (e.g., BA, AB, BS)  Occupational History   Not on file  Tobacco Use   Smoking status: Never   Smokeless tobacco: Never  Vaping Use   Vaping status: Never Used  Substance and Sexual Activity   Alcohol use: No   Drug use: No   Sexual activity: Not on file  Other Topics Concern   Not on file  Social History Narrative   Work or School:       Home Situation: lives with wife, 2 children   Daughter 2014   Son 2018      Has a cat a Nurse, mental health      Spiritual Beliefs: none      Lifestyle: no regular exercise; poor      Works for a Tax inspector      Social  Drivers of Corporate investment banker Strain: Low Risk  (09/10/2023)   Overall Financial Resource Strain (CARDIA)    Difficulty of Paying Living Expenses: Not hard at all  Food Insecurity: No Food Insecurity (09/10/2023)   Hunger Vital Sign    Worried About Running Out of Food in the Last Year: Never true    Ran Out of Food in the Last Year: Never true  Transportation Needs: No Transportation Needs (09/10/2023)   PRAPARE - Administrator, Civil Service (Medical): No    Lack of Transportation (Non-Medical): No  Physical Activity: Unknown (09/10/2023)   Exercise Vital Sign    Days of Exercise per Week: 0 days    Minutes of Exercise per Session: Not on file  Stress: No Stress Concern  Present (09/10/2023)   [employer] of Occupational Health - Occupational Stress Questionnaire    Feeling of Stress : Only a little  Social Connections: Socially Isolated (09/10/2023)   Social Connection and Isolation Panel [NHANES]    Frequency of Communication with Friends and Family: More than three times a week    Frequency of Social Gatherings with Friends and Family: More than three times a week    Attends Religious Services: Never    Database administrator or Organizations: No    Attends Engineer, structural: Not on file    Marital Status: Separated  Intimate Partner Violence: Not At Risk (09/05/2023)   Received from [facility]   HITS    Over the last 12 months how often did your partner physically hurt you?: Never    Over the last 12 months how often did your partner insult you or talk down to you?: Never    Over the last 12 months how often did your partner threaten you with physical harm?: Never    Over the last 12 months how often did your partner scream or curse at you?: Never   Family Status  Relation Name Status   Mother  Deceased   Father  Deceased   MGF  (Not Specified)   PGF  (Not Specified)   Neg Hx  (Not Specified)  No partnership data on file   Family  History  Problem Relation Age of Onset   Pneumonia Mother        covid 79 pneumonia   COPD Father    Diabetes Father    Heart disease Father 58   Stroke Father    Colon polyps Father        in his late 14s   Diabetes Maternal Grandfather    Diabetes Paternal Grandfather    Colon cancer Neg Hx    Stomach cancer Neg Hx    Esophageal cancer Neg Hx    Pancreatic cancer Neg Hx    No Known Allergies    Review of Systems  Constitutional:  Negative for chills, fever and malaise/fatigue.  HENT:  Negative for congestion and hearing loss.   Eyes:  Negative for blurred vision and discharge.  Respiratory:  Negative for cough, sputum production and shortness of breath.   Cardiovascular:  Negative for chest pain, palpitations and leg swelling.  Gastrointestinal:  Negative for abdominal pain, blood in stool, constipation, diarrhea, heartburn, nausea and vomiting.  Genitourinary:  Negative for dysuria, frequency, hematuria and urgency.  Musculoskeletal:  Negative for back pain, falls and myalgias.  Skin:  Negative for rash.  Neurological:  Negative for dizziness, sensory change, loss of consciousness, weakness and headaches.  Endo/Heme/Allergies:  Negative for environmental allergies. Does not bruise/bleed easily.  Psychiatric/Behavioral:  Positive for depression. Negative for hallucinations, substance abuse and suicidal ideas. The patient is nervous/anxious. The patient does not have insomnia.        Pt has quit drinking alcohol      Objective:     BP 118/88 (BP Location: Left Arm, Patient Position: Sitting, Cuff Size: Normal)   Pulse 65   Temp 97.8 F (36.6 C) (Oral)   Resp 18   Ht 6' (1.829 m)   Wt 195 lb 6.4 oz (88.6 kg)   SpO2 96%   BMI 26.50 kg/m  BP Readings from Last 3 Encounters:  09/10/23 118/88  05/13/23 130/66  02/18/23 136/76   Wt Readings from Last 3 Encounters:  09/10/23 195 lb 6.4 oz (  88.6 kg)  05/13/23 200 lb (90.7 kg)  02/18/23 201 lb (91.2 kg)   SpO2  Readings from Last 3 Encounters:  09/10/23 96%  05/13/23 98%  02/18/23 100%      Physical Exam Vitals and nursing note reviewed.  Constitutional:      General: He is not in acute distress.    Appearance: Normal appearance. He is well-developed.  HENT:     Head: Normocephalic and atraumatic.  Eyes:     General: No scleral icterus.       Right eye: No discharge.        Left eye: No discharge.  Cardiovascular:     Rate and Rhythm: Normal rate and regular rhythm.     Heart sounds: No murmur heard. Pulmonary:     Effort: Pulmonary effort is normal. No respiratory distress.     Breath sounds: Normal breath sounds.  Musculoskeletal:        General: Normal range of motion.     Cervical back: Normal range of motion and neck supple.     Right lower leg: No edema.     Left lower leg: No edema.  Skin:    General: Skin is warm and dry.  Neurological:     Mental Status: He is alert and oriented to person, place, and time.  Psychiatric:        Mood and Affect: Mood is anxious and depressed. Affect is flat.        Behavior: Behavior is slowed. Behavior is not withdrawn.        Thought Content: Thought content normal.        Judgment: Judgment normal.      No results found for any visits on 09/10/23.  Last CBC Lab Results  Component Value Date   WBC 5.6 08/06/2022   HGB 15.8 08/06/2022   HCT 47.4 08/06/2022   MCV 91.5 08/06/2022   MCH 31.4 06/11/2021   RDW 13.9 08/06/2022   PLT 155.0 08/06/2022   Last metabolic panel Lab Results  Component Value Date   GLUCOSE 81 08/06/2022   NA 140 08/06/2022   K 5.1 08/06/2022   CL 104 08/06/2022   CO2 28 08/06/2022   BUN 14 08/06/2022   CREATININE 1.00 08/06/2022   GFR 96.78 08/06/2022   CALCIUM 9.9 08/06/2022   PROT 6.8 08/06/2022   ALBUMIN 4.6 08/06/2022   BILITOT 0.6 08/06/2022   ALKPHOS 61 08/06/2022   AST 19 08/06/2022   ALT 14 08/06/2022   ANIONGAP 6 06/11/2021   Last lipids Lab Results  Component Value Date    CHOL 159 08/06/2022   HDL 50.00 08/06/2022   LDLCALC 96 08/06/2022   TRIG 65.0 08/06/2022   CHOLHDL 3 08/06/2022   Last hemoglobin A1c Lab Results  Component Value Date   HGBA1C 5.4 05/05/2018   Last thyroid functions Lab Results  Component Value Date   TSH 1.08 08/06/2022   Last vitamin D No results found for: "25OHVITD2", "25OHVITD3", "VD25OH" Last vitamin B12 and Folate Lab Results  Component Value Date   VITAMINB12 423 06/30/2021      The ASCVD Risk score (Arnett DK, et al., 2019) failed to calculate for the following reasons:   The 2019 ASCVD risk score is only valid for ages 53 to 49    Assessment & Plan:   Problem List Items Addressed This Visit       Unprioritized   Depression with anxiety - Primary   Relevant Medications   escitalopram (LEXAPRO)  20 MG tablet  Assessment and Plan    Alcohol Use Disorder   Alcohol use disorder has caused significant personal and social issues, including a recent hospitalization for detoxification. He reported a blood alcohol level of 0.1248 and experienced a blackout requiring hospital care. Alcohol use began as a coping mechanism following separation from his wife. He has stopped drinking and expresses a strong aversion to alcohol, showing a commitment to sobriety. His recovery is supported by a network including his ex-wife and current partner. Continue abstinence from alcohol. Attend outpatient psychiatric follow-up on April 2nd with [facility]. Utilize support system including ex-wife and current partner.  Depression   Depression is managed with Lexapro (escitalopram) 10 mg. Recent life changes, such as separation from his wife and coming out as gay, have worsened symptoms. He experiences fleeting suicidal thoughts linked to alcohol use and depression. He is interested in increasing Lexapro to 20 mg for better symptom control. Increase Lexapro to 20 mg daily. Send prescription for Lexapro 20 mg to [facility] pharmacy at [facility]. Monitor for improvement in depressive symptoms. Discuss medication management with primary care provider at the upcoming visit.  General Health Maintenance   He is scheduled for a physical appointment with primary care provider, Sandford Craze, on Friday to review current medications and overall health status. Attend physical appointment with primary care provider on Friday. Discuss any additional health maintenance needs with primary care provider.        Return if symptoms worsen or fail to improve.    Donato Schultz, DO

## 2023-09-13 ENCOUNTER — Ambulatory Visit (INDEPENDENT_AMBULATORY_CARE_PROVIDER_SITE_OTHER): Payer: BC Managed Care – PPO | Admitting: Family

## 2023-09-13 VITALS — BP 139/76 | HR 63 | Temp 97.9°F | Resp 16 | Ht 72.0 in | Wt 194.0 lb

## 2023-09-13 DIAGNOSIS — F418 Other specified anxiety disorders: Secondary | ICD-10-CM

## 2023-09-13 DIAGNOSIS — F909 Attention-deficit hyperactivity disorder, unspecified type: Secondary | ICD-10-CM

## 2023-09-13 DIAGNOSIS — F1090 Alcohol use, unspecified, uncomplicated: Secondary | ICD-10-CM | POA: Insufficient documentation

## 2023-09-13 DIAGNOSIS — Z7252 High risk homosexual behavior: Secondary | ICD-10-CM | POA: Insufficient documentation

## 2023-09-13 MED ORDER — DESCOVY 200-25 MG PO TABS: 1.0000 | ORAL_TABLET | Freq: Every day | ORAL | Status: DC

## 2023-09-13 NOTE — Assessment & Plan Note (Signed)
  Abused Adderall 5 mg, effective 10 mg dose in the morning, agreed to discontinue 5 mg. - Discontinue Adderall 5 mg. - Continue Adderall 10 mg in the morning. - Encourage safe disposal of unused medications. -He promises to take the adderal xr 10mg  as prescribed.

## 2023-09-13 NOTE — Assessment & Plan Note (Signed)
>>  ASSESSMENT AND PLAN FOR DEPRESSION WITH ANXIETY WRITTEN ON 09/13/2023 10:10 AM BY O'SULLIVAN, MELISSA, NP  Worsening symptoms led to hospitalization. Lexapro  increased to 20 mg, no suicidal thoughts post-discharge. Counseling crucial for support. - Continue Lexapro  20 mg daily. - Reassess Lexapro  effectiveness in one month. - Encourage pt to establish with a counselor. Gave him info for our behavioral medicine.

## 2023-09-13 NOTE — Assessment & Plan Note (Signed)
 Increased alcohol use post-divorce, recent hospitalization for intoxication and suicidal ideation. Abstinent since discharge, identifies triggers, considering counseling and AA. - Provided information on counseling services. - Encouraged AA meeting attendance. - Discussed alternative coping strategies for triggers.

## 2023-09-13 NOTE — Patient Instructions (Addendum)
 VISIT SUMMARY:  Today, we discussed your recent hospitalization due to alcohol use and suicidal thoughts, and we reviewed your current medications and mental health status. We also talked about your alcohol use disorder, depression, ADHD, and HIV prevention regimen.  YOUR PLAN:  -ALCOHOL USE DISORDER: Alcohol use disorder is a condition where a person has difficulty controlling their drinking despite negative consequences. You have been abstinent since your recent discharge and have identified specific triggers for your cravings. We discussed the importance of counseling and attending AA meetings, and we talked about alternative coping strategies for your triggers.  -MAJOR DEPRESSIVE DISORDER: Major depressive disorder is a mental health condition characterized by persistent feelings of sadness and loss of interest. Your Lexapro dosage has been increased to 20 mg daily, and you have not had suicidal thoughts since your discharge. It is important to continue taking your medication and to engage in counseling for additional support. We will reassess the effectiveness of Lexapro in one month.  -ADHD: Attention-deficit/hyperactivity disorder (ADHD) is a condition that includes symptoms such as inattentiveness, hyperactivity, and impulsiveness. You have been taking Adderall to help manage your symptoms. We agreed to discontinue the 5 mg dose and continue with the 10 mg dose in the morning. Please ensure the safe disposal of any unused medications.  -HIV PRE-EXPOSURE PROPHYLAXIS (PREP): HIV pre-exposure prophylaxis (PrEP) is a preventive treatment for people who do not have HIV but are at high risk of getting it. You have been consistently taking Descovy for the past 3-4 months. We will ensure that this medication is listed in your medication list.  INSTRUCTIONS:  Please follow up in one month to reassess the effectiveness of your Lexapro. Continue attending counseling sessions and consider joining AA  meetings for additional support. Dispose of any unused medications safely.

## 2023-09-13 NOTE — Assessment & Plan Note (Signed)
 Worsening symptoms led to hospitalization. Lexapro increased to 20 mg, no suicidal thoughts post-discharge. Counseling crucial for support. - Continue Lexapro 20 mg daily. - Reassess Lexapro effectiveness in one month. - Encourage pt to establish with a counselor. Gave him info for our behavioral medicine.

## 2023-09-13 NOTE — Assessment & Plan Note (Signed)
 Continues descovy for HIV Prep which is being prescribed by an online provider.

## 2023-09-13 NOTE — Progress Notes (Signed)
 Subjective:     Patient ID: John Faulkner, male    DOB: 03-08-1986, 38 y.o.   MRN: [id number]  Chief Complaint  Patient presents with   Annual Exam    HPI  Discussed the use of AI scribe software for clinical note transcription with the patient, who gave verbal consent to proceed.  History of Present Illness  John Faulkner "John Faulkner" is a 38 year old male with depression and alcohol use disorder who presents with recent involuntary psychiatric admission and medication management.  He recently experienced an involuntary psychiatric admission following an incident involving alcohol consumption and suicidal ideation. Last Wednesday, he was supposed to pick up his children but consumed vodka instead, leading to disorientation and suicidal thoughts, which he expressed to his soon-to-be ex-wife. She found him disoriented and called the police, resulting in his hospital admission. During his stay, he was initially given Lexapro 5 mg due to a misunderstanding about his medication history. He was also started on gabapentin and librium which was tapered during his stay from Thursday to Monday.  He reports a significant increase in stress and alcohol use following his divorce and subsequent relationship issues. He was not a drinker before these events but began drinking with his first boyfriend after the separation. His alcohol use escalated, particularly when alone, and he would drink to 'black out' rather than for euphoria. Vodka and passing [employer] stores are specific triggers for his alcohol cravings. No current cravings for alcohol and no thoughts of self-harm since discharge.   He has a history of depression and has been on Lexapro 10 mg for several years, which was recently increased to 20 mg. He is currently stable with no suicidal thoughts since discharge. He acknowledges past abuse of Adderall, specifically taking multiple 5 mg doses at work to enhance focus, but states he has not abused the 10  mg dose. He requests discontinuation of the 5 mg dose and commits to not abusing the 10 mg dose, which he finds helpful in the morning.  He has been taking Descovy 200/25 mg daily for HIV PrEP for the past three to four months. He obtains this medication through an online provider and has been adherent.  His social support includes a healthy relationship with his current partner and a supportive relationship with his soon-to-be ex-wife, who is aware of his situation and has custody of his unused medications. His children are unaware of his recent hospitalization, believing he had the flu.     There are no preventive care reminders to display for this patient.  Past Medical History:  Diagnosis Date   Acute calculous cholecystitis 08/13/2011   ADHD (attention deficit hyperactivity disorder)    takes aderall .02/05/2022   Anal fissure    Anxiety    Depression    History of hypertension    none since weight loss 2 yrs ago per pt on 02-06-2022   History of sleep apnea    none since weight loss 1 and 1/2 yrs ago   Hypogonadism in male    Nephrotic syndrome 59   age 17 yrs old resolved       Outpatient Medications Prior to Visit  Medication Sig Dispense Refill   amphetamine-dextroamphetamine (ADDERALL XR) 10 MG 24 hr capsule Take 1 capsule (10 mg total) by mouth daily. 30 capsule 0   clomiPHENE (CLOMID) 50 MG tablet Take 0.5 tablets (25 mg total) by mouth daily.     escitalopram (LEXAPRO) 20 MG tablet Take 1  tablet (20 mg total) by mouth daily. 90 tablet 3   gabapentin (NEURONTIN) 300 MG capsule Take by mouth.     amphetamine-dextroamphetamine (ADDERALL) 5 MG tablet Take 1 tablet (5 mg total) by mouth daily in the afternoon. 30 tablet 0   No facility-administered medications prior to visit.    No Known Allergies  ROS     Objective:    Physical Exam Constitutional:      Appearance: Normal appearance.  Cardiovascular:     Rate and Rhythm: Normal rate.  Pulmonary:      Effort: Pulmonary effort is normal.  Neurological:     General: No focal deficit present.     Mental Status: He is alert and oriented to person, place, and time.  Psychiatric:        Mood and Affect: Mood normal.        Behavior: Behavior normal.        Thought Content: Thought content normal.        Judgment: Judgment normal.      BP 139/76 (BP Location: Right Arm, Patient Position: Sitting, Cuff Size: Normal)   Pulse 63   Temp 97.9 F (36.6 C) (Oral)   Resp 16   Ht 6' (1.829 m)   Wt 194 lb (88 kg)   SpO2 100%   BMI 26.31 kg/m  Wt Readings from Last 3 Encounters:  09/13/23 194 lb (88 kg)  09/10/23 195 lb 6.4 oz (88.6 kg)  05/13/23 200 lb (90.7 kg)       Assessment & Plan:   Problem List Items Addressed This Visit       Unprioritized   High risk homosexual behavior - Primary   Continues descovy for HIV Prep which is being prescribed by an online provider.       Relevant Medications   emtricitabine-tenofovir AF (DESCOVY) 200-25 MG tablet   Depression with anxiety   Worsening symptoms led to hospitalization. Lexapro increased to 20 mg, no suicidal thoughts post-discharge. Counseling crucial for support. - Continue Lexapro 20 mg daily. - Reassess Lexapro effectiveness in one month. - Encourage pt to establish with a counselor. Gave him info for our behavioral medicine.        Alcohol use disorder   Increased alcohol use post-divorce, recent hospitalization for intoxication and suicidal ideation. Abstinent since discharge, identifies triggers, considering counseling and AA. - Provided information on counseling services. - Encouraged AA meeting attendance. - Discussed alternative coping strategies for triggers.      ADHD (attention deficit hyperactivity disorder)    Abused Adderall 5 mg, effective 10 mg dose in the morning, agreed to discontinue 5 mg. - Discontinue Adderall 5 mg. - Continue Adderall 10 mg in the morning. - Encourage safe disposal of unused  medications. -He promises to take the adderal xr 10mg  as prescribed.        I am having John Faulkner. John Faulkner "John Faulkner" start on Descovy. I am also having him maintain his Clomid, amphetamine-dextroamphetamine, gabapentin, and escitalopram.  Meds ordered this encounter  Medications   emtricitabine-tenofovir AF (DESCOVY) 200-25 MG tablet    Sig: Take 1 tablet by mouth daily.    Supervising Provider:   Danise Edge A [id number]

## 2023-09-18 ENCOUNTER — Ambulatory Visit: Admitting: Urology

## 2023-09-20 ENCOUNTER — Other Ambulatory Visit: Payer: Self-pay | Admitting: Family

## 2023-09-23 ENCOUNTER — Other Ambulatory Visit (HOSPITAL_BASED_OUTPATIENT_CLINIC_OR_DEPARTMENT_OTHER): Payer: Self-pay

## 2023-09-23 MED ORDER — AMPHETAMINE-DEXTROAMPHET ER 10 MG PO CP24
10.0000 mg | ORAL_CAPSULE | Freq: Every day | ORAL | 0 refills | Status: DC
  Filled 2023-09-23: qty 30, 30d supply, fill #0

## 2023-09-25 ENCOUNTER — Ambulatory Visit: Admitting: Urology

## 2023-09-25 DIAGNOSIS — F331 Major depressive disorder, recurrent, moderate: Secondary | ICD-10-CM | POA: Diagnosis not present

## 2023-09-25 DIAGNOSIS — F902 Attention-deficit hyperactivity disorder, combined type: Secondary | ICD-10-CM | POA: Diagnosis not present

## 2023-09-25 DIAGNOSIS — F411 Generalized anxiety disorder: Secondary | ICD-10-CM | POA: Diagnosis not present

## 2023-09-25 DIAGNOSIS — F4310 Post-traumatic stress disorder, unspecified: Secondary | ICD-10-CM | POA: Diagnosis not present

## 2023-10-07 DIAGNOSIS — F4323 Adjustment disorder with mixed anxiety and depressed mood: Secondary | ICD-10-CM | POA: Diagnosis not present

## 2023-10-07 DIAGNOSIS — F900 Attention-deficit hyperactivity disorder, predominantly inattentive type: Secondary | ICD-10-CM | POA: Diagnosis not present

## 2023-10-07 DIAGNOSIS — F1014 Alcohol abuse with alcohol-induced mood disorder: Secondary | ICD-10-CM | POA: Diagnosis not present

## 2023-10-09 ENCOUNTER — Ambulatory Visit: Admitting: Family

## 2023-11-24 ENCOUNTER — Other Ambulatory Visit: Payer: Self-pay | Admitting: Family

## 2023-11-24 MED ORDER — AMPHETAMINE-DEXTROAMPHET ER 10 MG PO CP24: 10.0000 mg | ORAL_CAPSULE | Freq: Every day | ORAL | 0 refills | Status: DC

## 2023-11-25 DIAGNOSIS — F411 Generalized anxiety disorder: Secondary | ICD-10-CM | POA: Diagnosis not present

## 2023-11-25 DIAGNOSIS — F331 Major depressive disorder, recurrent, moderate: Secondary | ICD-10-CM | POA: Diagnosis not present

## 2023-11-26 ENCOUNTER — Encounter (HOSPITAL_BASED_OUTPATIENT_CLINIC_OR_DEPARTMENT_OTHER): Payer: Self-pay

## 2023-11-27 ENCOUNTER — Other Ambulatory Visit: Payer: Self-pay | Admitting: Family

## 2023-11-27 ENCOUNTER — Other Ambulatory Visit (HOSPITAL_BASED_OUTPATIENT_CLINIC_OR_DEPARTMENT_OTHER): Payer: Self-pay

## 2023-11-28 ENCOUNTER — Other Ambulatory Visit (HOSPITAL_BASED_OUTPATIENT_CLINIC_OR_DEPARTMENT_OTHER): Payer: Self-pay

## 2023-11-28 MED ORDER — AMPHETAMINE-DEXTROAMPHET ER 10 MG PO CP24
10.0000 mg | ORAL_CAPSULE | Freq: Every day | ORAL | 0 refills | Status: DC
Start: 2023-11-28 — End: 2024-05-06
  Filled 2023-11-28 – 2023-12-22 (×2): qty 30, 30d supply, fill #0

## 2023-12-05 ENCOUNTER — Encounter: Payer: Self-pay | Admitting: Family Medicine

## 2023-12-05 ENCOUNTER — Other Ambulatory Visit: Payer: Self-pay | Admitting: Medical Genetics

## 2023-12-05 ENCOUNTER — Ambulatory Visit: Payer: Self-pay | Admitting: Family Medicine

## 2023-12-05 VITALS — BP 119/69 | HR 48 | Temp 98.5°F | Ht 72.0 in | Wt 191.0 lb

## 2023-12-05 DIAGNOSIS — L989 Disorder of the skin and subcutaneous tissue, unspecified: Secondary | ICD-10-CM

## 2023-12-05 DIAGNOSIS — F339 Major depressive disorder, recurrent, unspecified: Secondary | ICD-10-CM | POA: Diagnosis not present

## 2023-12-05 DIAGNOSIS — Z113 Encounter for screening for infections with a predominantly sexual mode of transmission: Secondary | ICD-10-CM

## 2023-12-05 DIAGNOSIS — F1090 Alcohol use, unspecified, uncomplicated: Secondary | ICD-10-CM

## 2023-12-05 DIAGNOSIS — Z903 Acquired absence of stomach [part of]: Secondary | ICD-10-CM

## 2023-12-05 DIAGNOSIS — E663 Overweight: Secondary | ICD-10-CM | POA: Diagnosis not present

## 2023-12-05 DIAGNOSIS — E291 Testicular hypofunction: Secondary | ICD-10-CM

## 2023-12-05 DIAGNOSIS — G479 Sleep disorder, unspecified: Secondary | ICD-10-CM | POA: Insufficient documentation

## 2023-12-05 MED ORDER — TRIAMCINOLONE ACETONIDE 0.025 % EX OINT: 1.0000 | TOPICAL_OINTMENT | Freq: Two times a day (BID) | CUTANEOUS | 0 refills | Status: DC

## 2023-12-05 NOTE — Patient Instructions (Addendum)
 Mix steroid cream and thick tub lotion (Equate Eczema, Cetaphil, Cerave, Aquaphor, or Eucerin) and then applying to affected areas. Twice daily for two weeks.    [facility]  Phone: [phone] Address: [address] [place], [place] [address] Hours:Open 24/7, No appointment required.  [facility] ([facility] is centrally located in the [place] at [address], [place]

## 2023-12-05 NOTE — Progress Notes (Signed)
 New Patient Office Visit  Subjective   Patient ID: John Faulkner, male    DOB: 1986-01-09  Age: 38 y.o. MRN: [id number]  CC:  Chief Complaint  Patient presents with   New Patient (Initial Visit)    Establish care   HPI John Faulkner presents to establish care Patient moved to [facility] from [facility].  Patient states that he was previously overweight and that many of his issues have resolved since losing weight such as anal fissures and hypertension.   States that he feels that he is constantly fatigued and wonders if it is due to his depression or to low testosterone . He has noticed sexual dyfunction. Reports that he has lower interest and ability to perform. Notes that he previously followed with urology and received testosterone  supplementation. In March he was involuntarily committed.  Reports that he got very drunk and he was threatening self-harm. His ex-wife was present and had him involuntarily committed.  Reports at that time he was very drunk and tried to fight hospital staff. After discharge he was seeing online psychiatrist who was providing medication refills and he was started on Abilify and increased dose of lexapro . Denies thoughts of self harm today.  States that he continues to drink, but has cut back a lot. States that he does not get drunk anymore, but buzzed. Drinks daily currently. 375 ml of liquor, vodka.   In addition, he reports history of high risk homesexual behavior. Reports that he currently is in a monogamous homosexual relationship for the last 6 months. He would like STD screening.   States that he has a skin blemish on bilateral lips.  Reports that he used Aquafor on it and it would get better, then it would crack again. States that it would flake again.  Started 3 months ago.   Outpatient Encounter Medications as of 12/05/2023  Medication Sig   amphetamine -dextroamphetamine  (ADDERALL XR) 10 MG 24 hr capsule Take 1 capsule (10 mg total) by  mouth daily.   ARIPiprazole (ABILIFY) 2 MG tablet Take 2 mg by mouth at bedtime.   Cholecalciferol (VITAMIN D3) 250 MCG (10000 UT) TABS Take 10,000 Units by mouth daily.   escitalopram  (LEXAPRO ) 20 MG tablet Take 1 tablet (20 mg total) by mouth daily.   traZODone (DESYREL) 100 MG tablet 1 tablet at bedtime Orally Once a day, as needed   [DISCONTINUED] amphetamine -dextroamphetamine  (ADDERALL XR) 10 MG 24 hr capsule Take 1 capsule (10 mg total) by mouth daily.   [DISCONTINUED] clomiPHENE  (CLOMID ) 50 MG tablet Take 0.5 tablets (25 mg total) by mouth daily.   [DISCONTINUED] emtricitabine-tenofovir AF (DESCOVY ) 200-25 MG tablet Take 1 tablet by mouth daily.   No facility-administered encounter medications on file as of 12/05/2023.   Past Medical History:  Diagnosis Date   Acute calculous cholecystitis 08/13/2011   ADHD (attention deficit hyperactivity disorder)    takes aderall .02/05/2022   Anal fissure    Anxiety    Depression    History of hypertension    none since weight loss 2 yrs ago per pt on 02-06-2022   History of sleep apnea    none since weight loss 1 and 1/2 yrs ago   Hypogonadism in male    Nephrotic syndrome 67   age 14 yrs old resolved   Substance abuse (HCC) 08/2023   Alcohol-see [facility] records   Past Surgical History:  Procedure Laterality Date   abdominoplasty     ABDOMINOPLASTY  02/2022   CARPAL TUNNEL RELEASE  Left 2021   CHOLECYSTECTOMY  08/13/2011   Procedure: LAPAROSCOPIC CHOLECYSTECTOMY WITH INTRAOPERATIVE CHOLANGIOGRAM;  Surgeon: Gorman Laughter, DO;  Location: [facility] OR;  Service: General;  Laterality: N/A;      circumferential abdominoplasty     Sept 2023   COSMETIC SURGERY  03/06/2022   Circumferential Body Lift (Excess skin removal)   EVALUATION UNDER ANESTHESIA WITH HEMORRHOIDECTOMY N/A 02/16/2022   Procedure: EXAM UNDER ANESTHESIA WITH HEMORRHOIDECTOMY;  Surgeon: Candyce Champagne, MD;  Location: [facility];  Service: General;  Laterality:  N/A;   LAPAROSCOPIC GASTRIC SLEEVE RESECTION N/A 10/10/2020   Procedure: LAPAROSCOPIC GASTRIC SLEEVE RESECTION;  Surgeon: Adalberto Acton, MD;  Location: [facility];  Service: General;  Laterality: N/A;   MANDIBLE OSTEOTOMY N/A 06/10/2021   Procedure: MANDIBULAR OSTEOTOMY;  Surgeon: Wannetta Gutting, DMD;  Location: [facility] OR;  Service: Oral Surgery;  Laterality: N/A;   MAXILLA OSTEOTOMY N/A 06/10/2021   Procedure: LEFORT 1 MAXILLARY OSTEOTOMY;  Surgeon: Wannetta Gutting, DMD;  Location: [facility] OR;  Service: Oral Surgery;  Laterality: N/A;   SPHINCTEROTOMY N/A 02/16/2022   Procedure: INTERNAL SPHINCTEROTOMY;  Surgeon: Candyce Champagne, MD;  Location: [facility];  Service: General;  Laterality: N/A;   TONSILLECTOMY  06/26/1999   UPPER GI ENDOSCOPY N/A 10/10/2020   Procedure: UPPER GI ENDOSCOPY;  Surgeon: Adalberto Acton, MD;  Location: [facility];  Service: General;  Laterality: N/A;    Family History  Problem Relation Age of Onset   Pneumonia Mother        covid 85 pneumonia   Early death Mother    Varicose Veins Mother    COPD Father    Diabetes Father    Heart disease Father 2   Stroke Father    Colon polyps Father        in his late 59s   Arthritis Father    Asthma Father    Depression Father    Hypertension Father    Obesity Father    Diabetes Maternal Grandfather    Diabetes Paternal Grandfather    Drug abuse Sister    Colon cancer Neg Hx    Stomach cancer Neg Hx    Esophageal cancer Neg Hx    Pancreatic cancer Neg Hx    Social History   Socioeconomic History   Marital status: Legally Separated    Spouse name: Not on file   Number of children: Not on file   Years of education: Not on file   Highest education level: Bachelor's degree (e.g., BA, AB, BS)  Occupational History   Not on file  Tobacco Use   Smoking status: Never   Smokeless tobacco: Never  Vaping Use   Vaping status: Never Used  Substance and Sexual Activity   Alcohol use: No   Drug use: No    Sexual activity: Not on file  Other Topics Concern   Not on file  Social History Narrative   Work or School:       Home Situation: lives with wife, 2 children   Daughter 2014   Son 2018      Has a cat a Nurse, mental health      Spiritual Beliefs: none      Lifestyle: no regular exercise; poor      Works for a Tax inspector      Social Drivers of Health   Financial Resource Strain: Low Risk  (09/10/2023)   Overall Financial Resource Strain (CARDIA)    Difficulty of Paying  Living Expenses: Not hard at all  Food Insecurity: No Food Insecurity (09/10/2023)   Hunger Vital Sign    Worried About Running Out of Food in the Last Year: Never true    Ran Out of Food in the Last Year: Never true  Transportation Needs: No Transportation Needs (09/10/2023)   PRAPARE - Administrator, Civil Service (Medical): No    Lack of Transportation (Non-Medical): No  Physical Activity: Unknown (09/10/2023)   Exercise Vital Sign    Days of Exercise per Week: 0 days    Minutes of Exercise per Session: Not on file  Stress: No Stress Concern Present (09/10/2023)   [employer] of Occupational Health - Occupational Stress Questionnaire    Feeling of Stress : Only a little  Social Connections: Socially Isolated (09/10/2023)   Social Connection and Isolation Panel    Frequency of Communication with Friends and Family: More than three times a week    Frequency of Social Gatherings with Friends and Family: More than three times a week    Attends Religious Services: Never    Database administrator or Organizations: No    Attends Engineer, structural: Not on file    Marital Status: Separated  Intimate Partner Violence: Not At Risk (09/05/2023)   Received from [facility]   HITS    Over the last 12 months how often did your partner physically hurt you?: Never    Over the last 12 months how often did your partner insult you or talk down to you?: Never    Over the last 12 months  how often did your partner threaten you with physical harm?: Never    Over the last 12 months how often did your partner scream or curse at you?: Never    ROS As per HPI   Objective   BP 119/69   Pulse (!) 48   Temp 98.5 F (36.9 C)   Ht 6' (1.829 m)   Wt 191 lb (86.6 kg)   SpO2 98%   BMI 25.90 kg/m   Physical Exam Constitutional:      General: He is awake. He is not in acute distress.    Appearance: Normal appearance. He is well-developed, well-groomed and overweight. He is not ill-appearing, toxic-appearing or diaphoretic.   Cardiovascular:     Rate and Rhythm: Normal rate and regular rhythm.     Pulses: Normal pulses.          Radial pulses are 2+ on the right side and 2+ on the left side.       Posterior tibial pulses are 2+ on the right side and 2+ on the left side.     Heart sounds: Normal heart sounds. No murmur heard.    No gallop.  Pulmonary:     Effort: Pulmonary effort is normal. No respiratory distress.     Breath sounds: Normal breath sounds. No stridor. No wheezing, rhonchi or rales.   Musculoskeletal:     Cervical back: Full passive range of motion without pain and neck supple.     Right lower leg: No edema.     Left lower leg: No edema.   Skin:    General: Skin is warm.     Capillary Refill: Capillary refill takes less than 2 seconds.     Comments: Erythematous, dry, flaking skin lesion along bilateral labial commissures    Neurological:     General: No focal deficit present.     Mental Status:  He is alert, oriented to person, place, and time and easily aroused. Mental status is at baseline.     GCS: GCS eye subscore is 4. GCS verbal subscore is 5. GCS motor subscore is 6.     Motor: No weakness.   Psychiatric:        Attention and Perception: Attention and perception normal.        Mood and Affect: Mood and affect normal.        Speech: Speech normal.        Behavior: Behavior normal. Behavior is cooperative.        Thought Content: Thought  content normal. Thought content does not include homicidal or suicidal ideation. Thought content does not include homicidal or suicidal plan.        Cognition and Memory: Cognition and memory normal.        Judgment: Judgment normal.       12/05/2023    8:49 AM 02/18/2023    8:26 AM 08/06/2022    8:58 AM  Depression screen PHQ 2/9  Decreased Interest 2 0 1  Down, Depressed, Hopeless 1 0 1  PHQ - 2 Score 3 0 2  Altered sleeping 2 0 3  Tired, decreased energy 2 0 1  Change in appetite 0 0 0  Feeling bad or failure about yourself  1 0 3  Trouble concentrating 0 0 1  Moving slowly or fidgety/restless 0 0 0  Suicidal thoughts 0 0 0  PHQ-9 Score 8 0 10  Difficult doing work/chores Somewhat difficult Not difficult at all Somewhat difficult      12/05/2023    8:49 AM 08/06/2022    8:58 AM 01/29/2022    9:05 AM  GAD 7 : Generalized Anxiety Score  Nervous, Anxious, on Edge 0 1 0  Control/stop worrying 0 1 0  Worry too much - different things 0 1 0  Trouble relaxing 0 1 0  Restless 0 0 0  Easily annoyed or irritable 0 0 0  Afraid - awful might happen 0 0 0  Total GAD 7 Score 0 4 0  Anxiety Difficulty  Somewhat difficult Not difficult at all   Assessment & Plan:  1. Depression, recurrent (HCC) (Primary) Discussed with patient that given his history and current use of alcohol that I recommend he follow up with specialty. He is understanding. Referral placed. Provided him with [facility] urgent care information. Denies thoughts of self harm at this time. Safety contract established.  - Ambulatory referral to Psychiatry  2. Hypogonadism in male Labs as below. Will communicate results to patient once available. Will await results to determine next steps. Patient previously established with urology.  - Testosterone   3. Overweight (BMI 25.0-29.9) Discussed with patient to continue healthy lifestyle choices, including diet (rich in fruits, vegetables, and lean proteins, and low in salt and simple  carbohydrates) and exercise (at least 30 minutes of moderate physical activity daily). Limit beverages high is sugar. Recommended at least 80-100 oz of water daily.   4. S/P gastric sleeve procedure As above.   5. Alcohol use disorder Encouraged patient to reduce intake. Discussed how it impacts his medications and psychological care.   6. Screening examination for STD (sexually transmitted disease) Labs as below. Will communicate results to patient once available. Will await results to determine next steps.  - RPR - Ct Ng M genitalium NAA, Urine - HSV 1 and 2 Ab, IgG - HepB+HepC+HIV Panel  7. Skin lesion Discussed mixing steroid cream  and thick tub lotion (Equate Eczema, Cetaphil, Cerave, Aquaphor, or Eucerin) and then applying to affected areas. - triamcinolone (KENALOG) 0.025 % ointment; Apply 1 Application topically 2 (two) times daily.  Dispense: 30 g; Refill: 0  The above assessment and management plan was discussed with the patient. The patient verbalized understanding of and has agreed to the management plan using shared-decision making. Patient is aware to call the clinic if they develop any new symptoms or if symptoms fail to improve or worsen. Patient is aware when to return to the clinic for a follow-up visit. Patient educated on when it is appropriate to go to the emergency department.   Return if symptoms worsen or fail to improve.   Jacqualyn Mates, DNP-FNP [facility] [address] [place], [place] [address] [phone]

## 2023-12-06 LAB — HEPB+HEPC+HIV PANEL
HIV Screen 4th Generation wRfx: NONREACTIVE
Hep B C IgM: NEGATIVE
Hep B Core Total Ab: NEGATIVE
Hep B E Ab: NONREACTIVE
Hep B E Ag: NEGATIVE
Hep B Surface Ab, Qual: NONREACTIVE
Hep C Virus Ab: NONREACTIVE
Hepatitis B Surface Ag: NEGATIVE

## 2023-12-06 LAB — HSV 1 AND 2 AB, IGG
HSV 1 Glycoprotein G Ab, IgG: NONREACTIVE
HSV 2 IgG, Type Spec: NONREACTIVE

## 2023-12-06 LAB — RPR: RPR Ser Ql: NONREACTIVE

## 2023-12-06 LAB — TESTOSTERONE: Testosterone: 832 ng/dL (ref 264–916)

## 2023-12-06 LAB — CT NG M GENITALIUM NAA, URINE
Chlamydia trachomatis, NAA: NEGATIVE
Mycoplasma genitalium NAA: NEGATIVE
Neisseria gonorrhoeae, NAA: NEGATIVE

## 2023-12-09 ENCOUNTER — Other Ambulatory Visit (HOSPITAL_COMMUNITY)

## 2023-12-09 ENCOUNTER — Other Ambulatory Visit (HOSPITAL_BASED_OUTPATIENT_CLINIC_OR_DEPARTMENT_OTHER): Payer: Self-pay

## 2023-12-09 ENCOUNTER — Ambulatory Visit: Payer: Self-pay | Admitting: Family Medicine

## 2023-12-16 ENCOUNTER — Ambulatory Visit

## 2023-12-23 ENCOUNTER — Other Ambulatory Visit (HOSPITAL_COMMUNITY)

## 2023-12-23 ENCOUNTER — Other Ambulatory Visit (HOSPITAL_BASED_OUTPATIENT_CLINIC_OR_DEPARTMENT_OTHER): Payer: Self-pay

## 2023-12-23 ENCOUNTER — Ambulatory Visit

## 2024-01-16 ENCOUNTER — Other Ambulatory Visit

## 2024-01-16 DIAGNOSIS — Z006 Encounter for examination for normal comparison and control in clinical research program: Secondary | ICD-10-CM

## 2024-01-22 ENCOUNTER — Other Ambulatory Visit: Payer: Self-pay | Admitting: Family

## 2024-01-28 ENCOUNTER — Other Ambulatory Visit (HOSPITAL_BASED_OUTPATIENT_CLINIC_OR_DEPARTMENT_OTHER): Payer: Self-pay

## 2024-01-28 ENCOUNTER — Encounter (HOSPITAL_BASED_OUTPATIENT_CLINIC_OR_DEPARTMENT_OTHER): Payer: Self-pay

## 2024-02-01 LAB — GENECONNECT MOLECULAR SCREEN: Genetic Analysis Overall Interpretation: NEGATIVE

## 2024-02-28 ENCOUNTER — Ambulatory Visit: Admitting: Family

## 2024-03-02 ENCOUNTER — Ambulatory Visit: Admitting: Family

## 2024-03-06 ENCOUNTER — Ambulatory Visit: Admitting: Family Medicine

## 2024-03-12 ENCOUNTER — Other Ambulatory Visit (HOSPITAL_BASED_OUTPATIENT_CLINIC_OR_DEPARTMENT_OTHER): Payer: Self-pay

## 2024-05-06 ENCOUNTER — Ambulatory Visit: Admitting: Family

## 2024-05-06 ENCOUNTER — Other Ambulatory Visit (HOSPITAL_BASED_OUTPATIENT_CLINIC_OR_DEPARTMENT_OTHER): Payer: Self-pay

## 2024-05-06 ENCOUNTER — Telehealth: Payer: Self-pay | Admitting: Family

## 2024-05-06 VITALS — BP 127/60 | HR 60 | Temp 98.5°F | Resp 16 | Ht 72.0 in | Wt 199.0 lb

## 2024-05-06 DIAGNOSIS — Z23 Encounter for immunization: Secondary | ICD-10-CM | POA: Diagnosis not present

## 2024-05-06 DIAGNOSIS — F909 Attention-deficit hyperactivity disorder, unspecified type: Secondary | ICD-10-CM | POA: Diagnosis not present

## 2024-05-06 DIAGNOSIS — F339 Major depressive disorder, recurrent, unspecified: Secondary | ICD-10-CM | POA: Diagnosis not present

## 2024-05-06 DIAGNOSIS — G4700 Insomnia, unspecified: Secondary | ICD-10-CM

## 2024-05-06 MED ORDER — TRAZODONE HCL 100 MG PO TABS
100.0000 mg | ORAL_TABLET | Freq: Every evening | ORAL | 2 refills | Status: AC | PRN
  Filled 2024-05-06: qty 30, 30d supply, fill #0

## 2024-05-06 MED ORDER — AMPHETAMINE-DEXTROAMPHET ER 10 MG PO CP24
10.0000 mg | ORAL_CAPSULE | Freq: Every day | ORAL | 0 refills | Status: DC
  Filled 2024-05-06: qty 30, 30d supply, fill #0

## 2024-05-06 MED ORDER — BUPROPION HCL ER (XL) 150 MG PO TB24
150.0000 mg | ORAL_TABLET | Freq: Every day | ORAL | 2 refills | Status: AC
  Filled 2024-05-06: qty 30, 30d supply, fill #0
  Filled 2024-06-03: qty 30, 30d supply, fill #1
  Filled 2024-07-06: qty 30, 30d supply, fill #2

## 2024-05-06 NOTE — Telephone Encounter (Signed)
 See mychart.

## 2024-05-06 NOTE — Assessment & Plan Note (Signed)
 ADHD symptoms worsened, contributing to anxiety and panic attacks. Previous Adderall XR effective but discontinued. Anxiety may improve with ADHD control. - Restarted Adderall XR 10 mg daily. - Updated urine drug screen. - Updated controlled substance contract. - Evaluate response to Adderall in one month.

## 2024-05-06 NOTE — Patient Instructions (Signed)
  VISIT SUMMARY: Today, we discussed your worsening depression and anxiety, which have been affecting your daily activities and interactions with your children. We also addressed your ADHD symptoms, which have been contributing to your anxiety and panic attacks. Additionally, we reviewed your intermittent insomnia and general health maintenance needs.  YOUR PLAN: -ATTENTION-DEFICIT HYPERACTIVITY DISORDER (ADHD): ADHD is a condition that affects your ability to focus and can cause hyperactive and impulsive behavior. We have restarted your Adderall XR at 10 mg daily to help manage your symptoms. We will evaluate your response to this medication in one month.  -MAJOR DEPRESSIVE DISORDER WITH COMORBID ANXIETY: Major depressive disorder is a condition characterized by persistent feelings of sadness and loss of interest, while anxiety involves excessive worry. We have decided to restart Wellbutrin in the morning to complement your current Lexapro  20 mg daily. Please continue your therapy sessions, and we will re-evaluate your condition in one month.  -INSOMNIA: Insomnia is a condition where you have trouble falling or staying asleep. We have refilled your prescription for trazodone to use as needed to help manage your sleep issues.  -GENERAL HEALTH MAINTENANCE: We administered your flu shot and the first dose of the hepatitis B vaccine today. Please schedule the second dose of the hepatitis B vaccine in one month.  INSTRUCTIONS: Please follow up in one month to evaluate your response to the medications and to receive the second dose of the hepatitis B vaccine.                                                    Contains text generated by [employer].

## 2024-05-06 NOTE — Assessment & Plan Note (Signed)
  Depression symptoms include anhedonia and feeling overwhelmed. Anxiety exacerbated by ADHD. Current Lexapro  20 mg daily. Previous Wellbutrin and Abilify ineffective. Wellbutrin may aid focus. - Start Wellbutrin XL 150 mg in the morning to complement Lexapro . - Continue Lexapro  20 mg daily. - Continue therapy sessions. - Re-evaluate in one month.

## 2024-05-06 NOTE — Progress Notes (Signed)
 Subjective:     Patient ID: John Faulkner, male    DOB: 11-13-1985, 38 y.o.   MRN: [id number]  Chief Complaint  Patient presents with   Follow-up    Here to re-establish care   ADHD    Will need medication for this   Depression    Here for follow up    HPI  Discussed the use of AI scribe software for clinical note transcription with the patient, who gave verbal consent to proceed.  History of Present Illness John Faulkner is a 38 year old male with depression and ADHD who presents with worsening depression and anxiety.  He has been experiencing worsening depression and anxiety, characterized by anhedonia in activities he previously enjoyed, such as listening to music and gardening. He finds it difficult to engage in activities with his children and feels overwhelmed by their school events. His symptoms of depression have been present since at least 2017-2018, when he was first diagnosed and treated with Wellbutrin, which was later changed to Celexa and then to Lexapro , currently taken at 20 mg daily.  He experiences increased anxiety, particularly in stressful situations at work, which he attributes to his ADHD. He describes episodes of diaphoresis and sensations akin to panic attacks. These symptoms began after he stopped taking Adderall regularly. He uses leftover 5 mg Adderall pills on particularly stressful days, which he finds helpful in the short term.  He has tried other medications for his symptoms, including Buspar and Abilify, but these did not help and sometimes worsened his condition. He occasionally uses trazodone for sleep, which he finds effective, although his prescription expired in 2022.  In terms of his social history, he recently moved back to [place] after living in [place]. He has been in a new relationship for a little over three months and has two children, aged eleven and seven. His children have adjusted well to the changes.  No thoughts of  self-harm or harm to others.      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   Nephrotic syndrome 1989   age 47 yrs old resolved   Substance abuse (HCC) 08/2023   Alcohol-see [facility] records    Past Surgical History:  Procedure Laterality Date   abdominoplasty     ABDOMINOPLASTY  02/2022   CARPAL TUNNEL RELEASE Left 2021   CHOLECYSTECTOMY  08/13/2011   Procedure: LAPAROSCOPIC CHOLECYSTECTOMY WITH INTRAOPERATIVE CHOLANGIOGRAM;  Surgeon: Redell Alm Faith, DO;  Location: [facility] OR;  Service: General;  Laterality: N/A;      circumferential abdominoplasty     Sept 2023   COSMETIC SURGERY  03/06/2022   Circumferential Body Lift (Excess skin removal)   EVALUATION UNDER ANESTHESIA WITH HEMORRHOIDECTOMY N/A 02/16/2022   Procedure: EXAM UNDER ANESTHESIA WITH HEMORRHOIDECTOMY;  Surgeon: Sheldon Standing, MD;  Location: [facility] [place];  Service: General;  Laterality: N/A;  LAPAROSCOPIC GASTRIC SLEEVE RESECTION N/A 10/10/2020   Procedure: LAPAROSCOPIC GASTRIC SLEEVE RESECTION;  Surgeon: Signe Mitzie LABOR, MD;  Location: [facility];  Service: General;  Laterality: N/A;   MANDIBLE OSTEOTOMY N/A 06/10/2021   Procedure: MANDIBULAR OSTEOTOMY;  Surgeon: Helga Bettyann SQUIBB, DMD;  Location: [facility] OR;  Service: Oral Surgery;  Laterality: N/A;   MAXILLA OSTEOTOMY N/A 06/10/2021   Procedure: LEFORT 1 MAXILLARY OSTEOTOMY;  Surgeon: Helga Bettyann SQUIBB, DMD;  Location: [facility] OR;  Service: Oral Surgery;   Laterality: N/A;   SPHINCTEROTOMY N/A 02/16/2022   Procedure: INTERNAL SPHINCTEROTOMY;  Surgeon: Sheldon Standing, MD;  Location: [facility];  Service: General;  Laterality: N/A;   TONSILLECTOMY  06/26/1999   UPPER GI ENDOSCOPY N/A 10/10/2020   Procedure: UPPER GI ENDOSCOPY;  Surgeon: Signe Mitzie LABOR, MD;  Location: [facility];  Service: General;  Laterality: N/A;    Family History  Problem Relation Age of Onset   Pneumonia Mother        covid 8 pneumonia   Early death Mother    Varicose Veins Mother    COPD Father    Diabetes Father    Heart disease Father 107   Stroke Father    Colon polyps Father        in his late 20s   Arthritis Father    Asthma Father    Depression Father    Hypertension Father    Obesity Father    Diabetes Maternal Grandfather    Diabetes Paternal Grandfather    Drug abuse Sister    Colon cancer Neg Hx    Stomach cancer Neg Hx    Esophageal cancer Neg Hx    Pancreatic cancer Neg Hx     Social History   Socioeconomic History   Marital status: Divorced    Spouse name: Not on file   Number of children: Not on file   Years of education: Not on file   Highest education level: Bachelor's degree (e.g., BA, AB, BS)  Occupational History   Not on file  Tobacco Use   Smoking status: Never   Smokeless tobacco: Never  Vaping Use   Vaping status: Never Used  Substance and Sexual Activity   Alcohol use: No   Drug use: No   Sexual activity: Not on file  Other Topics Concern   Not on file  Social History Narrative   Work or School:       Home Situation: lives with wife, 2 children   Daughter 2014   Son 2018      Has a cat a nurse, mental health      Spiritual Beliefs: none      Lifestyle: no regular exercise; poor      Works for a tax inspector      Social Drivers of Corporate Investment Banker Strain: Low Risk  (05/05/2024)   Overall Financial Resource Strain (CARDIA)    Difficulty of Paying Living Expenses: Not hard at  all  Food Insecurity: No Food Insecurity (05/05/2024)   Hunger Vital Sign    Worried About Running Out of Food in the Last Year: Never true    Ran Out of Food in the Last Year: Never true  Transportation Needs: No Transportation Needs (05/05/2024)   PRAPARE - Administrator, Civil Service (Medical): No    Lack of Transportation (Non-Medical): No  Physical Activity: Inactive (05/05/2024)   Exercise Vital Sign    Days of Exercise per Week: 0 days  Minutes of Exercise per Session: Not on file  Stress: Stress Concern Present (05/05/2024)   [employer] of Occupational Health - Occupational Stress Questionnaire    Feeling of Stress: To some extent  Social Connections: Socially Isolated (05/05/2024)   Social Connection and Isolation Panel    Frequency of Communication with Friends and Family: Once a week    Frequency of Social Gatherings with Friends and Family: Once a week    Attends Religious Services: Never    Database Administrator or Organizations: No    Attends Engineer, Structural: Not on file    Marital Status: Divorced  Intimate Partner Violence: Not At Risk (09/05/2023)   Received from [facility]   HITS    Over the last 12 months how often did your partner physically hurt you?: Never    Over the last 12 months how often did your partner insult you or talk down to you?: Never    Over the last 12 months how often did your partner threaten you with physical harm?: Never    Over the last 12 months how often did your partner scream or curse at you?: Never    Outpatient Medications Prior to Visit  Medication Sig Dispense Refill   Cholecalciferol (VITAMIN D3) 250 MCG (10000 UT) TABS Take 10,000 Units by mouth daily.     escitalopram  (LEXAPRO ) 20 MG tablet Take 1 tablet (20 mg total) by mouth daily. 90 tablet 3   traZODone (DESYREL) 100 MG tablet 1 tablet at bedtime Orally Once a day, as needed     amphetamine -dextroamphetamine  (ADDERALL XR) 10 MG 24 hr  capsule Take 1 capsule (10 mg total) by mouth daily. (Patient not taking: Reported on 05/06/2024) 30 capsule 0   ARIPiprazole (ABILIFY) 2 MG tablet Take 2 mg by mouth at bedtime.     triamcinolone  (KENALOG ) 0.025 % ointment Apply 1 Application topically 2 (two) times daily. 30 g 0   No facility-administered medications prior to visit.    No Known Allergies  ROS    See HPI Objective:    Physical Exam Constitutional:      General: He is not in acute distress.    Appearance: He is well-developed.  HENT:     Head: Normocephalic and atraumatic.  Cardiovascular:     Rate and Rhythm: Normal rate and regular rhythm.     Heart sounds: No murmur heard. Pulmonary:     Effort: Pulmonary effort is normal. No respiratory distress.     Breath sounds: Normal breath sounds. No wheezing or rales.  Skin:    General: Skin is warm and dry.  Neurological:     Mental Status: He is alert and oriented to person, place, and time.  Psychiatric:        Behavior: Behavior normal.        Thought Content: Thought content normal.      BP 127/60 (BP Location: Right Arm, Patient Position: Sitting, Cuff Size: Normal)   Pulse 60   Temp 98.5 F (36.9 C) (Oral)   Resp 16   Ht 6' (1.829 m)   Wt 199 lb (90.3 kg)   SpO2 99%   BMI 26.99 kg/m  Wt Readings from Last 3 Encounters:  05/06/24 199 lb (90.3 kg)  12/05/23 191 lb (86.6 kg)  09/13/23 194 lb (88 kg)       Assessment & Plan:   Problem List Items Addressed This Visit       Unprioritized   Depression, recurrent  Depression symptoms include anhedonia and feeling overwhelmed. Anxiety exacerbated by ADHD. Current Lexapro  20 mg daily. Previous Wellbutrin and Abilify ineffective. Wellbutrin may aid focus. - Start Wellbutrin XL 150 mg in the morning to complement Lexapro . - Continue Lexapro  20 mg daily. - Continue therapy sessions. - Re-evaluate in one month.      Relevant Medications   buPROPion (WELLBUTRIN XL) 150 MG 24 hr tablet    traZODone (DESYREL) 100 MG tablet   ADHD (attention deficit hyperactivity disorder) - Primary   ADHD symptoms worsened, contributing to anxiety and panic attacks. Previous Adderall XR effective but discontinued. Anxiety may improve with ADHD control. - Restarted Adderall XR 10 mg daily. - Updated urine drug screen. - Updated controlled substance contract. - Evaluate response to Adderall in one month.      Relevant Medications   amphetamine -dextroamphetamine  (ADDERALL XR) 10 MG 24 hr capsule   Other Relevant Orders   DRUG MONITORING, PANEL 8 WITH CONFIRMATION, URINE   Other Visit Diagnoses       Insomnia, unspecified type       Relevant Medications   traZODone (DESYREL) 100 MG tablet     Immunization due       Relevant Orders   Flu vaccine trivalent PF, 6mos and older(Flulaval,Afluria,Fluarix,Fluzone) (Completed)   Heplisav-B (HepB-CPG) Vaccine (Completed)      Assessment and Plan Assessment & Plan      I have discontinued Luby P. Curro Don's ARIPiprazole and triamcinolone . I have also changed his traZODone. Additionally, I am having him start on buPROPion. Lastly, I am having him maintain his escitalopram , Vitamin D3, and amphetamine -dextroamphetamine .  Meds ordered this encounter  Medications   buPROPion (WELLBUTRIN XL) 150 MG 24 hr tablet    Sig: Take 1 tablet (150 mg total) by mouth daily.    Dispense:  30 tablet    Refill:  2    Supervising Provider:   DOMENICA BLACKBIRD A [[id number]]   amphetamine -dextroamphetamine  (ADDERALL XR) 10 MG 24 hr capsule    Sig: Take 1 capsule (10 mg total) by mouth daily.    Dispense:  30 capsule    Refill:  0    Supervising Provider:   DOMENICA BLACKBIRD A [[id number]]   traZODone (DESYREL) 100 MG tablet    Sig: Take 1 tablet (100 mg total) by mouth at bedtime as needed for sleep.    Dispense:  30 tablet    Refill:  2    Supervising Provider:   DOMENICA BLACKBIRD A [[id number]]

## 2024-05-07 LAB — DRUG MONITORING, PANEL 8 WITH CONFIRMATION, URINE
6 Acetylmorphine: NEGATIVE ng/mL (ref <10)
Alcohol Metabolites: NEGATIVE ng/mL (ref <500)
Amphetamines: NEGATIVE ng/mL (ref <500)
Benzodiazepines: NEGATIVE ng/mL (ref <100)
Buprenorphine, Urine: NEGATIVE ng/mL (ref <5)
Cocaine Metabolite: NEGATIVE ng/mL (ref <150)
Creatinine: 131.6 mg/dL (ref >=20.0)
MDMA: NEGATIVE ng/mL (ref <500)
Marijuana Metabolite: NEGATIVE ng/mL (ref <20)
Opiates: NEGATIVE ng/mL (ref <100)
Oxidant: NEGATIVE ug/mL (ref <200)
Oxycodone: NEGATIVE ng/mL (ref <100)
pH: 6.1 (ref 4.5–9.0)

## 2024-05-07 LAB — DM TEMPLATE

## 2024-05-08 DIAGNOSIS — Z630 Problems in relationship with spouse or partner: Secondary | ICD-10-CM | POA: Diagnosis not present

## 2024-05-08 DIAGNOSIS — F330 Major depressive disorder, recurrent, mild: Secondary | ICD-10-CM | POA: Diagnosis not present

## 2024-05-13 ENCOUNTER — Encounter (HOSPITAL_COMMUNITY): Payer: Self-pay | Admitting: *Deleted

## 2024-05-18 DIAGNOSIS — F331 Major depressive disorder, recurrent, moderate: Secondary | ICD-10-CM | POA: Diagnosis not present

## 2024-06-03 ENCOUNTER — Other Ambulatory Visit: Payer: Self-pay

## 2024-06-03 ENCOUNTER — Other Ambulatory Visit: Payer: Self-pay | Admitting: Family

## 2024-06-03 DIAGNOSIS — F909 Attention-deficit hyperactivity disorder, unspecified type: Secondary | ICD-10-CM

## 2024-06-04 ENCOUNTER — Other Ambulatory Visit: Payer: Self-pay

## 2024-06-04 ENCOUNTER — Other Ambulatory Visit (HOSPITAL_BASED_OUTPATIENT_CLINIC_OR_DEPARTMENT_OTHER): Payer: Self-pay

## 2024-06-04 MED ORDER — AMPHETAMINE-DEXTROAMPHET ER 10 MG PO CP24
10.0000 mg | ORAL_CAPSULE | Freq: Every day | ORAL | 0 refills | Status: DC
  Filled 2024-06-04: qty 30, 30d supply, fill #0

## 2024-06-05 DIAGNOSIS — F331 Major depressive disorder, recurrent, moderate: Secondary | ICD-10-CM | POA: Diagnosis not present

## 2024-06-12 ENCOUNTER — Encounter: Payer: Self-pay | Admitting: Family

## 2024-06-12 ENCOUNTER — Ambulatory Visit: Admitting: Family

## 2024-07-06 ENCOUNTER — Other Ambulatory Visit (HOSPITAL_BASED_OUTPATIENT_CLINIC_OR_DEPARTMENT_OTHER): Payer: Self-pay

## 2024-07-06 ENCOUNTER — Other Ambulatory Visit: Payer: Self-pay

## 2024-07-06 ENCOUNTER — Other Ambulatory Visit: Payer: Self-pay | Admitting: Family

## 2024-07-06 DIAGNOSIS — F909 Attention-deficit hyperactivity disorder, unspecified type: Secondary | ICD-10-CM

## 2024-07-06 MED ORDER — AMPHETAMINE-DEXTROAMPHET ER 10 MG PO CP24
10.0000 mg | ORAL_CAPSULE | Freq: Every day | ORAL | 0 refills | Status: AC
  Filled 2024-07-06: qty 30, 30d supply, fill #0

## 2024-07-07 ENCOUNTER — Ambulatory Visit: Admitting: Family

## 2024-07-17 ENCOUNTER — Ambulatory Visit: Admitting: Family

## 2024-08-07 ENCOUNTER — Ambulatory Visit: Admitting: Family
